# Patient Record
Sex: Female | Born: 1960
Health system: Southern US, Community
[De-identification: ages and names within clinical notes are randomized; demographics above are authoritative.]

## PROBLEM LIST (undated history)

## (undated) DIAGNOSIS — G2581 Restless legs syndrome: Secondary | ICD-10-CM

## (undated) DIAGNOSIS — F419 Anxiety disorder, unspecified: Secondary | ICD-10-CM

## (undated) DIAGNOSIS — M1990 Unspecified osteoarthritis, unspecified site: Secondary | ICD-10-CM

## (undated) DIAGNOSIS — M351 Other overlap syndromes: Secondary | ICD-10-CM

## (undated) HISTORY — DX: Other overlap syndromes: M35.1

## (undated) HISTORY — DX: Restless legs syndrome: G25.81

## (undated) HISTORY — DX: Anxiety disorder, unspecified: F41.9

## (undated) HISTORY — DX: Unspecified osteoarthritis, unspecified site: M19.90

---

## 2000-01-09 ENCOUNTER — Emergency Department (HOSPITAL_COMMUNITY): Admission: EM | Admit: 2000-01-09 | Discharge: 2000-01-09 | Payer: Self-pay | Admitting: Emergency Medicine

## 2000-01-09 ENCOUNTER — Encounter: Payer: Self-pay | Admitting: Emergency Medicine

## 2000-01-19 ENCOUNTER — Emergency Department (HOSPITAL_COMMUNITY): Admission: EM | Admit: 2000-01-19 | Discharge: 2000-01-19 | Payer: Self-pay | Admitting: Emergency Medicine

## 2001-12-05 ENCOUNTER — Emergency Department (HOSPITAL_COMMUNITY): Admission: EM | Admit: 2001-12-05 | Discharge: 2001-12-06 | Payer: Self-pay | Admitting: Emergency Medicine

## 2001-12-05 ENCOUNTER — Encounter: Payer: Self-pay | Admitting: Emergency Medicine

## 2001-12-06 ENCOUNTER — Encounter: Payer: Self-pay | Admitting: Emergency Medicine

## 2002-01-17 ENCOUNTER — Encounter: Payer: Self-pay | Admitting: Emergency Medicine

## 2002-01-17 ENCOUNTER — Emergency Department (HOSPITAL_COMMUNITY): Admission: EM | Admit: 2002-01-17 | Discharge: 2002-01-17 | Payer: Self-pay | Admitting: Emergency Medicine

## 2002-01-19 ENCOUNTER — Emergency Department (HOSPITAL_COMMUNITY): Admission: EM | Admit: 2002-01-19 | Discharge: 2002-01-19 | Payer: Self-pay | Admitting: Emergency Medicine

## 2002-05-20 ENCOUNTER — Emergency Department (HOSPITAL_COMMUNITY): Admission: EM | Admit: 2002-05-20 | Discharge: 2002-05-20 | Payer: Self-pay | Admitting: Emergency Medicine

## 2002-08-24 ENCOUNTER — Emergency Department (HOSPITAL_COMMUNITY): Admission: EM | Admit: 2002-08-24 | Discharge: 2002-08-24 | Payer: Self-pay | Admitting: Emergency Medicine

## 2002-08-24 ENCOUNTER — Encounter: Payer: Self-pay | Admitting: Emergency Medicine

## 2002-08-31 ENCOUNTER — Emergency Department (HOSPITAL_COMMUNITY): Admission: EM | Admit: 2002-08-31 | Discharge: 2002-08-31 | Payer: Self-pay | Admitting: Emergency Medicine

## 2002-09-17 ENCOUNTER — Encounter: Payer: Self-pay | Admitting: Orthopedic Surgery

## 2002-09-17 ENCOUNTER — Ambulatory Visit (HOSPITAL_COMMUNITY): Admission: RE | Admit: 2002-09-17 | Discharge: 2002-09-17 | Payer: Self-pay | Admitting: Orthopedic Surgery

## 2003-05-02 ENCOUNTER — Emergency Department (HOSPITAL_COMMUNITY): Admission: EM | Admit: 2003-05-02 | Discharge: 2003-05-02 | Payer: Self-pay | Admitting: Emergency Medicine

## 2003-07-07 ENCOUNTER — Emergency Department (HOSPITAL_COMMUNITY): Admission: EM | Admit: 2003-07-07 | Discharge: 2003-07-08 | Payer: Self-pay | Admitting: Emergency Medicine

## 2004-02-12 ENCOUNTER — Emergency Department (HOSPITAL_COMMUNITY): Admission: EM | Admit: 2004-02-12 | Discharge: 2004-02-12 | Payer: Self-pay | Admitting: Emergency Medicine

## 2004-08-07 ENCOUNTER — Emergency Department (HOSPITAL_COMMUNITY): Admission: EM | Admit: 2004-08-07 | Discharge: 2004-08-07 | Payer: Self-pay | Admitting: Emergency Medicine

## 2004-08-17 ENCOUNTER — Emergency Department (HOSPITAL_COMMUNITY): Admission: EM | Admit: 2004-08-17 | Discharge: 2004-08-17 | Payer: Self-pay | Admitting: Emergency Medicine

## 2004-09-23 ENCOUNTER — Emergency Department (HOSPITAL_COMMUNITY): Admission: EM | Admit: 2004-09-23 | Discharge: 2004-09-23 | Payer: Self-pay | Admitting: Emergency Medicine

## 2005-03-07 ENCOUNTER — Emergency Department (HOSPITAL_COMMUNITY): Admission: EM | Admit: 2005-03-07 | Discharge: 2005-03-07 | Payer: Self-pay | Admitting: Emergency Medicine

## 2005-05-19 ENCOUNTER — Emergency Department (HOSPITAL_COMMUNITY): Admission: EM | Admit: 2005-05-19 | Discharge: 2005-05-19 | Payer: Self-pay | Admitting: Emergency Medicine

## 2006-12-30 ENCOUNTER — Emergency Department (HOSPITAL_COMMUNITY): Admission: EM | Admit: 2006-12-30 | Discharge: 2006-12-31 | Payer: Self-pay | Admitting: Emergency Medicine

## 2007-09-02 ENCOUNTER — Emergency Department (HOSPITAL_COMMUNITY): Admission: EM | Admit: 2007-09-02 | Discharge: 2007-09-03 | Payer: Self-pay | Admitting: Certified Registered"

## 2007-09-03 ENCOUNTER — Ambulatory Visit: Payer: Self-pay | Admitting: Vascular Surgery

## 2007-09-03 ENCOUNTER — Ambulatory Visit (HOSPITAL_COMMUNITY): Admission: RE | Admit: 2007-09-03 | Discharge: 2007-09-03 | Payer: Self-pay | Admitting: Emergency Medicine

## 2007-09-03 ENCOUNTER — Encounter (INDEPENDENT_AMBULATORY_CARE_PROVIDER_SITE_OTHER): Payer: Self-pay | Admitting: Emergency Medicine

## 2007-09-06 ENCOUNTER — Emergency Department (HOSPITAL_COMMUNITY): Admission: EM | Admit: 2007-09-06 | Discharge: 2007-09-06 | Payer: Self-pay | Admitting: Emergency Medicine

## 2007-11-11 ENCOUNTER — Emergency Department (HOSPITAL_COMMUNITY): Admission: EM | Admit: 2007-11-11 | Discharge: 2007-11-11 | Payer: Self-pay | Admitting: Emergency Medicine

## 2008-02-01 ENCOUNTER — Emergency Department (HOSPITAL_COMMUNITY): Admission: EM | Admit: 2008-02-01 | Discharge: 2008-02-01 | Payer: Self-pay | Admitting: Emergency Medicine

## 2008-09-06 ENCOUNTER — Emergency Department (HOSPITAL_COMMUNITY): Admission: EM | Admit: 2008-09-06 | Discharge: 2008-09-06 | Payer: Self-pay | Admitting: Emergency Medicine

## 2009-07-27 ENCOUNTER — Emergency Department (HOSPITAL_COMMUNITY): Admission: EM | Admit: 2009-07-27 | Discharge: 2009-07-28 | Payer: Self-pay | Admitting: Emergency Medicine

## 2009-08-07 ENCOUNTER — Emergency Department (HOSPITAL_COMMUNITY): Admission: EM | Admit: 2009-08-07 | Discharge: 2009-08-07 | Payer: Self-pay | Admitting: Emergency Medicine

## 2009-12-02 ENCOUNTER — Emergency Department (HOSPITAL_COMMUNITY): Admission: EM | Admit: 2009-12-02 | Discharge: 2009-12-02 | Payer: Self-pay | Admitting: Emergency Medicine

## 2010-03-01 HISTORY — PX: OTHER SURGICAL HISTORY: SHX169

## 2010-03-03 ENCOUNTER — Emergency Department (HOSPITAL_COMMUNITY): Admission: EM | Admit: 2010-03-03 | Discharge: 2010-03-03 | Payer: Self-pay | Admitting: Emergency Medicine

## 2010-03-05 ENCOUNTER — Emergency Department (HOSPITAL_COMMUNITY): Admission: EM | Admit: 2010-03-05 | Discharge: 2010-03-05 | Payer: Self-pay | Admitting: Emergency Medicine

## 2010-08-13 LAB — DIFFERENTIAL
Basophils Absolute: 0 10*3/uL (ref 0.0–0.1)
Basophils Relative: 0 % (ref 0–1)
Basophils Relative: 0 % (ref 0–1)
Eosinophils Absolute: 0.1 10*3/uL (ref 0.0–0.7)
Monocytes Absolute: 1.1 10*3/uL — ABNORMAL HIGH (ref 0.1–1.0)
Monocytes Relative: 9 % (ref 3–12)
Neutro Abs: 6.5 10*3/uL (ref 1.7–7.7)
Neutrophils Relative %: 63 % (ref 43–77)
Neutrophils Relative %: 73 % (ref 43–77)

## 2010-08-13 LAB — POCT I-STAT, CHEM 8
Calcium, Ion: 0.94 mmol/L — ABNORMAL LOW (ref 1.12–1.32)
Calcium, Ion: 1.07 mmol/L — ABNORMAL LOW (ref 1.12–1.32)
Chloride: 105 mEq/L (ref 96–112)
Glucose, Bld: 121 mg/dL — ABNORMAL HIGH (ref 70–99)
HCT: 40 % (ref 36.0–46.0)
HCT: 42 % (ref 36.0–46.0)
Hemoglobin: 14.3 g/dL (ref 12.0–15.0)
Potassium: 3.8 mEq/L (ref 3.5–5.1)

## 2010-08-13 LAB — CBC
HCT: 40.9 % (ref 36.0–46.0)
Hemoglobin: 13.8 g/dL (ref 12.0–15.0)
MCH: 30.2 pg (ref 26.0–34.0)
MCHC: 33.5 g/dL (ref 30.0–36.0)
MCHC: 33.7 g/dL (ref 30.0–36.0)
RDW: 14 % (ref 11.5–15.5)

## 2010-08-20 LAB — URINALYSIS, ROUTINE W REFLEX MICROSCOPIC
Glucose, UA: NEGATIVE mg/dL
Leukocytes, UA: NEGATIVE
Specific Gravity, Urine: 1.031 — ABNORMAL HIGH (ref 1.005–1.030)
pH: 5.5 (ref 5.0–8.0)

## 2010-08-20 LAB — URINE MICROSCOPIC-ADD ON

## 2010-08-20 LAB — BASIC METABOLIC PANEL
Chloride: 100 mEq/L (ref 96–112)
Creatinine, Ser: 0.67 mg/dL (ref 0.4–1.2)
GFR calc Af Amer: >60 mL/min (ref >60)
GFR calc non Af Amer: >60 mL/min (ref >60)
Potassium: 3.5 mEq/L (ref 3.5–5.1)

## 2010-08-20 LAB — DIFFERENTIAL
Eosinophils Absolute: 0.2 10*3/uL (ref 0.0–0.7)
Lymphocytes Relative: 38 % (ref 12–46)
Lymphs Abs: 2.6 10*3/uL (ref 0.7–4.0)
Monocytes Relative: 10 % (ref 3–12)
Neutrophils Relative %: 49 % (ref 43–77)

## 2010-08-20 LAB — CBC
MCV: 87.3 fL (ref 78.0–100.0)
RBC: 4.51 MIL/uL (ref 3.87–5.11)
WBC: 6.9 10*3/uL (ref 4.0–10.5)

## 2010-09-22 ENCOUNTER — Inpatient Hospital Stay (HOSPITAL_COMMUNITY)
Admission: EM | Admit: 2010-09-22 | Discharge: 2010-09-25 | DRG: 313 | Disposition: A | Discharge status: Home / Self Care | Payer: Self-pay | Attending: Infectious Diseases | Admitting: Infectious Diseases

## 2010-09-22 ENCOUNTER — Emergency Department (HOSPITAL_COMMUNITY): Payer: Self-pay

## 2010-09-22 ENCOUNTER — Encounter: Payer: Self-pay | Admitting: Internal Medicine

## 2010-09-22 DIAGNOSIS — Z560 Unemployment, unspecified: Secondary | ICD-10-CM

## 2010-09-22 DIAGNOSIS — Z87891 Personal history of nicotine dependence: Secondary | ICD-10-CM

## 2010-09-22 DIAGNOSIS — E669 Obesity, unspecified: Secondary | ICD-10-CM | POA: Diagnosis present

## 2010-09-22 DIAGNOSIS — R079 Chest pain, unspecified: Secondary | ICD-10-CM

## 2010-09-22 DIAGNOSIS — M47817 Spondylosis without myelopathy or radiculopathy, lumbosacral region: Secondary | ICD-10-CM | POA: Diagnosis present

## 2010-09-22 DIAGNOSIS — J45909 Unspecified asthma, uncomplicated: Secondary | ICD-10-CM | POA: Diagnosis present

## 2010-09-22 DIAGNOSIS — Z7982 Long term (current) use of aspirin: Secondary | ICD-10-CM

## 2010-09-22 DIAGNOSIS — M47812 Spondylosis without myelopathy or radiculopathy, cervical region: Secondary | ICD-10-CM | POA: Diagnosis present

## 2010-09-22 DIAGNOSIS — R0789 Other chest pain: Principal | ICD-10-CM | POA: Diagnosis present

## 2010-09-22 DIAGNOSIS — H6690 Otitis media, unspecified, unspecified ear: Secondary | ICD-10-CM | POA: Diagnosis present

## 2010-09-22 LAB — PROTIME-INR: INR: 0.95 (ref 0.00–1.49)

## 2010-09-22 LAB — COMPREHENSIVE METABOLIC PANEL
ALT: 15 U/L (ref 0–35)
Albumin: 3.4 g/dL — ABNORMAL LOW (ref 3.5–5.2)
Alkaline Phosphatase: 52 U/L (ref 39–117)
BUN: 8 mg/dL (ref 6–23)
Chloride: 108 mEq/L (ref 96–112)
Glucose, Bld: 91 mg/dL (ref 70–99)
Potassium: 3.6 mEq/L (ref 3.5–5.1)
Total Bilirubin: 0.3 mg/dL (ref 0.3–1.2)

## 2010-09-22 LAB — POCT I-STAT, CHEM 8
Calcium, Ion: 1.13 mmol/L (ref 1.12–1.32)
Creatinine, Ser: 0.6 mg/dL (ref 0.4–1.2)
Glucose, Bld: 162 mg/dL — ABNORMAL HIGH (ref 70–99)
HCT: 39 % (ref 36.0–46.0)
Hemoglobin: 13.3 g/dL (ref 12.0–15.0)

## 2010-09-22 LAB — POCT CARDIAC MARKERS
CKMB, poc: 1.1 ng/mL (ref 1.0–8.0)
Myoglobin, poc: 52.2 ng/mL (ref 12–200)

## 2010-09-22 LAB — TSH: TSH: 1.522 u[IU]/mL (ref 0.350–4.500)

## 2010-09-22 LAB — HEMOGLOBIN A1C: Mean Plasma Glucose: 114 mg/dL (ref <117)

## 2010-09-22 LAB — CBC
HCT: 38.9 % (ref 36.0–46.0)
MCV: 86.3 fL (ref 78.0–100.0)
RBC: 4.51 MIL/uL (ref 3.87–5.11)
WBC: 7.6 10*3/uL (ref 4.0–10.5)

## 2010-09-22 LAB — CARDIAC PANEL(CRET KIN+CKTOT+MB+TROPI): CK, MB: 2.7 ng/mL (ref 0.3–4.0)

## 2010-09-22 LAB — BRAIN NATRIURETIC PEPTIDE: Pro B Natriuretic peptide (BNP): 44 pg/mL (ref 0.0–100.0)

## 2010-09-22 LAB — CK TOTAL AND CKMB (NOT AT ARMC)
Relative Index: INVALID (ref 0.0–2.5)
Total CK: 74 U/L (ref 7–177)

## 2010-09-22 LAB — DIFFERENTIAL
Lymphocytes Relative: 23 % (ref 12–46)
Lymphs Abs: 1.8 10*3/uL (ref 0.7–4.0)
Neutrophils Relative %: 69 % (ref 43–77)

## 2010-09-22 NOTE — H&P (Signed)
Hospital Admission Note Date: 09/22/2010  Patient name: Sonya Smith Medical record number: [id number] Date of birth: 13-Jan-1961 Age: 50 y.o. Gender: female PCP: No primary provider on file.  Medical Service: IM teaching service  Attending physician: Dr. Darlina Sicilian    Pager: Resident (R2/R3): Dr. Scot Dock    Pager: [phone] Resident (R1): Dr. Allena Katz     Pager: [phone]  Chief Complaint: Chest pain  History of Present Illness: 50 y/o woman with h/o arthritis of back, multiple ED visit for chest pain, back pain, SOB ruled out for ACS in the past, comes to the ED c/o episodic chest pain since 3 days, each episode lasting about few minutes. The pain has occurred mostly when she is lying down. It is located in the center of chest and under her left breast, feels like heaviness and occasionally sharp, radiating to back sometimes, aggravated by deep inspiration and moving in a certain way. The chest pain is associated with momentary SOB and migrates to upper back. She had cough productive of greenish sputum, scratchy throat, nausea and night sweats since last 2 weeks. This seems to be getting better now. She has also been having worsening pedal edema and wakes up sometimes in the middle of night short of breath in past few months.  She has also been in emotional stress since last 1 week after her very close friend died. She had the pain under her left breast in the past multiple time but this is the first time she had central chest pain and it feels different from her previous pain so she decided to come to the Ed.   She does not have a pcp, never been admitted for chest pains and never seen a cardiologist.   Current Outpatient Prescriptions  Medication Sig Dispense Refill  . diazepam (VALIUM) 10 MG tablet Take 10 mg by mouth every 6 (six) hours as needed.    Took few tablets from her friend     . HYDROcodone-acetaminophen (NORCO) 10-325 MG per tablet Take 1 tablet by mouth every 6 (six) hours as needed.  Has a old prescription which he received from the ED in past   Has some left over from a prescription she got from Ed in past    . ibuprofen (ADVIL,MOTRIN) 100 MG tablet Take 100 mg by mouth every 6 (six) hours as needed.          Allergies: Review of patient's allergies indicates not on file.  Past Medical History  Diagnosis Date  . Chest pain     multiple ED visit, no objective evidence of cardiac or pulmonary causes  of chest pain in past  . Arthritis     of cervical and lumbar spine  . Asthma     undiagnosed, was told by a physician in the ED, patient does not take any inhalers at home.     Past Surgical History  Procedure Date  . Peri tonsillar abscess 10/11    drainage    Family History  Problem Relation Age of Onset  . Heart disease Mother 10  . Diabetes Mother   . Heart disease Maternal Aunt 40    History   Social History  . Marital Status: Divorced    Spouse Name: N/A    Number of Children: N/A  . Years of Education: N/A   Occupational History  . Not on file.   Social History Main Topics  . Smoking status: Not on file  . Smokeless tobacco: Not on file  .  Alcohol Use:   . Drug Use:   . Sexually Active:    Other Topics Concern  . Not on file   Social History Narrative   Lives in [place], [place] with her boy friend who is disabled. She takes care of him. She has a daughter who is independent and provides her money for medicines. 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  No health insurance. Drinks alcohol (beer or wine 1-2 times a week), used to smoke 1.5 ppd for years but quit completely 7 years ago. Smokes marijuana 2-3 times a month. Denies cocaine or other illicit drug use.     Review of Systems:  A comprehensive ROS is negative except as per HPI  Physical Exam: T- 97.7, P- 80-90, BP- 130-140/70-80, O2- 99% RA General appearance: alert, cooperative and no distress Head: Normocephalic, without  obvious abnormality, atraumatic Eyes: conjunctivae/corneas clear. PERRL, EOM's intact. Fundi benign. Neck: no adenopathy, no carotid bruit, no JVD, supple, symmetrical, trachea midline and thyroid not enlarged, symmetric, no tenderness/mass/nodules Lungs: clear to auscultation bilaterally Chest wall: left sided chest wall tenderness Heart: regular rate and rhythm, S1, S2 normal, no murmur, click, rub or gallop Abdomen: soft, non-tender; bowel sounds normal; no masses,  no organomegaly Extremities: edema 1+ b/l Pulses: 2+ and symmetric Neurologic: Grossly normal  Lab results:  CBC:    Component Value Date/Time   WBC 7.6 09/22/2010 1024   HGB 13.3 09/22/2010 1033   HCT 39.0 09/22/2010 1033   PLT 209 09/22/2010 1024   MCV 86.3 09/22/2010 1024   NEUTROABS 5.3 09/22/2010 1024   LYMPHSABS 1.8 09/22/2010 1024   MONOABS 0.4 09/22/2010 1024   EOSABS 0.1 09/22/2010 1024   BASOSABS 0.1 09/22/2010 1024    TCO2                                     23                0-100            mmol/L  Ionized Calcium                          1.13              1.12-1.32        mmol/L  Hemoglobin (HGB)                         13.3              12.0-15.0        g/dL  Hematocrit (HCT)                         39.0              36.0-46.0        %  Sodium (NA)                              140               135-145          mEq/L  Potassium (K)  3.9               3.5-5.1          mEq/L  Chloride                                 105               96-112           mEq/L  Glucose                                  162        h      70-99            mg/dL  BUN                                      11                6-23             mg/dL  Creatinine                               0.6               0.4-1.2          Mg/dL   D-Dimer, Fibrin Derivatives              0.40              0.00-0.48        ug/mL-FEU   CKMB, POC                                1.1               1.0-8.0          ng/mL  Troponin I,  POC                          <0.05             0.00-0.09        ng/mL  Myoglobin, POC                           52.2              12-200           ng/mL  Imaging results:  CXR:( 1 view) no active disease  Other results:  12 lead EKG- NSR, normal axis, no ST/T changes, normal intervals.   Assessment & Plan by Problem:  50 y/o with cardiovascular risk factors of strong family history and h/o smoking, arthritis is admitted for atypical chest pain  1. Chest pain - given history and physical exam, musculoskeletal pain induced by coughing and arthritis is most likely, but there could also be a component of GERD and pleuritis. ACS is unlikely based on history, PE is unlikely given no risk factor and negative d-dimer. - will observe in telemetry for 24 hrs - cycle cardiac enzymes and EKG -  check FLP, HbA1C, TSH for risk stratification - tylenol, ibuprofen and NTG prn for chest pain. Consider narcotics if pain not controlled with maximal dose of NSAIDS - protonix for possible GERD - Given h/o SOB, peripheral edema and orthopnea, will check 2D echo for EF assessment although CHF unlikely as the cause of CP this time given no pulmonary edema on CXR - discharge if Ce and EKG negative for ACS changes tomorrow .  2. DVT Px: lovenox

## 2010-09-23 LAB — LIPID PANEL
HDL: 32 mg/dL — ABNORMAL LOW (ref >39)
Total CHOL/HDL Ratio: 4.9 RATIO
Triglycerides: 171 mg/dL — ABNORMAL HIGH (ref <150)
VLDL: 34 mg/dL (ref 0–40)

## 2010-09-23 LAB — URINE DRUGS OF ABUSE SCREEN W ALC, ROUTINE (REF LAB)
Cocaine Metabolites: NEGATIVE
Ethyl Alcohol: <10 mg/dL (ref <10)
Marijuana Metabolite: POSITIVE — AB
Opiate Screen, Urine: NEGATIVE
Phencyclidine (PCP): NEGATIVE
Propoxyphene: NEGATIVE

## 2010-09-23 LAB — HIV ANTIBODY (ROUTINE TESTING W REFLEX): HIV: NONREACTIVE

## 2010-09-23 LAB — CARDIAC PANEL(CRET KIN+CKTOT+MB+TROPI): Total CK: 84 U/L (ref 7–177)

## 2010-09-24 ENCOUNTER — Inpatient Hospital Stay (HOSPITAL_COMMUNITY): Payer: Self-pay

## 2010-09-24 LAB — BASIC METABOLIC PANEL
BUN: 13 mg/dL (ref 6–23)
Calcium: 8.7 mg/dL (ref 8.4–10.5)
Creatinine, Ser: 0.79 mg/dL (ref 0.4–1.2)
GFR calc Af Amer: >60 mL/min (ref >60)

## 2010-09-25 ENCOUNTER — Inpatient Hospital Stay (HOSPITAL_COMMUNITY): Payer: Self-pay

## 2010-09-25 DIAGNOSIS — R079 Chest pain, unspecified: Secondary | ICD-10-CM

## 2010-09-25 MED ORDER — TECHNETIUM TC 99M TETROFOSMIN IV KIT
30.0000 | PACK | Freq: Once | INTRAVENOUS | Status: AC | PRN
  Administered 2010-09-25: 30 via INTRAVENOUS

## 2010-09-25 MED ORDER — TECHNETIUM TC 99M TETROFOSMIN IV KIT
30.0000 | PACK | Freq: Once | INTRAVENOUS | Status: AC | PRN
  Administered 2010-09-24: 30 via INTRAVENOUS

## 2010-09-25 NOTE — Consult Note (Signed)
Sonya Smith, Sonya Smith                ACCOUNT NO.:  [id number]  MEDICAL RECORD NO.:  [id number]           PATIENT TYPE:  I  LOCATION:  3741                         FACILITY:  [facility]  PHYSICIAN:  Armanda Magic, M.D.     DATE OF BIRTH:  06-Jan-1961  DATE OF CONSULTATION:  09/23/2010 DATE OF DISCHARGE:                                CONSULTATION   REFERRING PHYSICIAN:  Fransisco Hertz, MD.  CHIEF COMPLAINT:  Chest pain.  HISTORY OF PRESENT ILLNESS:  This is a 50 year old obese white female with multiple emergency room visits for chest pain, back pain in the past, and shortness of breath for which she is ruled out for acute coronary syndrome in the past.  She again presented to the emergency room, complaining of episodic chest pain for the past 3 days, each episode lasting a few minutes.  She said she forgot to tell the admitting doctor, but she had actually been moving furniture for a friend for several days prior to the onset of the chest pain.  The chest pain is nonexertional.  She has two types of pain, one located in the midsternal area, which is a pressure sensation that then feels like a pulling sensation if she moves her head from left to right and is much worse with palpation over the chest wall and deep inspiration.  The other chest pain occurs on the left breast and it is sharp and stabbing in nature.  The pain is aggravated by deep inspiration and moving certain ways.  It is associated with occasional shortness of breath, but she has also had a cough productive of green sputum, scratchy throat, nausea, and night sweats for the past 2 weeks.  She was then under a lot of stress over the past week with a very close friend dying.  Her cardiac risk factors include obesity.  Remote history of tobacco use and family history in early age.  PAST MEDICAL HISTORY:  Includes, chronic chest pain syndrome, arthritis, asthma.  PAST SURGICAL HISTORY:  Peritonsillar abscess with  drainage.  FAMILY HISTORY:  Her mother had onset of coronary artery disease with an MI in her 71s, she is diabetic.  She has a maternal aunt who had CAD in her 97s as well.  SOCIAL HISTORY:  She is divorced.  She used to smoke 1-1/2 packs per day for several years, but quit completely 7 years ago.  She smokes marijuana two to three times a month.  She denies any cocaine or other illicit drug use.  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  She lives in [place] with her boyfriend who is disabled.  She actually has a daughter who is independent and provides her money for medicines.  MEDICATIONS ON ADMISSION:  Include: 1. Diazepam 10 mg q.6 h. p.r.n. 2. Hydrocodone 10/325 mg 1 tablet every 6 hours as needed. 3. Ibuprofen p.r.n.  REVIEW OF SYSTEMS:  Otherwise stated in HPI is negative.  PHYSICAL EXAM:  VITAL SIGNS:  Her blood pressure is 107/67, pulse 79. She is afebrile. GENERAL:  She is  a well-developed, obese, white female, in no acute distress. HEENT:  Benign. NECK:  Supple without lymphadenopathy.  Carotid upstrokes are +2 bilaterally.  No bruits. LUNGS:  Clear to auscultation throughout. HEART:  Regular rate and rhythm.  No murmurs, rubs, or gallops.  Normal S1 and S2. ABDOMEN:  Soft, nontender, nondistended, with active bowel sounds.  No hepatosplenomegaly. EXTREMITIES:  No edema.  CBC:  White cell count 7.7, hemoglobin 13.1, hematocrit 38.9, platelet count 209, D-dimer 0.4.  Cardiac point-of-care marker is negative x1. CPK is 74, 83, 84.  MB 2.9, 2.7, 2.6.  Troponin 0.01, 0.01, 0.02. Sodium 136, potassium 3.6, chloride 108, CO2 23, glucose 91, BUN 8, creatinine 0.62.  LFTs are all normal.  Albumin 3.4.  BNP 44. Hemoglobin A1c 5.6.  TSH 1.522.  Lipid panel:  Total cholesterol 156, triglycerides 171, HDL 32, LDL 90.  Chest x-ray shows no active disease. EKG shows normal sinus rhythm with no ST  changes.  ASSESSMENT: 1. Atypical chest pain with nonischemic EKG.  Apparently, she had been     helping a friend moving furniture several days prior to the onset     of chest pain.  She has multiple types of pain.  One pain is     midsternal and heavy that is worse with movement of the arms and     movement of her head, which feels like a pulling sensation.  She     has another pain under her left breast, which is sharp and stabbing     with no radiation.  She is short of breath occasionally with chest     pain, but no nausea, vomiting, diaphoresis.  Her chest wall is     extremely tender to palpation over the sternum and worsens with     deep breathing.  She does have cardiac risk factors including     obesity, family history at an early age, and remote tobacco abuse,     but her pain is extremely atypical for underlying cardiac disease. 2. Obesity. 3. Asthma. 4. Arthritis.  PLAN:  NPO after midnight, today nuclear stress test, we will check a 2- D echocardiogram to assess LV function.     Armanda Magic, M.D.     TT/MEDQ  D:  09/23/2010  T:  09/24/2010  Job:  [id number]  Electronically Signed by Armanda Magic M.D. on 09/25/2010 01:50:32 PM

## 2010-09-30 LAB — BENZODIAZEPINE, QUANTITATIVE, URINE
Flurazepam GC/MS Conf: NEGATIVE NG/ML
Lorazepam UR QT: NEGATIVE NG/ML

## 2010-10-09 ENCOUNTER — Encounter: Payer: Self-pay | Admitting: Internal Medicine

## 2010-10-09 ENCOUNTER — Ambulatory Visit (INDEPENDENT_AMBULATORY_CARE_PROVIDER_SITE_OTHER): Payer: Self-pay | Admitting: Internal Medicine

## 2010-10-09 DIAGNOSIS — F419 Anxiety disorder, unspecified: Secondary | ICD-10-CM

## 2010-10-09 DIAGNOSIS — R0789 Other chest pain: Secondary | ICD-10-CM

## 2010-10-09 DIAGNOSIS — F411 Generalized anxiety disorder: Secondary | ICD-10-CM

## 2010-10-09 HISTORY — DX: Anxiety disorder, unspecified: F41.9

## 2010-10-09 MED ORDER — LORAZEPAM 1 MG PO TABS
ORAL_TABLET | ORAL | Status: AC
Start: 2010-10-09 — End: 2010-12-09

## 2010-10-09 NOTE — Assessment & Plan Note (Signed)
She seems to be jittery and in anxiety. She attributes this due to unrelenting constant pain for about last 2 months and also passing out of her close friend recently and one of her other friends whom she lives with, having disability and herself taking care of him. Discussed with her about the possible diagnosis anxiety and less likely depression as she's not having any anhedonia, increased or decreased appetite lately and also has interest in daily activities. Dr. Aundria Rud counseled her and explained to her about this possible diagnosis in detail and the treatment plan and followup. We'll prescribe her Ativan 1 mg at bedtime and 1 mg during the day when necessary for anxiety. We'll give her 60 tablets with one refill and see her back in one month to reevaluate her. If her pain syndrome doesn't go away with anxiety treatment, will likely have her for psychiatric evaluation.

## 2010-10-09 NOTE — Patient Instructions (Signed)
Please make a followup appointment in 4-6 weeks. Please take Ativan 1 mg tablet one hour before going to bed at night and one tablet as needed during the day for anxiety.

## 2010-10-09 NOTE — Assessment & Plan Note (Signed)
Multiple ED visits and hospitalization in April 2012, ruled out for ACS after EKG, enzymes and Myoview be normal. She was reassured and the hospital admission about her not having a serious life-threatening pain syndromes. Her pain is most likely musculoskeletal origin do to her taking care of her disabled friend daily and also not getting enough sleep for past 2-3 months which puts her at high risk of having muscle pain due to incomplete sleep and decreased rest time.

## 2010-10-09 NOTE — Progress Notes (Signed)
  Subjective:    Patient ID: Sonya Smith, female    DOB: Jan 24, 1961, 50 y.o.   MRN: [id number]  HPI Ms. Nessler is a 50 year old woman with no significant past medical history who comes for a hospital followup visit. She was discharged from hospital in April after admission for atypical chest pain being ruled out for acute coronary syndrome after having a normal Myoview perfusion imaging and normal enzymes. Today she complains of similar chest pain in the midsternal and a left breast. She is a complaints of pain in her bilateral arms and back of the neck. This pain is getting worse after the hospital discharge. She also complains of not having good sleep for last 2-3 months and being in anxiety due to her and her friend died recently and she lives with her friend who was involved in a car wreck in 2004 and had cervical spine injury and some residual nerve damage due to which she has to take care of him. She also says that she has recently become more tearful but has not lost interest in daily activities and watches TV and goes out of occasionally. Denies any fever, chills, night sweats, shortness of breath, abdominal pain, diarrhea, urinary abnormalities.    Review of Systems As per history of present illness    Objective:   Physical Exam    Constitutional: Vital signs reviewed.  Patient is a well-developed and well-nourished , tearful at times. Alert and oriented x3.  Head: Normocephalic and atraumatic Ear: TM normal bilaterally Mouth: no erythema or exudates, MMM Eyes: PERRL, EOMI, conjunctivae normal, No scleral icterus.  Neck: Supple, Trachea midline normal ROM, No JVD, mass, thyromegaly, or carotid bruit present.  Cardiovascular: RRR, S1 normal, S2 normal, no MRG, pulses symmetric and intact bilaterally Pulmonary/Chest: CTAB, no wheezes, rales, or rhonchi Abdominal: Soft. Non-tender, non-distended, bowel sounds are normal, no masses, organomegaly, or guarding present.  GU: no CVA  tenderness Musculoskeletal: No joint deformities, erythema, or stiffness, ROM full and no nontender. Paraspinal cervical Tenderness in the back of the neck.   Neurological: A&O x3, Strenght is normal and symmetric bilaterally, cranial nerve II-XII are grossly intact, no focal motor deficit, sensory intact to light touch bilaterally.  Skin: Warm, dry and intact. No rash, cyanosis, or clubbing.       Assessment & Plan:

## 2010-10-17 NOTE — Consult Note (Signed)
   NAME:  Sonya Smith, Sonya Smith                          ACCOUNT NO.:  [id number]   MEDICAL RECORD NO.:  [id number]                   PATIENT TYPE:  EMS   LOCATION:  ED                                   FACILITY:  [facility]   PHYSICIAN:  Jefry H. Pollyann Kennedy, M.D.                DATE OF BIRTH:  [birth year]   DATE OF CONSULTATION:  01/19/2002  DATE OF DISCHARGE:                         OTORHINOLARYNGOLOGY CONSULTATION   REASON FOR CONSULTATION:  Possible peritonsillar abscess versus angioedema.   HISTORY OF PRESENT ILLNESS:  This is a 50 year old lady who started having  severe sore throat and cough with congestion last Saturday.  Tuesday, she  was evaluated at the emergency department, diagnosed with pneumonia and  started on Keflex.  She reports having fever but does not know how high.  She has not had problems like this before.  She is having some difficulty  swallowing and some soreness of her throat.  She was treated earlier today  in the emergency department for presumed angioedema and reportedly was  improving.  There was no trismus, no difficulty breathing and no difficulty  swallowing.   PHYSICAL EXAMINATION:  GENERAL APPEARANCE:  She is a somewhat ill-appearing  lady in no respiratory distress.  VITAL SIGNS:  Vital signs are not recorded on the emergency department  paperwork.  HEENT: She has no palpable cervical adenopathy.  There is no trismus. There  is slight trismus of the soft palate on the left side but without any edema  of the uvula or the soft palate mucosa.  The tonsils are completely normal  to inspection without any signs of inflammatory disease.  The left lower  posterior molar is completely rotten, infected and with purulent exudate at  the surface and is exquisitely tender to percussion. The left upper  posterior molars are also severely diseased.  Nasal examination is clear.   IMPRESSION:  Severe dental disease with infected molar and possible  periapical abscess with  extension into the pterygoid musculature.  Recommend  consultation with an oral surgeon.  No other recommendations to make at this  time.                                               Jefry H. Pollyann Kennedy, M.D.    JHR/MEDQ  D:  01/19/2002  T:  01/19/2002  Job:  [id number]

## 2010-12-15 NOTE — Discharge Summary (Signed)
Sonya Smith, Sonya Smith                ACCOUNT NO.:  [id number]  MEDICAL RECORD NO.:  [id number]           PATIENT TYPE:  I  LOCATION:  3741                         FACILITY:  [facility]  PHYSICIAN:  Fransisco Hertz, M.D.  DATE OF BIRTH:  [birth year]  DATE OF ADMISSION:  09/22/2010 DATE OF DISCHARGE:  09/25/2010                              DISCHARGE SUMMARY   PRIMARY CARE PHYSICIAN:  None.  DISCHARGE DIAGNOSES: 1. Atypical chest pain.  No EKG changes, negative cardiac enzymes,     normal myocardial perfusion scan with ejection fraction of 61%.     She has multiple ED visits for chest pain, has been ruled out for     myocardial infarction and pulmonary embolism multiple times. 2. History of arthritis of cervical and lumbar spine, no diagnostic     tests or evidence to support the diagnosis. 3. History of asthma, undiagnosed, told by the physician in the ED,     and was prescribed one inhaler, but at present, does not have any albuterol inhaler at home. 4. History of peritonsillar abscess drainage, last one done in October     2011.  DISCHARGE MEDICATIONS: 1. Aspirin 325 mg 1-2 tablets every 6 hours as needed. 2. Tylenol 325 mg 1-2 tablets every 6 hours as needed for pain. 3. Omeprazole 20 mg 1 capsule daily for 1 month. 4. Tramadol 50 mg 1 tablet by mouth twice daily as needed for pain.  DISPOSITION AND FOLLOWUP:  Sonya Smith is to be discharged in a relatively stable condition.  She is to be followed up at [facility] with Dr. Lyn Hollingshead on Oct 09, 2010, at 3:45 p.m. and need to reassess her pain and explained her about the characteristics of chest pain and different disease to relieve her anxiety about having serious problem with her heart.  CONSULTATIONS:  Cardiology, Dr. Armanda Magic, who advised to perform a 2- D echocardiogram and myocardial perfusion imaging scan.  PROCEDURES PERFORMED: 1. Chest x-ray portable.  Impression:  No active disease on September 22, 2010. 2. Myocardial perfusion imaging 2-day test, September 24, 2010, and September 25, 2010.  Impression:  No evidence of pharmacological-induced     ischemia, decreased uptake lung anterior wall is probably related     to breast attenuation, normal wall motion with ejection fraction of     61%. 3. A 2-D echo results pending.  CHIEF COMPLAINT:  Chest pain.  HISTORY OF PRESENT ILLNESS:  A 50 year old woman with history of arthritis of back, multiple ED visits for chest pain, back pain, and shortness of breath, ruled out for ACS in the past, comes to the ED complaining of episodic chest pain since 3 days, each episode lasting about few minutes.  The pain has occurred mostly when she is lying down. It is located in the center of the chest and her left breast, feels like heaviness and occasional sharp, radiating to the back sometimes, aggravated by deep inspiration, moving in a certain way.  The chest pain is associated with momentary shortness of breath and migrates to the upper back.  She had cough productive of greenish sputum, scratchy throat, nausea, and night sweats since last 2 weeks.  This seems to be getting better now.  She has also been having worsening pedal edema and wakes up sometimes in the middle of the night, short of breath in the past few months.  She has also been in emotional stress since the last 1 week after her very close friend died.  She had pain on her left breast in the past multiple times, but this is the first time she had central chest pain and feels it different from the previous pain, so she decided to come to the ED.  She does not have a PCP, never been admitted for chest pain, and never seen a cardiologist.  PHYSICAL EXAMINATION:  VITAL SIGNS:  On admission, temperature 97.7, pulse 80, blood pressure 130/70, O2 sat 99% on room air. GENERAL APPEARANCE:  Alert, cooperative, and in no acute distress. HEAD:  Normocephalic without obvious abnormality,  atraumatic. EYES:  Conjunctivae, cornea clear.  PERRLA, EOMI is intact.  Fundi benign. NECK:  No adenopathy.  No carotid bruit.  No JVD, supple, symmetrical. Trachea midline.  Thyroid not enlarged, symmetric.  No tenderness, masses, nodules. LUNGS:  Clear to auscultation bilaterally. CHEST:  Left-sided chest wall tenderness. HEART:  Regular rate and rhythm.  S1, S2 normal.  No murmur, click, rub, or gallop. ABDOMEN:  Soft, nontender.  Bowel sounds normal.  No masses, no organomegaly. EXTREMITIES:  Edema 1+ bilaterally.  Pulses are 2+ and  symmetric bilaterally. NEUROLOGIC:  Grossly normal.  LABS ON ADMISSION:  CBC:  WBC 7.6, absolute neutrophil 5.3, hemoglobin 13.3, hematocrit 39.0, platelets 219, MCV 86.3.  Chemistry:  Sodium 140, potassium 3.9, bicarb 23, chloride 105, glucose 162, BUN 11, creatinine 0.6, D-dimer 0.40.  Point-of-care cardiac markers negative.  A 12-lead EKG normal sinus rhythm, normal axis, no ST-T changes, normal intervals.  HOSPITAL COURSE BY PROBLEM: 1. Chest pain.  Sonya Smith had multiple ED visits for chest pain, had     been ruled out for ACS and PE in multiple times with CT angiogramof the chest.  She came at this time with atypical chest pain.     History and presentation was not concerning for ACS even though, 12-     lead EKG was done and cycled cardiac enzymes x3, which were     negative and 12-lead EKG also did not show any changes concerning     for ACS.  She continued to have pain during the hospital stay and     so, Cardiology was consulted to rule out ischemic changes.  Dr.     Mayford Knife did the consult and she ordered a 2-D echocardiogram and     myocardial perfusion imaging to rule out any ischemic changes,     which came back normal.  The 2-D echo is pending at the time of     discharge, which will be followed in the outpatient clinic with the     result.  She was explained extensively for her pain by me for about     30 minutes at a stretch.  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  I explained     about benign nature of  her pain at this point of time, considering     all the tests being negative and normal, and she was understanding     with follow up in the Outpatient Clinic at [facility] and     will give her educational pamphlets about regarding chest pain and     management. 2. Shortness of breath.  The patient complains of being short of     breath intermittently and has a diagnosis of asthma made in the ED,     but never had a PFT.  If she continues to have shortness of breath,     the patient will be advised to try albuterol inhaler and also do a     pulmonary function test as an outpatient.  We will send her home at     this point of time without any albuterol inhalers as she was not     significantly short of breath during the hospital time.  DISCHARGE LABS:  BMET:  Sodium 138, potassium 4.1, chloride 104, bicarb 27, BUN 13, creatinine 0.79, calcium 8.7, glucose 135, GFR more than 60 on September 24, 2010.  UDS positive for marijuana and benzodiazepines. Lipid profile; total cholesterol 156, triglyceride 171, HDL 32, LDL 90, VLDL 34.  HIV antibody nonreactive.  TSH 1.522, HbA1c 5.6, BNP 44.  She is to be discharged in relatively stable condition.  She is to be followed up at [facility] with Dr. Allena Katz on Oct 09, 2010, at 3:45 p.m.    ______________________________ Lyn Hollingshead, MD   ______________________________ Fransisco Hertz, M.D.    RP/MEDQ  D:  09/25/2010  T:  09/26/2010  Job:  [id number]  Electronically Signed by Lyn Hollingshead MD on 10/18/2010 08:23:06 PM Electronically Signed by Lina Sayre M.D. on 12/15/2010 03:23:47 PM

## 2011-03-04 LAB — URINALYSIS, ROUTINE W REFLEX MICROSCOPIC
Glucose, UA: NEGATIVE
Hgb urine dipstick: NEGATIVE
Specific Gravity, Urine: 1.016
pH: 7

## 2011-03-04 LAB — POCT I-STAT, CHEM 8
BUN: 11
Calcium, Ion: 1.15
Chloride: 105
HCT: 44
Sodium: 138
TCO2: 30

## 2011-03-04 LAB — WET PREP, GENITAL
WBC, Wet Prep HPF POC: NONE SEEN
Yeast Wet Prep HPF POC: NONE SEEN

## 2011-03-04 LAB — GC/CHLAMYDIA PROBE AMP, GENITAL
Chlamydia, DNA Probe: NEGATIVE
GC Probe Amp, Genital: NEGATIVE

## 2011-03-04 LAB — POCT PREGNANCY, URINE: Preg Test, Ur: NEGATIVE

## 2011-03-16 LAB — CBC
HCT: 43.8
Platelets: 203
WBC: 10

## 2011-03-16 LAB — I-STAT 8, (EC8 V) (CONVERTED LAB)
Bicarbonate: 27.8 — ABNORMAL HIGH
Glucose, Bld: 101 — ABNORMAL HIGH
Operator id: 272551
Potassium: 3.9
Sodium: 138
TCO2: 29
pCO2, Ven: 45
pH, Ven: 7.399 — ABNORMAL HIGH

## 2011-03-16 LAB — HEPATIC FUNCTION PANEL
ALT: 15
AST: 16
Albumin: 3.6
Alkaline Phosphatase: 67
Total Protein: 6.8

## 2011-03-16 LAB — DIFFERENTIAL
Eosinophils Relative: 2
Lymphocytes Relative: 28
Lymphs Abs: 2.8
Neutro Abs: 6.3
Neutrophils Relative %: 64

## 2011-03-16 LAB — POCT CARDIAC MARKERS
CKMB, poc: <1 — ABNORMAL LOW
Myoglobin, poc: 65
Operator id: 272551
Troponin i, poc: <0.05

## 2011-03-16 LAB — PROTIME-INR: INR: 0.9

## 2011-04-30 ENCOUNTER — Emergency Department (HOSPITAL_COMMUNITY)
Admission: EM | Admit: 2011-04-30 | Discharge: 2011-05-01 | Disposition: A | Discharge status: Home / Self Care | Payer: Self-pay | Attending: Emergency Medicine | Admitting: Emergency Medicine

## 2011-04-30 ENCOUNTER — Encounter (HOSPITAL_COMMUNITY): Payer: Self-pay | Admitting: Emergency Medicine

## 2011-04-30 ENCOUNTER — Emergency Department (HOSPITAL_COMMUNITY): Payer: Self-pay

## 2011-04-30 DIAGNOSIS — J45909 Unspecified asthma, uncomplicated: Secondary | ICD-10-CM | POA: Insufficient documentation

## 2011-04-30 DIAGNOSIS — R079 Chest pain, unspecified: Secondary | ICD-10-CM | POA: Insufficient documentation

## 2011-04-30 DIAGNOSIS — W1809XA Striking against other object with subsequent fall, initial encounter: Secondary | ICD-10-CM | POA: Insufficient documentation

## 2011-04-30 DIAGNOSIS — M129 Arthropathy, unspecified: Secondary | ICD-10-CM | POA: Insufficient documentation

## 2011-04-30 DIAGNOSIS — S20229A Contusion of unspecified back wall of thorax, initial encounter: Secondary | ICD-10-CM | POA: Insufficient documentation

## 2011-04-30 DIAGNOSIS — T148XXA Other injury of unspecified body region, initial encounter: Secondary | ICD-10-CM

## 2011-04-30 DIAGNOSIS — M545 Low back pain, unspecified: Secondary | ICD-10-CM | POA: Insufficient documentation

## 2011-04-30 NOTE — ED Notes (Signed)
PT. TRIPPED AND FELL AT HOME YESTERDAY EVENING ,  REPORTS LOW BACK PAIN WITH MOVEMENT /CERTAIN POSITIONS . NO LOC .

## 2011-05-01 ENCOUNTER — Emergency Department (HOSPITAL_COMMUNITY): Payer: Self-pay

## 2011-05-01 MED ORDER — HYDROCODONE-ACETAMINOPHEN 5-325 MG PO TABS
2.0000 | ORAL_TABLET | Freq: Once | ORAL | Status: AC
  Administered 2011-05-01: 2 via ORAL
  Filled 2011-05-01: qty 2

## 2011-05-01 MED ORDER — HYDROCODONE-ACETAMINOPHEN 7.5-500 MG/15ML PO SOLN: 7.5000 mL | Freq: Four times a day (QID) | ORAL | Status: AC | PRN

## 2011-05-01 NOTE — ED Provider Notes (Signed)
History     CSN: [id number] Arrival date & time: 04/30/2011 10:13 PM   First MD Initiated Contact with Patient 05/01/11 0021      Chief Complaint  Patient presents with  . Fall    (Consider location/radiation/quality/duration/timing/severity/associated sxs/prior treatment) Patient is a 50 y.o. female presenting with fall. The history is provided by the patient.  Fall The accident occurred yesterday. The fall occurred while walking. Distance fallen: Ground level fall bumped into furniture injuring lower back. She landed on carpet. There was no blood loss. Point of impact: Lower back midline. Pain location: Low back. The pain is moderate. She was ambulatory at the scene. There was no entrapment after the fall. There was no drug use involved in the accident. There was no alcohol use involved in the accident. Pertinent negatives include no visual change, no fever, no numbness, no abdominal pain, no bowel incontinence, no nausea, no vomiting, no hematuria, no headaches, no hearing loss, no loss of consciousness and no tingling. Exacerbated by: Movement, twisting and palpation of that area. She has tried nothing for the symptoms.   pain located in midline lower lumbar, sharp in quality, moderate severity, no bruising or bleeding. No history of back injury in the past. No head or neck injury. Patient denies any other pain injury or trauma  Past Medical History  Diagnosis Date  . Chest pain     multiple ED visit, no objective evidence of cardiac or pulmonary causes  of chest pain in past  . Arthritis     of cervical and lumbar spine  . Asthma     undiagnosed, was told by a physician in the ED, patient does not take any inhalers at home.     Past Surgical History  Procedure Date  . Peri tonsillar abscess 10/11    drainage    Family History  Problem Relation Age of Onset  . Heart disease Mother 35  . Diabetes Mother   . Heart disease Maternal Aunt 40    History  Substance Use Topics   . Smoking status: Former Smoker    Quit date: 10/09/2003  . Smokeless tobacco: Not on file  . Alcohol Use: No    OB History    Grav Para Term Preterm Abortions TAB SAB Ect Mult Living                  Review of Systems  Constitutional: Negative for fever and chills.  HENT: Negative for neck pain and neck stiffness.   Eyes: Negative for pain.  Respiratory: Negative for shortness of breath.   Cardiovascular: Negative for chest pain.  Gastrointestinal: Negative for nausea, vomiting, abdominal pain and bowel incontinence.  Genitourinary: Negative for dysuria and hematuria.  Musculoskeletal: Positive for back pain. Negative for myalgias, joint swelling and gait problem.  Skin: Negative for rash and wound.  Neurological: Negative for tingling, loss of consciousness, numbness and headaches.  All other systems reviewed and are negative.    Allergies  Codeine and Darvocet  Home Medications  No current outpatient prescriptions on file.  BP 122/75  Pulse 83  Temp(Src) 98.7 F (37.1 C) (Oral)  Resp 18  SpO2 99%  LMP 04/09/2011  Physical Exam  Constitutional: She is oriented to person, place, and time. She appears well-developed and well-nourished.  HENT:  Head: Normocephalic and atraumatic.  Eyes: Conjunctivae and EOM are normal. Pupils are equal, round, and reactive to light.  Neck: Full passive range of motion without pain. Neck supple. No thyromegaly  present.       No midline cervical tenderness or deformity  Cardiovascular: Normal rate, regular rhythm, S1 normal, S2 normal and intact distal pulses.   Pulmonary/Chest: Effort normal and breath sounds normal.  Abdominal: Soft. Bowel sounds are normal. There is no tenderness. There is no CVA tenderness.  Musculoskeletal: Normal range of motion.       Mild midline lower lumbar tenderness without any deformity or step off. There is no ecchymosis erythema or evidence of external trauma. Lower extremity strengths, sensorium to  light touch and DTRs equal and intact  Neurological: She is alert and oriented to person, place, and time. She has normal strength and normal reflexes. No cranial nerve deficit or sensory deficit. She displays a negative Romberg sign. GCS eye subscore is 4. GCS verbal subscore is 5. GCS motor subscore is 6.       Normal Gait  Skin: Skin is warm and dry. No rash noted. No cyanosis. Nails show no clubbing.  Psychiatric: She has a normal mood and affect. Her speech is normal and behavior is normal.    ED Course  Procedures (including critical care time)  Labs Reviewed - No data to display Dg Lumbar Spine Complete  04/30/2011  *RADIOLOGY REPORT*  Clinical Data: Status post fall against piece of furniture; lower back pain.  LUMBAR SPINE - COMPLETE 4+ VIEW  Comparison: CT of the abdomen and pelvis performed 12/31/2006  Findings: There is no evidence of fracture or subluxation. Vertebral bodies demonstrate normal height and alignment. Intervertebral disc spaces are preserved.  The visualized neural foramina are grossly unremarkable in appearance.  The visualized bowel gas pattern is unremarkable in appearance; air and stool are noted within the colon.  The sacroiliac joints are within normal limits.  IMPRESSION: No evidence of fracture or subluxation along the lumbar spine.  Original Report Authenticated By: Tonia Ghent, M.D.     Diagnoses: Lumbar contusion      MDM  Pain control and imaging reviewed as above. No lower extremity deficits stable for discharge home        Sunnie Nielsen, MD 05/01/11 [phone]

## 2011-05-01 NOTE — ED Notes (Signed)
Pt ambulates with steady gait guarding against pain; no signs of distress; A&Ox3; reported will follow d/c instructions.

## 2011-10-05 ENCOUNTER — Emergency Department (HOSPITAL_COMMUNITY): Payer: Self-pay

## 2011-10-05 ENCOUNTER — Encounter (HOSPITAL_COMMUNITY): Payer: Self-pay | Admitting: *Deleted

## 2011-10-05 ENCOUNTER — Emergency Department (HOSPITAL_COMMUNITY)
Admission: EM | Admit: 2011-10-05 | Discharge: 2011-10-06 | Disposition: A | Discharge status: Home / Self Care | Payer: Self-pay | Attending: Emergency Medicine | Admitting: Emergency Medicine

## 2011-10-05 DIAGNOSIS — R079 Chest pain, unspecified: Secondary | ICD-10-CM | POA: Insufficient documentation

## 2011-10-05 DIAGNOSIS — R6883 Chills (without fever): Secondary | ICD-10-CM | POA: Insufficient documentation

## 2011-10-05 DIAGNOSIS — R0602 Shortness of breath: Secondary | ICD-10-CM | POA: Insufficient documentation

## 2011-10-05 DIAGNOSIS — J36 Peritonsillar abscess: Secondary | ICD-10-CM | POA: Insufficient documentation

## 2011-10-05 DIAGNOSIS — K029 Dental caries, unspecified: Secondary | ICD-10-CM | POA: Insufficient documentation

## 2011-10-05 LAB — CBC
HCT: 43.2 % (ref 36.0–46.0)
MCHC: 35.6 g/dL (ref 30.0–36.0)
Platelets: 202 10*3/uL (ref 150–400)
RDW: 13.8 % (ref 11.5–15.5)

## 2011-10-05 LAB — BASIC METABOLIC PANEL
Calcium: 10 mg/dL (ref 8.4–10.5)
Chloride: 98 mEq/L (ref 96–112)
Creatinine, Ser: 0.66 mg/dL (ref 0.50–1.10)
GFR calc Af Amer: >90 mL/min (ref >90)

## 2011-10-05 LAB — DIFFERENTIAL
Basophils Absolute: 0.1 10*3/uL (ref 0.0–0.1)
Basophils Relative: 0 % (ref 0–1)
Monocytes Absolute: 1.2 10*3/uL — ABNORMAL HIGH (ref 0.1–1.0)
Neutro Abs: 9.1 10*3/uL — ABNORMAL HIGH (ref 1.7–7.7)

## 2011-10-05 LAB — TROPONIN I: Troponin I: <0.3 ng/mL (ref <0.30)

## 2011-10-05 LAB — RAPID STREP SCREEN (MED CTR MEBANE ONLY): Streptococcus, Group A Screen (Direct): NEGATIVE

## 2011-10-05 MED ORDER — DEXAMETHASONE 6 MG PO TABS
10.0000 mg | ORAL_TABLET | ORAL | Status: AC
  Administered 2011-10-05: 10 mg via ORAL
  Filled 2011-10-05: qty 1

## 2011-10-05 MED ORDER — CLINDAMYCIN PHOSPHATE 900 MG/50ML IV SOLN: 900.0000 mg | Freq: Once | INTRAVENOUS | Status: DC

## 2011-10-05 MED ORDER — OXYCODONE-ACETAMINOPHEN 5-325 MG PO TABS
1.0000 | ORAL_TABLET | Freq: Once | ORAL | Status: AC
  Administered 2011-10-05: 1 via ORAL
  Filled 2011-10-05: qty 1

## 2011-10-05 MED ORDER — SODIUM CHLORIDE 0.9 % IV SOLN
2.0000 g | INTRAVENOUS | Status: AC
  Administered 2011-10-06: 2 g via INTRAVENOUS
  Filled 2011-10-05: qty 2000

## 2011-10-05 MED ORDER — IOHEXOL 300 MG/ML  SOLN
75.0000 mL | Freq: Once | INTRAMUSCULAR | Status: AC | PRN
  Administered 2011-10-05: 75 mL via INTRAVENOUS

## 2011-10-05 MED ORDER — HYDROCODONE-ACETAMINOPHEN 5-500 MG PO TABS
1.0000 | ORAL_TABLET | Freq: Four times a day (QID) | ORAL | Status: AC | PRN
Start: 2011-10-05 — End: 2011-10-15

## 2011-10-05 MED ORDER — AMOXICILLIN 500 MG PO CAPS: 500.0000 mg | ORAL_CAPSULE | Freq: Three times a day (TID) | ORAL | Status: AC

## 2011-10-05 NOTE — ED Provider Notes (Signed)
History     CSN: [id number]  Arrival date & time 10/05/11  1729   First MD Initiated Contact with Patient 10/05/11 2006      Chief Complaint  Patient presents with  . Sore Throat    (Consider location/radiation/quality/duration/timing/severity/associated sxs/prior treatment) Patient is a 51 y.o. female presenting with pharyngitis. The history is provided by the patient.  Sore Throat This is a recurrent problem. The current episode started in the past 7 days. The problem occurs constantly. The problem has been gradually worsening. Associated symptoms include chills, nausea, a sore throat and swollen glands. Pertinent negatives include no abdominal pain, chest pain, coughing, fatigue, fever, headaches, rash or vomiting. The symptoms are aggravated by drinking and eating. She has tried NSAIDs for the symptoms. The treatment provided mild relief.    Past Medical History  Diagnosis Date  . Chest pain     multiple ED visit, no objective evidence of cardiac or pulmonary causes  of chest pain in past  . Arthritis     of cervical and lumbar spine  . Asthma     undiagnosed, was told by a physician in the ED, patient does not take any inhalers at home.     Past Surgical History  Procedure Date  . Peri tonsillar abscess 10/11    drainage  . Drainage of peri tonsillar abscess     Family History  Problem Relation Age of Onset  . Heart disease Mother 66  . Diabetes Mother   . Heart disease Maternal Aunt 40    History  Substance Use Topics  . Smoking status: Former Smoker    Quit date: 10/09/2003  . Smokeless tobacco: Not on file  . Alcohol Use: No    OB History    Grav Para Term Preterm Abortions TAB SAB Ect Mult Living                  Review of Systems  Constitutional: Positive for chills. Negative for fever and fatigue.  HENT: Positive for sore throat and voice change. Negative for drooling, trouble swallowing and neck stiffness.   Respiratory: Negative for cough,  chest tightness and shortness of breath.   Cardiovascular: Negative for chest pain.  Gastrointestinal: Positive for nausea. Negative for vomiting, abdominal pain and diarrhea.  Genitourinary: Negative for dysuria.  Skin: Negative for rash.  Neurological: Negative for headaches.  All other systems reviewed and are negative.    Allergies  Codeine and Darvocet  Home Medications  No current outpatient prescriptions on file.  BP 147/88  Pulse 86  Temp(Src) 98.8 F (37.1 C) (Oral)  Resp 22  SpO2 98%  LMP 10/01/2011  Physical Exam  Nursing note and vitals reviewed. Constitutional: She is oriented to person, place, and time. She appears well-developed and well-nourished.  HENT:  Head: Normocephalic and atraumatic. No trismus in the jaw.  Mouth/Throat: Uvula is midline. Dental caries present. No uvula swelling. Posterior oropharyngeal edema and posterior oropharyngeal erythema present. No oropharyngeal exudate.  Eyes: EOM are normal.  Neck: Normal range of motion.  Cardiovascular: Normal rate, regular rhythm and normal heart sounds.   Pulmonary/Chest: Effort normal and breath sounds normal. No respiratory distress.  Abdominal: Soft. There is no tenderness.  Musculoskeletal: Normal range of motion.  Neurological: She is alert and oriented to person, place, and time.  Skin: Skin is warm and dry.  Psychiatric: She has a normal mood and affect.    ED Course  Procedures (including critical care time)  Date: 10/05/2011  Rate: 77  Rhythm: normal sinus rhythm  QRS Axis: right  Intervals: normal  ST/T Wave abnormalities: normal  Conduction Disutrbances:none  Narrative Interpretation:   Old EKG Reviewed: changes noted Axis has changed.     Labs Reviewed  RAPID STREP SCREEN   Results for orders placed during the hospital encounter of 10/05/11  RAPID STREP SCREEN      Component Value Range   Streptococcus, Group A Screen (Direct) NEGATIVE  NEGATIVE   CBC      Component Value  Range   WBC 13.3 (*) 4.0 - 10.5 (K/uL)   RBC 5.05  3.87 - 5.11 (MIL/uL)   Hemoglobin 15.4 (*) 12.0 - 15.0 (g/dL)   HCT 52.8  41.3 - 24.4 (%)   MCV 85.5  78.0 - 100.0 (fL)   MCH 30.5  26.0 - 34.0 (pg)   MCHC 35.6  30.0 - 36.0 (g/dL)   RDW 01.0  27.2 - 53.6 (%)   Platelets 202  150 - 400 (K/uL)  DIFFERENTIAL      Component Value Range   Neutrophils Relative 69  43 - 77 (%)   Neutro Abs 9.1 (*) 1.7 - 7.7 (K/uL)   Lymphocytes Relative 21  12 - 46 (%)   Lymphs Abs 2.9  0.7 - 4.0 (K/uL)   Monocytes Relative 9  3 - 12 (%)   Monocytes Absolute 1.2 (*) 0.1 - 1.0 (K/uL)   Eosinophils Relative 1  0 - 5 (%)   Eosinophils Absolute 0.1  0.0 - 0.7 (K/uL)   Basophils Relative 0  0 - 1 (%)   Basophils Absolute 0.1  0.0 - 0.1 (K/uL)  BASIC METABOLIC PANEL      Component Value Range   Sodium 136  135 - 145 (mEq/L)   Potassium 3.8  3.5 - 5.1 (mEq/L)   Chloride 98  96 - 112 (mEq/L)   CO2 24  19 - 32 (mEq/L)   Glucose, Bld 97  70 - 99 (mg/dL)   BUN 12  6 - 23 (mg/dL)   Creatinine, Ser 6.44  0.50 - 1.10 (mg/dL)   Calcium 03.4  8.4 - 10.5 (mg/dL)   GFR calc non Af Amer >90  >90 (mL/min)   GFR calc Af Amer >90  >90 (mL/min)  TROPONIN I      Component Value Range   Troponin I <0.30  <0.30 (ng/mL)    Dg Chest 2 View  10/05/2011  *RADIOLOGY REPORT*  Clinical Data: Sore throat with chest pain shortness of breath.  CHEST - 2 VIEW  Comparison: 09/22/2010  Findings: The lungs are clear without focal infiltrate, edema, pneumothorax or pleural effusion. The cardiopericardial silhouette is within normal limits for size.  Convex rightward thoracic scoliosis is stable.  IMPRESSION: Stable.  No acute cardiopulmonary findings.  Original Report Authenticated By: ERIC A. MANSELL, M.D.   Ct Soft Tissue Neck W Contrast  10/05/2011  *RADIOLOGY REPORT*  Clinical Data: Sore throat, shortness of breath, cough and fever.  CT NECK WITH CONTRAST  Technique:  Multidetector CT imaging of the neck was performed with intravenous  contrast.  Contrast: 75mL OMNIPAQUE IOHEXOL 300 MG/ML  SOLN  Comparison: CT of the neck performed 03/05/2010  Findings: There is a focal peripheral enhancing collection of fluid noted at the right palatine tonsil, measuring approximately 2.2 x 1.8 x 1.1 cm, compatible with an evolving abscess.  Surrounding soft tissue inflammation is noted, with haziness throughout the right parapharyngeal fat planes.  There is associated mass effect at the  level of the oropharynx and hypopharynx, extending inferiorly, though the valleculae and piriform sinuses remain grossly intact.  No significant prevertebral abnormalities are identified.  The left palatine tonsil remains grossly unremarkable.  The parotid and submandibular glands remain intact.  Visualized scattered cervical nodes remain normal in size.  There is no evidence of vascular compromise.  The visualized portions of the brain are unremarkable.  The visualized portions of the orbits are within normal limits.  The visualized portions of paranasal sinuses and mastoid air cells are well-aerated.  The thyroid gland is unremarkable in appearance.  These mediastinum is grossly normal in appearance.  The visualized lung apices are clear.  No acute osseous abnormalities are seen.  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  IMPRESSION: 2.2 x 1.8 x 1.1 cm focal abscess noted at the right palatine tonsil, with surrounding soft tissue inflammation.  Associated mass effect at the level of the oropharynx and hypopharynx, though the valleculae and piriform sinuses remain intact.  These results were called by telephone on 10/05/2011  at  10:57 p.m. to  Dr. Forbes Cellar, who verbally acknowledged these results.  Original Report Authenticated By: Tonia Ghent, M.D.     1. Peritonsillar abscess   2. Chest pain       MDM   Patient is a 50 year old female who presents with 2 days of sore throat. She describes right-sided pain and pain with  swallowing. She is still able to swallow and has no difficulty doing so. She also describes a pain on her right lateral neck in the submandibular region. She's had some subjective fevers with chills but no documented fever. Does have some subjective change in voice but no drooling. On my evaluation the patient has no trismus. She does have some tenderness to the right submandibular region. There is no elevation of the time. There is no trismus there is no drooling. She does have some erythema to the right tonsillar region. There is no focal abscess appreciated and uvula is midline.  After my evaluation the patient later complained of chest pain. An EKG was performed which was negative. She has no cardiac risk factors and has had multiple ED visits for this prior. Most recently she had a nuclear stress test one year ago which was normal. Patient states she's had this pain constantly since yesterday.     Further labs only with leukocytosis. Troponin normal. Chest x-ray and EKG benign. Feel comfortable single troponin ruling out infarction patient has had relatively recent stress test.  Her neck CT showed a fair sized right peritonsillar abscess. Was discussed with Dr.Wolicki of the ENT on call. He recommended dose of antibiotics here in followup in the morning for drainage in clinic. He was aware of associated mass effect on oropharynx and hypopharynx. Felt no indication for acute drainage.  She felt much improved after Decadron. Her airway remained intact without any signs of respiratory compromise. Feel comfortable with this plan.  Donnamarie Poag, MD 10/05/11 2356

## 2011-10-05 NOTE — ED Notes (Signed)
Pt has been having a sore throat since Saturday.  Pt has been having increasing pain pain and states that she has some swelling in the right side of her throat.  Pt states that it is painful to swallow and it feels like when she had a peri tonsillar abscess in the past that had to be drained.

## 2011-10-05 NOTE — ED Notes (Signed)
Report given to Elizabeth, RN.

## 2011-10-05 NOTE — ED Notes (Signed)
PT back on monitor; resting with family at bedside. NO signs of distress.

## 2011-10-05 NOTE — ED Notes (Signed)
Patient transported to CT 

## 2011-10-05 NOTE — ED Notes (Signed)
PT reports hx of pleurisy and chest pain began when started to feel she could not breathe.

## 2011-10-06 MED ORDER — MORPHINE SULFATE 4 MG/ML IJ SOLN
4.0000 mg | Freq: Once | INTRAMUSCULAR | Status: AC
  Administered 2011-10-06: 4 mg via INTRAVENOUS
  Filled 2011-10-06: qty 1

## 2011-10-06 MED ORDER — ONDANSETRON HCL 4 MG/2ML IJ SOLN
4.0000 mg | Freq: Once | INTRAMUSCULAR | Status: AC
  Administered 2011-10-06: 4 mg via INTRAVENOUS
  Filled 2011-10-06: qty 2

## 2011-10-06 NOTE — ED Notes (Signed)
Pt up to b/r, steady gait, husband at [facility], abx continuing to infuse/finishing, preparing for d/c.

## 2011-10-06 NOTE — ED Provider Notes (Signed)
I saw and evaluated the patient, reviewed the resident's note and I agree with the findings and plan.  R submandibular LAD +Rt tonsillar swelling. Uvula midline. Min trismus. No muffled voice. CT neck with small peritonsillar abscess. Airway intact. Resident d/w ENT who will see in office < 8 hours and drain. Given Abx in ED. Strict precautions for return.  Forbes Cellar, MD 10/06/11 0110

## 2011-10-06 NOTE — ED Notes (Signed)
Pain and nausea med given, abx infusing, family at [facility], pt alert, NAD, calm, interactive, skin W&D, resps e/u, speaking in clear complete setnences, "feels better".

## 2012-02-02 ENCOUNTER — Other Ambulatory Visit: Payer: Self-pay

## 2012-02-02 ENCOUNTER — Emergency Department (HOSPITAL_COMMUNITY)
Admission: EM | Admit: 2012-02-02 | Discharge: 2012-02-03 | Disposition: A | Discharge status: Home / Self Care | Payer: Self-pay | Attending: Emergency Medicine | Admitting: Emergency Medicine

## 2012-02-02 ENCOUNTER — Encounter (HOSPITAL_COMMUNITY): Payer: Self-pay | Admitting: Emergency Medicine

## 2012-02-02 ENCOUNTER — Emergency Department (HOSPITAL_COMMUNITY): Payer: Self-pay

## 2012-02-02 DIAGNOSIS — R079 Chest pain, unspecified: Secondary | ICD-10-CM | POA: Insufficient documentation

## 2012-02-02 DIAGNOSIS — R05 Cough: Secondary | ICD-10-CM | POA: Insufficient documentation

## 2012-02-02 DIAGNOSIS — M549 Dorsalgia, unspecified: Secondary | ICD-10-CM | POA: Insufficient documentation

## 2012-02-02 DIAGNOSIS — R059 Cough, unspecified: Secondary | ICD-10-CM | POA: Insufficient documentation

## 2012-02-02 DIAGNOSIS — R071 Chest pain on breathing: Secondary | ICD-10-CM | POA: Insufficient documentation

## 2012-02-02 LAB — CBC WITH DIFFERENTIAL/PLATELET
Basophils Absolute: 0 10*3/uL (ref 0.0–0.1)
Basophils Relative: 0 % (ref 0–1)
Eosinophils Absolute: 0.2 10*3/uL (ref 0.0–0.7)
HCT: 42.7 % (ref 36.0–46.0)
Hemoglobin: 14.6 g/dL (ref 12.0–15.0)
MCH: 29.9 pg (ref 26.0–34.0)
MCHC: 34.2 g/dL (ref 30.0–36.0)
Monocytes Absolute: 0.9 10*3/uL (ref 0.1–1.0)
Monocytes Relative: 9 % (ref 3–12)
Neutro Abs: 6.2 10*3/uL (ref 1.7–7.7)
RDW: 13.6 % (ref 11.5–15.5)

## 2012-02-02 LAB — BASIC METABOLIC PANEL
BUN: 13 mg/dL (ref 6–23)
Calcium: 9.8 mg/dL (ref 8.4–10.5)
Chloride: 101 mEq/L (ref 96–112)
Creatinine, Ser: 0.64 mg/dL (ref 0.50–1.10)
GFR calc Af Amer: >90 mL/min (ref >90)
GFR calc non Af Amer: >90 mL/min (ref >90)

## 2012-02-02 LAB — TROPONIN I: Troponin I: <0.3 ng/mL (ref <0.30)

## 2012-02-02 MED ORDER — KETOROLAC TROMETHAMINE 60 MG/2ML IM SOLN
60.0000 mg | Freq: Once | INTRAMUSCULAR | Status: AC
  Administered 2012-02-02: 60 mg via INTRAMUSCULAR
  Filled 2012-02-02: qty 2

## 2012-02-02 MED ORDER — ASPIRIN 81 MG PO CHEW
162.0000 mg | CHEWABLE_TABLET | Freq: Once | ORAL | Status: AC
  Administered 2012-02-02: 162 mg via ORAL
  Filled 2012-02-02: qty 2

## 2012-02-02 NOTE — ED Notes (Signed)
Pt given warm blanket and again explained and apologized for wait

## 2012-02-02 NOTE — ED Notes (Signed)
Chest  Pain she states lungs hurt rt worse than left feels weak has had a fever

## 2012-02-02 NOTE — ED Notes (Signed)
Pt explained the delay and apologized for wait.

## 2012-02-02 NOTE — ED Notes (Addendum)
States since Saturday she has been very congested.  States she has been bring up clear pflegm.  States it hurts over her lungs in the back and in the center of her chest it hurts worse.

## 2012-02-03 MED ORDER — IBUPROFEN 600 MG PO TABS: 600.0000 mg | ORAL_TABLET | Freq: Four times a day (QID) | ORAL | Status: AC | PRN

## 2012-02-03 MED ORDER — AZITHROMYCIN 250 MG PO TABS: 250.0000 mg | ORAL_TABLET | Freq: Every day | ORAL | Status: AC

## 2012-02-03 MED ORDER — METHOCARBAMOL 500 MG PO TABS: 500.0000 mg | ORAL_TABLET | Freq: Two times a day (BID) | ORAL | Status: AC

## 2012-02-03 MED ORDER — HYDROCODONE-ACETAMINOPHEN 5-325 MG PO TABS: 1.0000 | ORAL_TABLET | Freq: Four times a day (QID) | ORAL | Status: AC | PRN

## 2012-02-03 MED ORDER — METHOCARBAMOL 500 MG PO TABS
500.0000 mg | ORAL_TABLET | Freq: Once | ORAL | Status: AC
  Administered 2012-02-03: 500 mg via ORAL
  Filled 2012-02-03: qty 1

## 2012-02-03 MED ORDER — HYDROCODONE-ACETAMINOPHEN 5-325 MG PO TABS
1.0000 | ORAL_TABLET | Freq: Once | ORAL | Status: AC
Start: 2012-02-03 — End: 2012-02-03
  Administered 2012-02-03: 1 via ORAL
  Filled 2012-02-03: qty 1

## 2012-02-03 NOTE — ED Provider Notes (Signed)
History     CSN: [id number]  Arrival date & time 02/02/12  1435   First MD Initiated Contact with Patient 02/02/12 2030      Chief Complaint  Patient presents with  . Chest Pain    (Consider location/radiation/quality/duration/timing/severity/associated sxs/prior treatment) HPI Comments: Pt with no significant medical hx comes in with cc of back pain and some chest pain. Both of these pains are unrelated. The chest pain is midsternal, non radiating, and is intermittent with no specific aggravating or relieving factors. Pt has no associated n/v/f/c/diophoresis/SOB. Pt also has some back pain - left lower thorax. This pain has been present for few days now and is worse with breathing and with movement and laying on the back. There is + cough - non productive. Subjective fevers, no chills.   Patient is a 51 y.o. female presenting with chest pain. The history is provided by the patient.  Chest Pain Primary symptoms include cough. Pertinent negatives for primary symptoms include no shortness of breath, no wheezing, no palpitations, no abdominal pain, no nausea and no vomiting.     Past Medical History  Diagnosis Date  . Chest pain     multiple ED visit, no objective evidence of cardiac or pulmonary causes  of chest pain in past  . Arthritis     of cervical and lumbar spine  . Asthma     undiagnosed, was told by a physician in the ED, patient does not take any inhalers at home.     Past Surgical History  Procedure Date  . Peri tonsillar abscess 10/11    drainage  . Drainage of peri tonsillar abscess     Family History  Problem Relation Age of Onset  . Heart disease Mother 57  . Diabetes Mother   . Heart disease Maternal Aunt 40    History  Substance Use Topics  . Smoking status: Former Smoker    Quit date: 10/09/2003  . Smokeless tobacco: Not on file  . Alcohol Use: No    OB History    Grav Para Term Preterm Abortions TAB SAB Ect Mult Living                   Review of Systems  Constitutional: Negative for activity change.  HENT: Negative for facial swelling and neck pain.   Respiratory: Positive for cough. Negative for shortness of breath and wheezing.   Cardiovascular: Positive for chest pain. Negative for palpitations and leg swelling.  Gastrointestinal: Negative for nausea, vomiting, abdominal pain, diarrhea, constipation, blood in stool and abdominal distention.  Genitourinary: Negative for hematuria and difficulty urinating.  Skin: Negative for color change.  Neurological: Negative for speech difficulty.  Hematological: Does not bruise/bleed easily.  Psychiatric/Behavioral: Negative for confusion.    Allergies  Codeine and Darvocet  Home Medications   Current Outpatient Rx  Name Route Sig Dispense Refill  . ASPIRIN 325 MG PO TABS Oral Take 650 mg by mouth daily.    . AZITHROMYCIN 250 MG PO TABS Oral Take 1 tablet (250 mg total) by mouth daily. Take first 2 tablets together, then 1 every day until finished. 6 tablet 0  . HYDROCODONE-ACETAMINOPHEN 5-325 MG PO TABS Oral Take 1 tablet by mouth every 6 (six) hours as needed for pain (Breakthrough pain only). 15 tablet 0  . IBUPROFEN 600 MG PO TABS Oral Take 1 tablet (600 mg total) by mouth every 6 (six) hours as needed for pain. 30 tablet 0  . METHOCARBAMOL 500 MG  PO TABS Oral Take 1 tablet (500 mg total) by mouth 2 (two) times daily. 20 tablet 0    BP 129/76  Pulse 77  Temp 98.3 F (36.8 C) (Oral)  Resp 16  SpO2 100%  LMP 02/02/2012  Physical Exam  Nursing note and vitals reviewed. Constitutional: She is oriented to person, place, and time. She appears well-developed and well-nourished.  HENT:  Head: Normocephalic and atraumatic.  Eyes: EOM are normal. Pupils are equal, round, and reactive to light.  Neck: Neck supple.  Cardiovascular: Normal rate, regular rhythm and normal heart sounds.   No murmur heard. Pulmonary/Chest: Effort normal. No respiratory distress.   Abdominal: Soft. She exhibits no distension. There is no tenderness. There is no rebound and no guarding.  Musculoskeletal:       Back pain reproducible with palpation, there is no CVA tenderness  Neurological: She is alert and oriented to person, place, and time.  Skin: Skin is warm and dry.    ED Course  Procedures (including critical care time)   Labs Reviewed  CBC WITH DIFFERENTIAL  BASIC METABOLIC PANEL  TROPONIN I  TROPONIN I   Dg Chest 2 View  02/02/2012  *RADIOLOGY REPORT*  Clinical Data: Chest pain  CHEST - 2 VIEW  Comparison: 10/05/2011  Findings: Normal heart size and vascularity.  Stable scoliosis of the thoracic spine.  Negative for pneumonia, collapse, consolidation, edema, effusion or pneumothorax.  Trachea midline.  IMPRESSION: Stable exam.  No acute chest finding.  Scoliosis.   Original Report Authenticated By: Judie Petit. Ruel Favors, M.D.      1. Chest pain on breathing       MDM  . Date: 02/03/2012  Rate: 92  Rhythm: normal sinus rhythm  QRS Axis: normal  Intervals: normal  ST/T Wave abnormalities: normal  Conduction Disutrbances: none  Narrative Interpretation: unremarkable      Pt comes in with cc of back pain and chest pain. Pt has no cardiac risk factors, has had previous negative MPI. We will get 2 trops for ACS r/o. EKG is WNL. Pt has no WELLs score of 0, and is PERC negative - so no concerns for PE.  Pt has this back pain that is reproducible with palpation and is worse with movment, No dissection risk factors. CXR to r/o pneumonia - also  No abnormal lung exam, no fever in the ER. She wants AB - wait and watch approach discussed, and will give AB.        Derwood Kaplan, MD 02/03/12 0100

## 2012-12-01 IMAGING — CT CT NECK W/ CM
4 of 5 series · 15 of 33 positions shown, 17 images · IV contrast (omnipaque)
Comparison: CT of the neck performed 03/05/2010

CLINICAL DATA: Sore throat, shortness of breath, cough and fever.

CT NECK WITH CONTRAST
TECHNIQUE: Multidetector CT imaging of the neck was performed with
intravenous contrast.
Contrast: 75mL OMNIPAQUE IOHEXOL 300 MG/ML  SOLN

[Series 3: coronals · coronal · 0.48mm/px · 3 of 88 slices shown]
[im 18/88  bone]
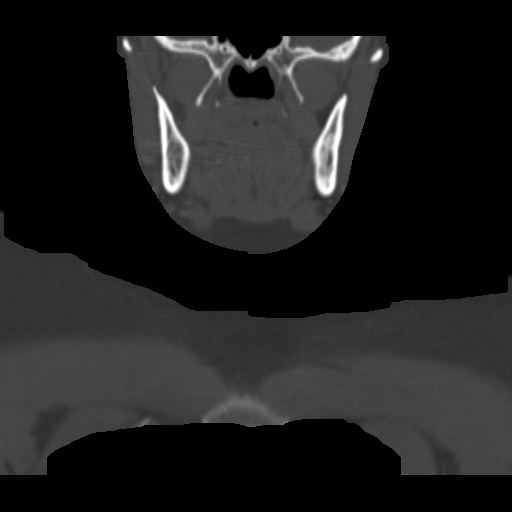
[im 35/88  bone]
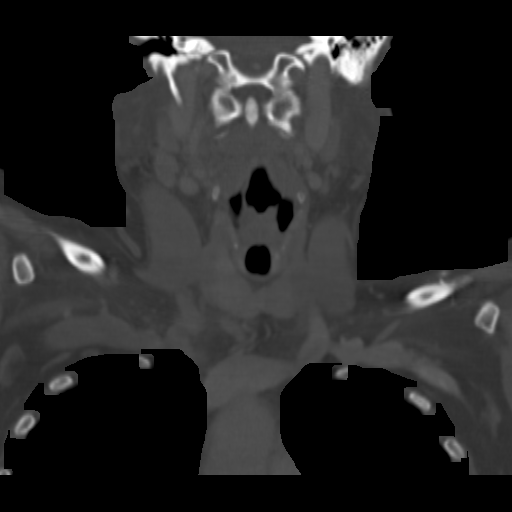
[im 53/88  bone]
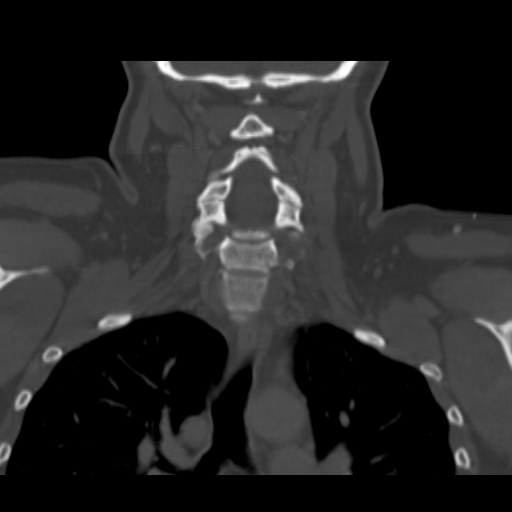

[Series 4: orthogonals · axial · 0.41mm/px · z∈[+842,+921]mm · 3 of 91 slices shown]
[im 23/91  bone]
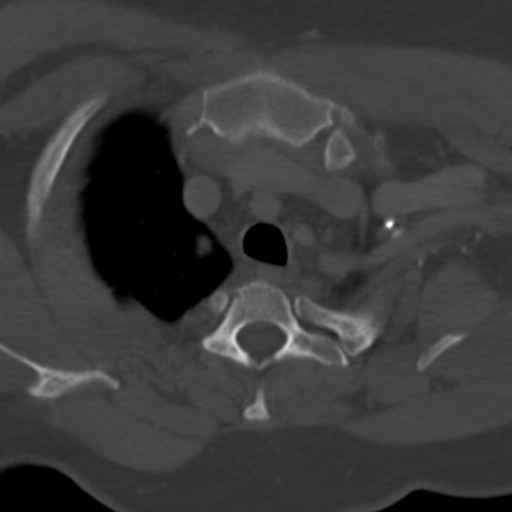
[im 46/91  bone]
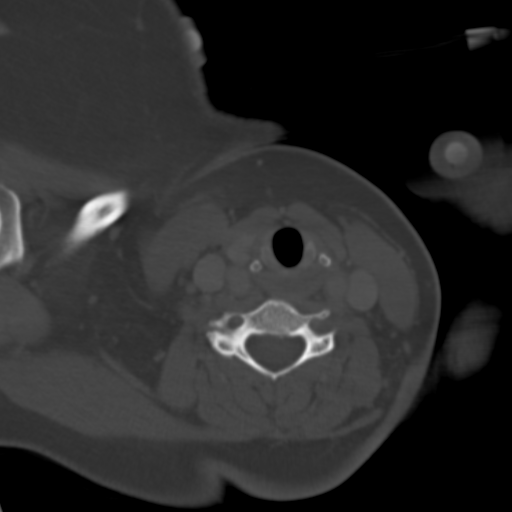
[im 68/91  bone]
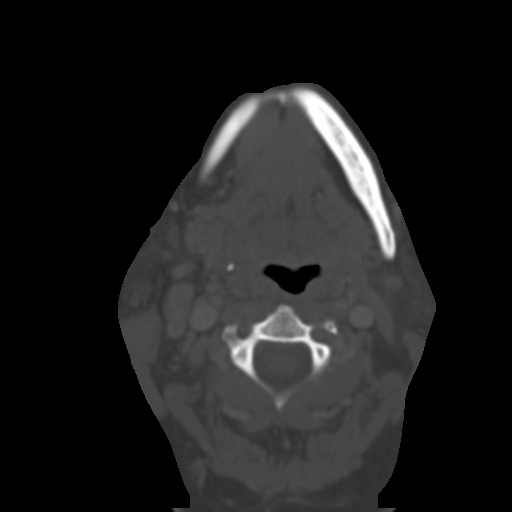

[Series 5: sagittals · sagittal · 0.48mm/px · 5 of 100 slices shown, 6 images]
[im 34/100  bone]
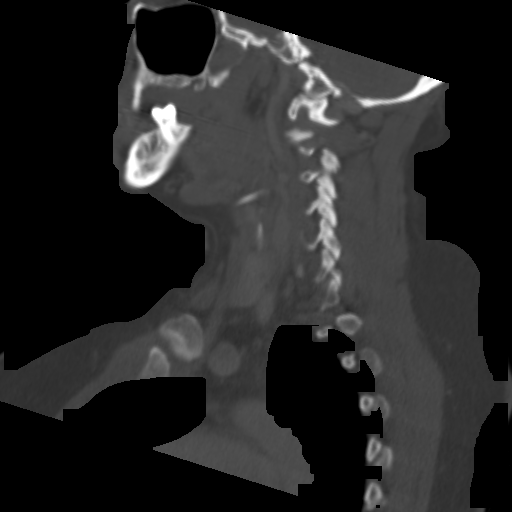
[im 42/100  bone]
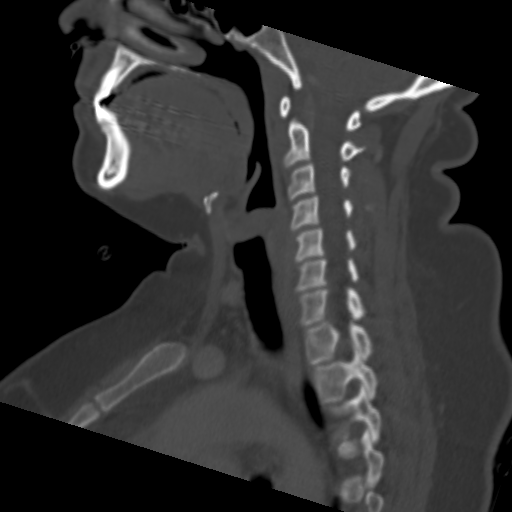
[im 50/100  soft-tissue]
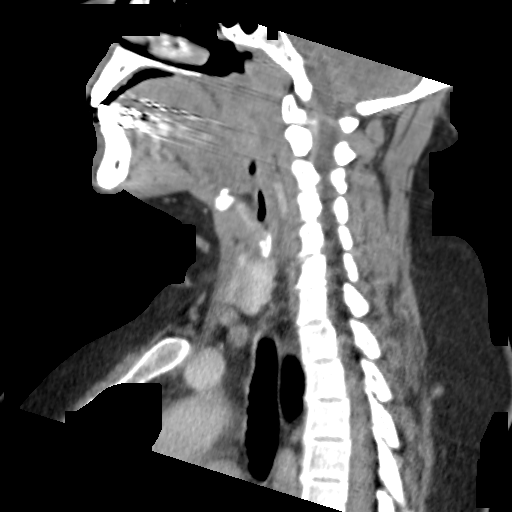
[im 50/100  bone]
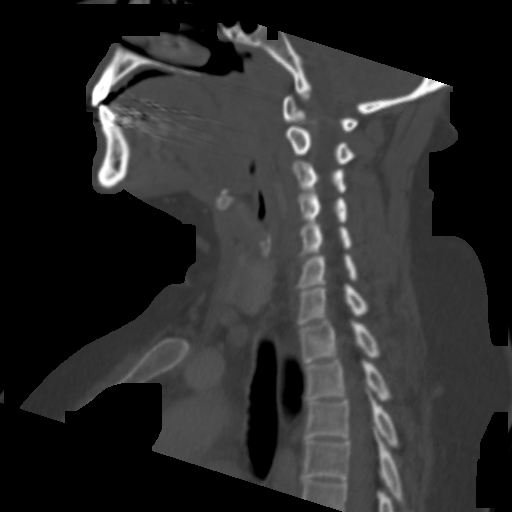
[im 58/100  bone]
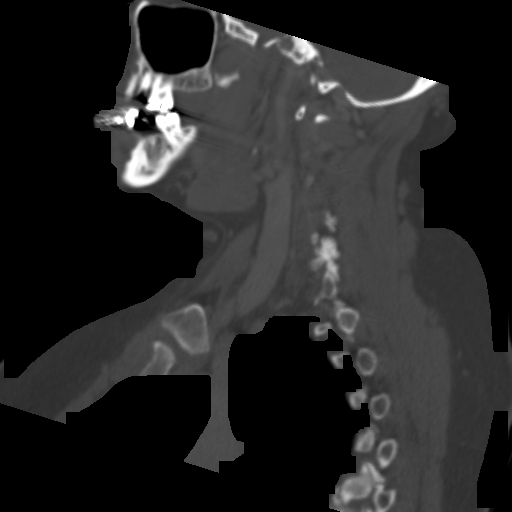
[im 67/100  bone]
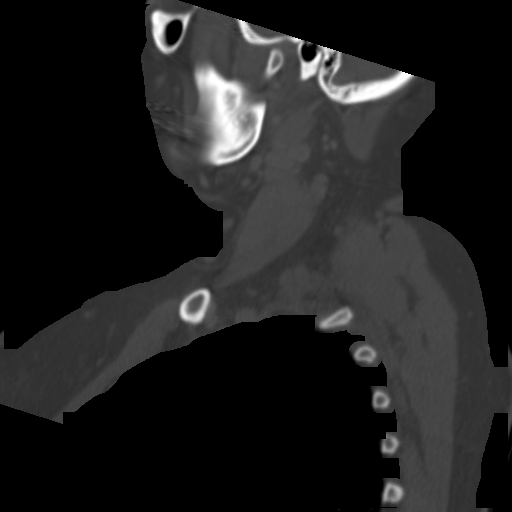

[Series 6: st neck 2.0 b31s · axial · 0.45mm/px · z∈[+872,+998]mm · 4 of 107 slices shown, 5 images]
[im 22/107  soft-tissue]
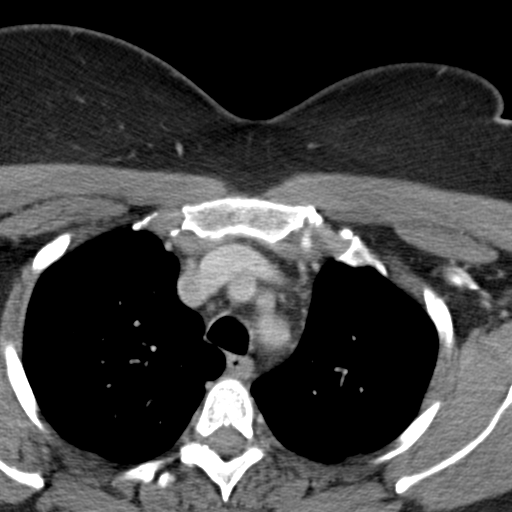
[im 22/107  bone]
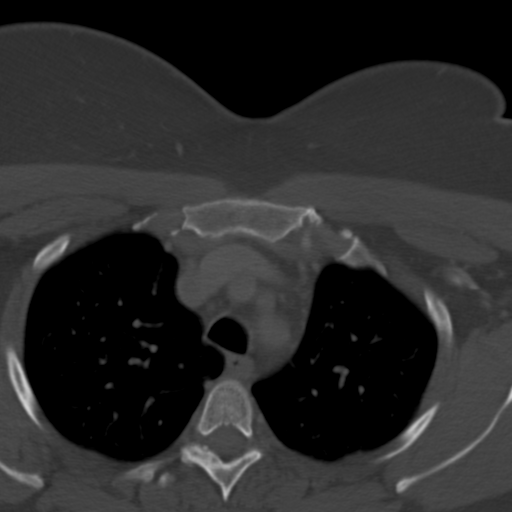
[im 43/107  bone]
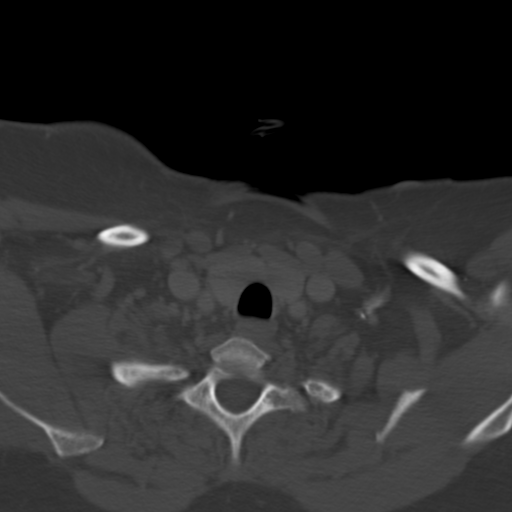
[im 64/107  bone]
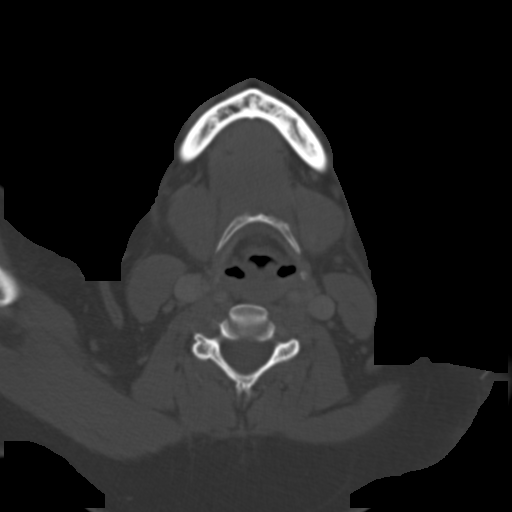
[im 85/107  bone]
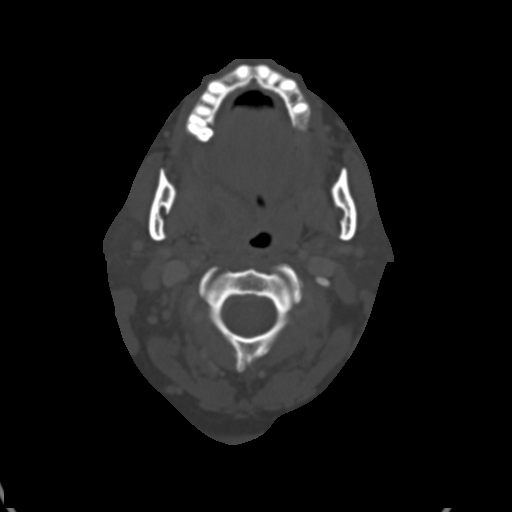

[15 of 33 positions shown; findings below may reference images not displayed]

FINDINGS: There is a focal peripheral enhancing collection of fluid
noted at the right palatine tonsil, measuring approximately 2.2 x
1.8 x 1.1 cm, compatible with an evolving abscess.  Surrounding
soft tissue inflammation is noted, with haziness throughout the
right parapharyngeal fat planes.  There is associated mass effect
at the level of the oropharynx and hypopharynx, extending
inferiorly, though the valleculae and piriform sinuses remain
grossly intact.

No significant prevertebral abnormalities are identified.  The left
palatine tonsil remains grossly unremarkable.

The parotid and submandibular glands remain intact.  Visualized
scattered cervical nodes remain normal in size.  There is no
evidence of vascular compromise.

The visualized portions of the brain are unremarkable.  The
visualized portions of the orbits are within normal limits.  The
visualized portions of paranasal sinuses and mastoid air cells are
well-aerated.

The thyroid gland is unremarkable in appearance.  These mediastinum
is grossly normal in appearance.  The visualized lung apices are
clear.

No acute osseous abnormalities are seen.  There is chronic absence
of multiple maxillary and mandibular teeth.
IMPRESSION: 2.2 x 1.8 x 1.1 cm focal abscess noted at the right palatine
tonsil, with surrounding soft tissue inflammation.  Associated mass
effect at the level of the oropharynx and hypopharynx, though the
valleculae and piriform sinuses remain intact.

These results were called by telephone on 10/05/2011  at  [DATE]
p.m. to  Dr. Ervista Ceola, who verbally acknowledged these
results.

## 2013-02-18 ENCOUNTER — Encounter (HOSPITAL_COMMUNITY): Payer: Self-pay | Admitting: Emergency Medicine

## 2013-02-18 ENCOUNTER — Emergency Department (HOSPITAL_COMMUNITY): Payer: Self-pay

## 2013-02-18 ENCOUNTER — Emergency Department (HOSPITAL_COMMUNITY)
Admission: EM | Admit: 2013-02-18 | Discharge: 2013-02-18 | Disposition: A | Discharge status: Home / Self Care | Payer: Self-pay | Attending: Emergency Medicine | Admitting: Emergency Medicine

## 2013-02-18 DIAGNOSIS — R0789 Other chest pain: Secondary | ICD-10-CM | POA: Insufficient documentation

## 2013-02-18 DIAGNOSIS — J45909 Unspecified asthma, uncomplicated: Secondary | ICD-10-CM | POA: Insufficient documentation

## 2013-02-18 DIAGNOSIS — M47812 Spondylosis without myelopathy or radiculopathy, cervical region: Secondary | ICD-10-CM | POA: Insufficient documentation

## 2013-02-18 DIAGNOSIS — M7989 Other specified soft tissue disorders: Secondary | ICD-10-CM

## 2013-02-18 DIAGNOSIS — IMO0001 Reserved for inherently not codable concepts without codable children: Secondary | ICD-10-CM | POA: Insufficient documentation

## 2013-02-18 DIAGNOSIS — M79609 Pain in unspecified limb: Secondary | ICD-10-CM | POA: Insufficient documentation

## 2013-02-18 DIAGNOSIS — Z87891 Personal history of nicotine dependence: Secondary | ICD-10-CM | POA: Insufficient documentation

## 2013-02-18 DIAGNOSIS — R079 Chest pain, unspecified: Secondary | ICD-10-CM

## 2013-02-18 DIAGNOSIS — R110 Nausea: Secondary | ICD-10-CM | POA: Insufficient documentation

## 2013-02-18 DIAGNOSIS — R600 Localized edema: Secondary | ICD-10-CM

## 2013-02-18 DIAGNOSIS — Z7982 Long term (current) use of aspirin: Secondary | ICD-10-CM | POA: Insufficient documentation

## 2013-02-18 DIAGNOSIS — R609 Edema, unspecified: Secondary | ICD-10-CM | POA: Insufficient documentation

## 2013-02-18 DIAGNOSIS — M47817 Spondylosis without myelopathy or radiculopathy, lumbosacral region: Secondary | ICD-10-CM | POA: Insufficient documentation

## 2013-02-18 DIAGNOSIS — M2559 Pain in other specified joint: Secondary | ICD-10-CM | POA: Insufficient documentation

## 2013-02-18 LAB — BASIC METABOLIC PANEL
Calcium: 8.9 mg/dL (ref 8.4–10.5)
GFR calc Af Amer: >90 mL/min (ref >90)
GFR calc non Af Amer: >90 mL/min (ref >90)
Glucose, Bld: 98 mg/dL (ref 70–99)
Potassium: 3.5 mEq/L (ref 3.5–5.1)
Sodium: 135 mEq/L (ref 135–145)

## 2013-02-18 LAB — CBC WITH DIFFERENTIAL/PLATELET
Basophils Absolute: 0 10*3/uL (ref 0.0–0.1)
Basophils Relative: 0 % (ref 0–1)
Eosinophils Absolute: 0.2 10*3/uL (ref 0.0–0.7)
Eosinophils Relative: 2 % (ref 0–5)
Lymphs Abs: 2.3 10*3/uL (ref 0.7–4.0)
MCH: 30 pg (ref 26.0–34.0)
MCHC: 34.1 g/dL (ref 30.0–36.0)
MCV: 88.1 fL (ref 78.0–100.0)
Neutrophils Relative %: 67 % (ref 43–77)
Platelets: 207 10*3/uL (ref 150–400)
RDW: 13.7 % (ref 11.5–15.5)

## 2013-02-18 MED ORDER — SULFAMETHOXAZOLE-TRIMETHOPRIM 800-160 MG PO TABS: 1.0000 | ORAL_TABLET | Freq: Two times a day (BID) | ORAL | Status: DC

## 2013-02-18 MED ORDER — OXYCODONE-ACETAMINOPHEN 5-325 MG PO TABS: 2.0000 | ORAL_TABLET | ORAL | Status: DC | PRN

## 2013-02-18 MED ORDER — MORPHINE SULFATE 4 MG/ML IJ SOLN
4.0000 mg | Freq: Once | INTRAMUSCULAR | Status: AC
  Administered 2013-02-18: 4 mg via INTRAVENOUS
  Filled 2013-02-18: qty 1

## 2013-02-18 NOTE — ED Notes (Signed)
Pt from home c/o L leg pain x2 months. Pt has swelling, redness, heat and an uneven appearance to skin. Pt is starting to have difficulty walking d/t swelling,pain. Pt adds that she is also having L arm pain and chest pain. Pt states that she was dx with "pleuricy, chest wall pain years ago" , but cannot give a time. Pt denies N/V/D. Pt is A&O and in NAD

## 2013-02-18 NOTE — Progress Notes (Signed)
VASCULAR LAB PRELIMINARY  PRELIMINARY  PRELIMINARY  PRELIMINARY  Left lower extremity venous Doppler completed.    Preliminary report:  There is no DVT or SVT noted in the left lower extremity.  Mizraim Harmening, RVT 02/18/2013, 5:02 PM

## 2013-02-18 NOTE — ED Notes (Signed)
Bed: WA20 Expected date: 02/18/13 Expected time: 3:08 PM Means of arrival: Ambulance Comments: Leg lac
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
## 2013-02-19 NOTE — ED Provider Notes (Signed)
Medical screening examination/treatment/procedure(s) were conducted as a shared visit with non-physician practitioner(s) and myself.  I personally evaluated the patient during the encounter  Patient with multiple complaints. She does have pain, swelling and redness of the left leg on examination. Ultrasound, however, is negative for DVT. There is several areas of the redness of her consistent with small pustules, concerning for folliculitis. This did be treated as an outpatient, followup with primary doctor. Return if symptoms worsen.  Gilda Crease, MD 02/19/13 [phone]

## 2014-07-26 ENCOUNTER — Emergency Department (HOSPITAL_COMMUNITY): Payer: Self-pay

## 2014-07-26 ENCOUNTER — Emergency Department (HOSPITAL_COMMUNITY)
Admission: EM | Admit: 2014-07-26 | Discharge: 2014-07-26 | Disposition: A | Discharge status: Home / Self Care | Payer: Self-pay | Attending: Emergency Medicine | Admitting: Emergency Medicine

## 2014-07-26 ENCOUNTER — Encounter (HOSPITAL_COMMUNITY): Payer: Self-pay | Admitting: *Deleted

## 2014-07-26 DIAGNOSIS — Z87891 Personal history of nicotine dependence: Secondary | ICD-10-CM | POA: Insufficient documentation

## 2014-07-26 DIAGNOSIS — J45909 Unspecified asthma, uncomplicated: Secondary | ICD-10-CM | POA: Insufficient documentation

## 2014-07-26 DIAGNOSIS — N920 Excessive and frequent menstruation with regular cycle: Secondary | ICD-10-CM | POA: Insufficient documentation

## 2014-07-26 DIAGNOSIS — M1990 Unspecified osteoarthritis, unspecified site: Secondary | ICD-10-CM | POA: Insufficient documentation

## 2014-07-26 DIAGNOSIS — N83201 Unspecified ovarian cyst, right side: Secondary | ICD-10-CM

## 2014-07-26 DIAGNOSIS — N8320 Unspecified ovarian cysts: Secondary | ICD-10-CM | POA: Insufficient documentation

## 2014-07-26 DIAGNOSIS — Z3202 Encounter for pregnancy test, result negative: Secondary | ICD-10-CM | POA: Insufficient documentation

## 2014-07-26 DIAGNOSIS — Z7982 Long term (current) use of aspirin: Secondary | ICD-10-CM | POA: Insufficient documentation

## 2014-07-26 LAB — URINALYSIS, ROUTINE W REFLEX MICROSCOPIC
Bilirubin Urine: NEGATIVE
Glucose, UA: NEGATIVE mg/dL
Hgb urine dipstick: NEGATIVE
Ketones, ur: NEGATIVE mg/dL
LEUKOCYTES UA: NEGATIVE
NITRITE: NEGATIVE
PH: 6 (ref 5.0–8.0)
Protein, ur: NEGATIVE mg/dL
SPECIFIC GRAVITY, URINE: 1.025 (ref 1.005–1.030)
UROBILINOGEN UA: 0.2 mg/dL (ref 0.0–1.0)

## 2014-07-26 LAB — CBC WITH DIFFERENTIAL/PLATELET
BASOS ABS: 0 10*3/uL (ref 0.0–0.1)
Basophils Relative: 0 % (ref 0–1)
Eosinophils Absolute: 0.1 10*3/uL (ref 0.0–0.7)
Eosinophils Relative: 2 % (ref 0–5)
HCT: 40.6 % (ref 36.0–46.0)
Hemoglobin: 13.6 g/dL (ref 12.0–15.0)
LYMPHS ABS: 1.9 10*3/uL (ref 0.7–4.0)
LYMPHS PCT: 24 % (ref 12–46)
MCH: 29.4 pg (ref 26.0–34.0)
MCHC: 33.5 g/dL (ref 30.0–36.0)
MCV: 87.7 fL (ref 78.0–100.0)
Monocytes Absolute: 0.4 10*3/uL (ref 0.1–1.0)
Monocytes Relative: 5 % (ref 3–12)
Neutro Abs: 5.3 10*3/uL (ref 1.7–7.7)
Neutrophils Relative %: 69 % (ref 43–77)
PLATELETS: 211 10*3/uL (ref 150–400)
RBC: 4.63 MIL/uL (ref 3.87–5.11)
RDW: 13.1 % (ref 11.5–15.5)
WBC: 7.7 10*3/uL (ref 4.0–10.5)

## 2014-07-26 LAB — WET PREP, GENITAL
CLUE CELLS WET PREP: NONE SEEN
TRICH WET PREP: NONE SEEN
YEAST WET PREP: NONE SEEN

## 2014-07-26 LAB — BASIC METABOLIC PANEL
Anion gap: 6 (ref 5–15)
BUN: 9 mg/dL (ref 6–23)
CO2: 25 mmol/L (ref 19–32)
CREATININE: 0.66 mg/dL (ref 0.50–1.10)
Calcium: 8.7 mg/dL (ref 8.4–10.5)
Chloride: 106 mmol/L (ref 96–112)
GLUCOSE: 111 mg/dL — AB (ref 70–99)
Potassium: 4.1 mmol/L (ref 3.5–5.1)
Sodium: 137 mmol/L (ref 135–145)

## 2014-07-26 LAB — PREGNANCY, URINE: Preg Test, Ur: NEGATIVE

## 2014-07-26 MED ORDER — ACETAMINOPHEN 500 MG PO TABS
1000.0000 mg | ORAL_TABLET | Freq: Once | ORAL | Status: DC
  Filled 2014-07-26: qty 2

## 2014-07-26 MED ORDER — SODIUM CHLORIDE 0.9 % IV BOLUS (SEPSIS)
1000.0000 mL | Freq: Once | INTRAVENOUS | Status: AC
  Administered 2014-07-26: 1000 mL via INTRAVENOUS

## 2014-07-26 MED ORDER — PROMETHAZINE HCL 25 MG PO TABS: 25.0000 mg | ORAL_TABLET | Freq: Four times a day (QID) | ORAL | Status: DC | PRN

## 2014-07-26 MED ORDER — OXYCODONE-ACETAMINOPHEN 5-325 MG PO TABS: 1.0000 | ORAL_TABLET | ORAL | Status: DC | PRN

## 2014-07-26 MED ORDER — MORPHINE SULFATE 4 MG/ML IJ SOLN
4.0000 mg | Freq: Once | INTRAMUSCULAR | Status: AC
  Administered 2014-07-26: 4 mg via INTRAMUSCULAR
  Filled 2014-07-26: qty 1

## 2014-07-26 NOTE — ED Notes (Signed)
Patient transported to Ultrasound 

## 2014-07-26 NOTE — ED Notes (Signed)
Pt reports she was sick 1 month ago with what she thought was the flu and then her fever broke, and she felt better. Pt states she began having SOB 1 week ago and then vaginal bleeding on Sunday with thick gelatinous clotting. Pt reports her lungs and abdomen are hurting and she is having generalized fatigue and dizziness along with chest pain under left breast that is intermittent.

## 2014-07-26 NOTE — Discharge Instructions (Signed)
Ultrasound shows a 6 cm cyst on your right ovary.  You will need gyn follow up.  Call the gyn clinic.  Number given.  Meds for pain and nausea

## 2014-07-26 NOTE — ED Notes (Signed)
MD made aware pt unable to take Tylenol. MD recommendation Percent

## 2014-07-27 LAB — GC/CHLAMYDIA PROBE AMP (~~LOC~~) NOT AT ARMC
CHLAMYDIA, DNA PROBE: NEGATIVE
Neisseria Gonorrhea: NEGATIVE

## 2014-07-28 NOTE — ED Provider Notes (Signed)
CSN: [id number]     Arrival date & time 07/26/14  1021 History   First MD Initiated Contact with Patient 07/26/14 1049     Chief Complaint  Patient presents with  . Vaginal Bleeding  . Abdominal Pain  . Nausea  . Shortness of Breath     (Consider location/radiation/quality/duration/timing/severity/associated sxs/prior Treatment) HPI.... Vaginal bleeding described as thick and gelatinous and Sunday. Minimal lower abdominal discomfort. No fever, chills, dysuria, chest pain, dyspnea. No vaginal discharge. Patient has a known history of ovarian cyst. Severity is mild to moderate.  Past Medical History  Diagnosis Date  . Chest pain     multiple ED visit, no objective evidence of cardiac or pulmonary causes  of chest pain in past  . Arthritis     of cervical and lumbar spine  . Asthma     undiagnosed, was told by a physician in the ED, patient does not take any inhalers at home.    Past Surgical History  Procedure Laterality Date  . Peri tonsillar abscess  10/11    drainage  . Drainage of peri tonsillar abscess     Family History  Problem Relation Age of Onset  . Heart disease Mother 5  . Diabetes Mother   . Heart disease Maternal Aunt 40   History  Substance Use Topics  . Smoking status: Former Smoker    Quit date: 10/09/2003  . Smokeless tobacco: Never Used  . Alcohol Use: No   OB History    No data available     Review of Systems  All other systems reviewed and are negative.     Allergies  Codeine and Darvocet  Home Medications   Prior to Admission medications   Medication Sig Start Date End Date Taking? Authorizing Provider  aspirin EC 81 MG tablet Take 81 mg by mouth daily.   Yes Historical Provider, MD  HYDROcodone-acetaminophen (NORCO/VICODIN) 5-325 MG per tablet Take 1 tablet by mouth every 6 (six) hours as needed for moderate pain.   Yes Historical Provider, MD  aspirin 325 MG tablet Take 650 mg by mouth daily.    Historical Provider, MD   oxyCODONE-acetaminophen (PERCOCET) 5-325 MG per tablet Take 1-2 tablets by mouth every 4 (four) hours as needed. 07/26/14   Donnetta Hutching, MD  promethazine (PHENERGAN) 25 MG tablet Take 1 tablet (25 mg total) by mouth every 6 (six) hours as needed for nausea. 07/26/14   Donnetta Hutching, MD  sulfamethoxazole-trimethoprim (SEPTRA DS) 800-160 MG per tablet Take 1 tablet by mouth every 12 (twelve) hours. Patient not taking: Reported on 07/26/2014 02/18/13   Jillyn Ledger, PA-C   BP 121/68 mmHg  Pulse 64  Temp(Src) 98.5 F (36.9 C) (Oral)  Resp 18  SpO2 98%  LMP 07/26/2014 Physical Exam  Constitutional: She is oriented to person, place, and time. She appears well-developed and well-nourished.  HENT:  Head: Normocephalic and atraumatic.  Eyes: Conjunctivae and EOM are normal. Pupils are equal, round, and reactive to light.  Neck: Normal range of motion. Neck supple.  Cardiovascular: Normal rate and regular rhythm.   Pulmonary/Chest: Effort normal and breath sounds normal.  Abdominal: Soft. Bowel sounds are normal.  Genitourinary:  Normal external genitalia. No cervical motion tenderness. No cervical discharge. Minimal amount of blood in vault. Adnexa normal.  Musculoskeletal: Normal range of motion.  Neurological: She is alert and oriented to person, place, and time.  Skin: Skin is warm and dry.  Psychiatric: She has a normal mood and affect. Her  behavior is normal.  Nursing note and vitals reviewed.   ED Course  Procedures (including critical care time) Labs Review Labs Reviewed  WET PREP, GENITAL - Abnormal; Notable for the following:    WBC, Wet Prep HPF POC FEW (*)    All other components within normal limits  URINALYSIS, ROUTINE W REFLEX MICROSCOPIC - Abnormal; Notable for the following:    APPearance HAZY (*)    All other components within normal limits  BASIC METABOLIC PANEL - Abnormal; Notable for the following:    Glucose, Bld 111 (*)    All other components within normal limits   PREGNANCY, URINE  CBC WITH DIFFERENTIAL/PLATELET  GC/CHLAMYDIA PROBE AMP ([facility])    Imaging Review No results found..... Ultrasound shows a 8.6 cm cystic lesion in the right ovary. This is a long-standing finding. 1.7 cm anterior uterine fibroid   EKG Interpretation None      MDM   Final diagnoses:  Menorrhagia  Right ovarian cyst    Patient is in no acute distress. Labs are essentially normal. Ultrasound reveals an 8.6 cm cystic right ovarian lesion. This is not a new finding. There is also a 1.7 cm anterior fibroid. She will get GYN follow-up. Discharge medications Percocet and Phenergan 25 mg. Discussed with the patient. No acute abdomen at discharge    Donnetta HutchingBrian Meryn Sarracino, MD 08/02/14 1545

## 2015-02-20 ENCOUNTER — Emergency Department (HOSPITAL_COMMUNITY)
Admission: EM | Admit: 2015-02-20 | Discharge: 2015-02-20 | Disposition: A | Discharge status: Home / Self Care | Payer: Self-pay | Attending: Emergency Medicine | Admitting: Emergency Medicine

## 2015-02-20 ENCOUNTER — Encounter (HOSPITAL_COMMUNITY): Payer: Self-pay | Admitting: *Deleted

## 2015-02-20 DIAGNOSIS — H578 Other specified disorders of eye and adnexa: Secondary | ICD-10-CM | POA: Insufficient documentation

## 2015-02-20 DIAGNOSIS — H5789 Other specified disorders of eye and adnexa: Secondary | ICD-10-CM

## 2015-02-20 DIAGNOSIS — J45909 Unspecified asthma, uncomplicated: Secondary | ICD-10-CM | POA: Insufficient documentation

## 2015-02-20 DIAGNOSIS — Z7982 Long term (current) use of aspirin: Secondary | ICD-10-CM | POA: Insufficient documentation

## 2015-02-20 DIAGNOSIS — Z87891 Personal history of nicotine dependence: Secondary | ICD-10-CM | POA: Insufficient documentation

## 2015-02-20 DIAGNOSIS — M1990 Unspecified osteoarthritis, unspecified site: Secondary | ICD-10-CM | POA: Insufficient documentation

## 2015-02-20 MED ORDER — FLUORESCEIN SODIUM 1 MG OP STRP
1.0000 | ORAL_STRIP | Freq: Once | OPHTHALMIC | Status: AC
  Administered 2015-02-20: 1 via OPHTHALMIC
  Filled 2015-02-20: qty 1

## 2015-02-20 MED ORDER — TETRACAINE HCL 0.5 % OP SOLN
2.0000 [drp] | Freq: Once | OPHTHALMIC | Status: AC
  Administered 2015-02-20: 2 [drp] via OPHTHALMIC
  Filled 2015-02-20: qty 2

## 2015-02-20 NOTE — ED Notes (Signed)
Yesterday pt was cleaning a battery terminal post with sand paper, a gust of wind blew the debris into her eye and she has had severe eye and cheek pain since then. Pt came to ED for relief.

## 2015-02-20 NOTE — ED Notes (Signed)
NAD at this time. Pt is stable and going home.  

## 2015-02-20 NOTE — ED Provider Notes (Signed)
CSN: [id number]     Arrival date & time 02/20/15  1256 History   This chart was scribed for non-physician practitioner, Santiago Glad, PA, working with Lavera Guise, MD by Marica Otter, ED Scribe. This patient was seen in room TR04C/TR04C and the patient's care was started at 1:36 PM.  Chief Complaint  Patient presents with  . Eye Injury   The history is provided by the patient. No language interpreter was used.   PCP: No primary care provider on file. HPI Comments: Sonya Smith is a 54 y.o. female, with PMHx noted below, who presents to the Emergency Department complaining of sudden onset, constant, bilateral eye pain (with left being worse than right), onset yesterday after some debris flew into patient's eye while she was sanding a battery terminal post. Pt reports flushing her eyes with water repeatedly last night and this morning without improvement. Pt further notes applying clear eyes drop without improvement.  Pt denies visual changes, contact lens use, or eye discharge.   Past Medical History  Diagnosis Date  . Chest pain     multiple ED visit, no objective evidence of cardiac or pulmonary causes  of chest pain in past  . Arthritis     of cervical and lumbar spine  . Asthma     undiagnosed, was told by a physician in the ED, patient does not take any inhalers at home.    Past Surgical History  Procedure Laterality Date  . Peri tonsillar abscess  10/11    drainage  . Drainage of peri tonsillar abscess     Family History  Problem Relation Age of Onset  . Heart disease Mother 79  . Diabetes Mother   . Heart disease Maternal Aunt 32   Social History  Substance Use Topics  . Smoking status: Former Smoker    Quit date: 10/09/2003  . Smokeless tobacco: Never Used  . Alcohol Use: No   OB History    No data available     Review of Systems  Constitutional: Negative for fever.  Eyes: Positive for pain (bilateral eyes). Negative for discharge and visual disturbance.    Allergies  Codeine and Darvocet  Home Medications   Prior to Admission medications   Medication Sig Start Date End Date Taking? Authorizing Provider  aspirin 325 MG tablet Take 650 mg by mouth daily.    Historical Provider, MD  aspirin EC 81 MG tablet Take 81 mg by mouth daily.    Historical Provider, MD  HYDROcodone-acetaminophen (NORCO/VICODIN) 5-325 MG per tablet Take 1 tablet by mouth every 6 (six) hours as needed for moderate pain.    Historical Provider, MD  oxyCODONE-acetaminophen (PERCOCET) 5-325 MG per tablet Take 1-2 tablets by mouth every 4 (four) hours as needed. 07/26/14   Donnetta Hutching, MD  promethazine (PHENERGAN) 25 MG tablet Take 1 tablet (25 mg total) by mouth every 6 (six) hours as needed for nausea. 07/26/14   Donnetta Hutching, MD  sulfamethoxazole-trimethoprim (SEPTRA DS) 800-160 MG per tablet Take 1 tablet by mouth every 12 (twelve) hours. Patient not taking: Reported on 07/26/2014 02/18/13   Jillyn Ledger, PA-C   Triage Vitals: BP 162/68 mmHg  Pulse 58  Temp(Src) 97.9 F (36.6 C) (Oral)  Resp 20  SpO2 100% Physical Exam  Constitutional: She is oriented to person, place, and time. She appears well-developed and well-nourished.  HENT:  Head: Normocephalic.  Eyes: EOM and lids are normal. Pupils are equal, round, and reactive to light. Lids are  everted and swept, no foreign bodies found. Right eye exhibits no exudate. Left eye exhibits no exudate. Right conjunctiva is injected (mild). Left conjunctiva is injected (mild).  Slit lamp exam:      The right eye shows no corneal abrasion, no corneal flare, no corneal ulcer and no foreign body.       The left eye shows no corneal abrasion, no corneal flare, no corneal ulcer and no foreign body.  PH Test: Bilateral eyes pH 7 No drainage noted   Neck: Normal range of motion.  Cardiovascular: Normal rate, regular rhythm and normal heart sounds.   Pulmonary/Chest: Effort normal and breath sounds normal.  Abdominal: She exhibits  no distension.  Musculoskeletal: Normal range of motion.  Neurological: She is alert and oriented to person, place, and time.  Psychiatric: She has a normal mood and affect.  Nursing note and vitals reviewed.  ED Course  Procedures (including critical care time) DIAGNOSTIC STUDIES: Oxygen Saturation is 100% on RA, nl by my interpretation.    COORDINATION OF CARE: 1:39 PM: Discussed treatment plan which includes fluorescein eye stain test and pH check of bilateral eyes with pt at bedside; patient verbalizes understanding and agrees with treatment plan.  MDM   Final diagnoses:  None   Patient presents today with eye irritation after some debris flew in her eye last evening.  No signs of infection on exam.  Fluoresein stain negative for Corneal Abrasion or Ulceration.  No foreign bodies.  She denies any vision changes.  Feel that the patient is stable for discharge.  Return precautions given.    I personally performed the services described in this documentation, which was scribed in my presence. The recorded information has been reviewed and is accurate.   Santiago Glad, PA-C 02/21/15 1542  Lavera Guise, MD 02/21/15 [phone]

## 2015-07-08 ENCOUNTER — Encounter (HOSPITAL_COMMUNITY): Payer: Self-pay | Admitting: Cardiology

## 2015-07-08 ENCOUNTER — Emergency Department (HOSPITAL_COMMUNITY)
Admission: EM | Admit: 2015-07-08 | Discharge: 2015-07-08 | Disposition: A | Discharge status: Home / Self Care | Payer: Self-pay | Attending: Emergency Medicine | Admitting: Emergency Medicine

## 2015-07-08 ENCOUNTER — Emergency Department (HOSPITAL_COMMUNITY): Payer: Self-pay

## 2015-07-08 DIAGNOSIS — J45909 Unspecified asthma, uncomplicated: Secondary | ICD-10-CM | POA: Insufficient documentation

## 2015-07-08 DIAGNOSIS — R079 Chest pain, unspecified: Secondary | ICD-10-CM | POA: Insufficient documentation

## 2015-07-08 DIAGNOSIS — Z87891 Personal history of nicotine dependence: Secondary | ICD-10-CM | POA: Insufficient documentation

## 2015-07-08 DIAGNOSIS — Z7982 Long term (current) use of aspirin: Secondary | ICD-10-CM | POA: Insufficient documentation

## 2015-07-08 LAB — BASIC METABOLIC PANEL WITH GFR
Anion gap: 13 (ref 5–15)
BUN: 14 mg/dL (ref 6–20)
CO2: 26 mmol/L (ref 22–32)
Calcium: 9.7 mg/dL (ref 8.9–10.3)
Chloride: 103 mmol/L (ref 101–111)
Creatinine, Ser: 0.75 mg/dL (ref 0.44–1.00)
GFR calc Af Amer: >60 mL/min
GFR calc non Af Amer: >60 mL/min
Glucose, Bld: 86 mg/dL (ref 65–99)
Potassium: 4.6 mmol/L (ref 3.5–5.1)
Sodium: 142 mmol/L (ref 135–145)

## 2015-07-08 LAB — CBC
HCT: 43.6 % (ref 36.0–46.0)
Hemoglobin: 15 g/dL (ref 12.0–15.0)
MCH: 30.1 pg (ref 26.0–34.0)
MCHC: 34.4 g/dL (ref 30.0–36.0)
MCV: 87.4 fL (ref 78.0–100.0)
Platelets: 183 10*3/uL (ref 150–400)
RBC: 4.99 MIL/uL (ref 3.87–5.11)
RDW: 13.5 % (ref 11.5–15.5)
WBC: 8.4 10*3/uL (ref 4.0–10.5)

## 2015-07-08 LAB — I-STAT TROPONIN, ED
Troponin i, poc: 0 ng/mL (ref 0.00–0.08)
Troponin i, poc: 0 ng/mL (ref 0.00–0.08)

## 2015-07-08 MED ORDER — HYDROCODONE-ACETAMINOPHEN 5-325 MG PO TABS
1.0000 | ORAL_TABLET | Freq: Once | ORAL | Status: AC
  Administered 2015-07-08: 1 via ORAL
  Filled 2015-07-08: qty 1

## 2015-07-08 MED ORDER — ASPIRIN 81 MG PO CHEW
162.0000 mg | CHEWABLE_TABLET | Freq: Once | ORAL | Status: AC
  Administered 2015-07-08: 162 mg via ORAL
  Filled 2015-07-08: qty 2

## 2015-07-08 NOTE — ED Notes (Signed)
Pt reports left sided chest pain that started a couple of hours ago. Also reports SOB, with the pain. States she took 2 ASA prior to coming in.

## 2015-07-08 NOTE — ED Provider Notes (Signed)
CSN: [id number]     Arrival date & time 07/08/15  1059 History   First MD Initiated Contact with Patient 07/08/15 1500     Chief Complaint  Patient presents with  . Chest Pain     (Consider location/radiation/quality/duration/timing/severity/associated sxs/prior Treatment) HPI Comments: 55 year old female who presents with chest pain. Patient reports that a few hours ago, she was loading things into a car when she began having achiness in her central chest. She gradually developed left-sided chest pain that wraps around to her left upper back. The pain became excruciating which is why she presented to the ED. The pain is worse with deep inspiration and she endorses mild associated shortness of breath because of the pain. Mild nausea but no vomiting. She denies any fevers, cough/cold symptoms, or recent illness. She did take 2 baby aspirin prior to coming in. Patient does note that she was lifting a heavy cooler prior to onset of pain. No recent travel, OCP use, or history of cancer.   Family history notable for mother with MI during surgery in her 55s.   Patient is a 56 y.o. female presenting with chest pain. The history is provided by the patient.  Chest Pain   Past Medical History  Diagnosis Date  . Chest pain     multiple ED visit, no objective evidence of cardiac or pulmonary causes  of chest pain in past  . Arthritis     of cervical and lumbar spine  . Asthma     undiagnosed, was told by a physician in the ED, patient does not take any inhalers at home.    Past Surgical History  Procedure Laterality Date  . Peri tonsillar abscess  10/11    drainage  . Drainage of peri tonsillar abscess     Family History  Problem Relation Age of Onset  . Heart disease Mother 60  . Diabetes Mother   . Heart disease Maternal Aunt 59   Social History  Substance Use Topics  . Smoking status: Former Smoker    Quit date: 10/09/2003  . Smokeless tobacco: Never Used  . Alcohol Use: No   OB  History    No data available     Review of Systems  Cardiovascular: Positive for chest pain.   10 Systems reviewed and are negative for acute change except as noted in the HPI.    Allergies  Codeine and Darvocet  Home Medications   Prior to Admission medications   Medication Sig Start Date End Date Taking? Authorizing Provider  aspirin EC 81 MG tablet Take 81 mg by mouth daily.   Yes Historical Provider, MD  oxyCODONE-acetaminophen (PERCOCET) 5-325 MG per tablet Take 1-2 tablets by mouth every 4 (four) hours as needed. Patient not taking: Reported on 07/08/2015 07/26/14   Donnetta Hutching, MD  promethazine (PHENERGAN) 25 MG tablet Take 1 tablet (25 mg total) by mouth every 6 (six) hours as needed for nausea. Patient not taking: Reported on 07/08/2015 07/26/14   Donnetta Hutching, MD  sulfamethoxazole-trimethoprim [facility] DS) 800-160 MG per tablet Take 1 tablet by mouth every 12 (twelve) hours. Patient not taking: Reported on 07/26/2014 02/18/13   Jillyn Ledger, PA-C   BP 115/56 mmHg  Pulse 69  Temp(Src) 97.6 F (36.4 C) (Oral)  Resp 16  Wt 243 lb (110.224 kg)  SpO2 98%  LMP 07/07/2015 Physical Exam  Constitutional: She is oriented to person, place, and time. She appears well-developed and well-nourished. No distress.  HENT:  Head: Normocephalic  and atraumatic.  Moist mucous membranes  Eyes: Conjunctivae are normal. Pupils are equal, round, and reactive to light.  Neck: Neck supple.  Cardiovascular: Normal rate, regular rhythm and normal heart sounds.   No murmur heard. Pulmonary/Chest: Effort normal and breath sounds normal.  Abdominal: Soft. Bowel sounds are normal. She exhibits no distension. There is no tenderness.  Musculoskeletal: She exhibits no edema.  TTP left mid-thoracic paraspinal muscles just below scapula  Neurological: She is alert and oriented to person, place, and time.  Fluent speech  Skin: Skin is warm and dry.  Psychiatric: She has a normal mood and affect. Judgment  normal.  Nursing note and vitals reviewed.   ED Course  Procedures (including critical care time) Labs Review Labs Reviewed  BASIC METABOLIC PANEL  CBC  I-STAT TROPOININ, ED  Rosezena Sensor, ED    Imaging Review Dg Chest 2 View  07/08/2015  CLINICAL DATA:  Chest pain EXAM: CHEST  2 VIEW COMPARISON:  02/18/2013 FINDINGS: The heart size and mediastinal contours are within normal limits. Both lungs are clear. The visualized skeletal structures are unremarkable. Thoracic dextroscoliosis unchanged. IMPRESSION: No active cardiopulmonary disease. Electronically Signed   By: Marlan Palau M.D.   On: 07/08/2015 11:52   I have personally reviewed and evaluated these lab results as part of my medical decision-making.   EKG Interpretation   Date/Time:  Monday July 08 2015 11:10:57 EST Ventricular Rate:  66 PR Interval:  140 QRS Duration: 94 QT Interval:  410 QTC Calculation: 429 R Axis:   76 Text Interpretation:  Normal sinus rhythm Normal ECG Confirmed by Juleen China   MD, STEPHEN ([id number]) on 07/08/2015 2:29:59 PM     Medications  aspirin chewable tablet 162 mg (162 mg Oral Given 07/08/15 1515)  HYDROcodone-acetaminophen (NORCO/VICODIN) 5-325 MG per tablet 1 tablet (1 tablet Oral Given 07/08/15 1658)    MDM   Final diagnoses:  Chest pain, unspecified chest pain type   Pt p/w chest pain that radiates to her L thoracic back that began while loading stuff into a car earlier today. On exam, she was well-appearing with normal vital signs. EKG on arrival showed normal sinus rhythm with no changes compared to previous. On exam, she had exquisite tenderness to light palpation of left midthoracic paraspinal muscles. She stated that palpating the muscles reproduced her same pain. Gave the patient 2 more baby aspirin and obtained above lab work including serial troponins as well as chest x-ray. Chest x-ray was negative for acute process. Lab work was unremarkable.  The patient has no risk factors for  PE and  Wells score of 0. She has been here several times for chest pain and has had previous negative CTA chest. Given that her pain is reproducible on exam and she has normal VS, I feel PE is very unlikely. Her HEART score is 2 thus I feel she is safe for discharge. I have discussed supportive care instructions as well as return precautions. Patient voiced understanding and was discharged in satisfactory condition.   Laurence Spates, MD 07/09/15 [phone]

## 2015-07-08 NOTE — ED Notes (Signed)
Per Velna Hatchet from main lab, patient's blood was clotted and a CBC needs to be redrawn; Martie Lee, RN aware

## 2015-11-04 ENCOUNTER — Encounter (HOSPITAL_COMMUNITY): Payer: Self-pay | Admitting: Nurse Practitioner

## 2015-11-04 ENCOUNTER — Emergency Department (HOSPITAL_COMMUNITY)
Admission: EM | Admit: 2015-11-04 | Discharge: 2015-11-04 | Disposition: A | Discharge status: Home / Self Care | Payer: Self-pay | Attending: Emergency Medicine | Admitting: Emergency Medicine

## 2015-11-04 ENCOUNTER — Emergency Department (HOSPITAL_COMMUNITY): Payer: Self-pay

## 2015-11-04 DIAGNOSIS — Z7982 Long term (current) use of aspirin: Secondary | ICD-10-CM | POA: Insufficient documentation

## 2015-11-04 DIAGNOSIS — Z87891 Personal history of nicotine dependence: Secondary | ICD-10-CM | POA: Insufficient documentation

## 2015-11-04 DIAGNOSIS — Z79899 Other long term (current) drug therapy: Secondary | ICD-10-CM | POA: Insufficient documentation

## 2015-11-04 DIAGNOSIS — M7989 Other specified soft tissue disorders: Secondary | ICD-10-CM | POA: Insufficient documentation

## 2015-11-04 DIAGNOSIS — J45909 Unspecified asthma, uncomplicated: Secondary | ICD-10-CM | POA: Insufficient documentation

## 2015-11-04 LAB — BASIC METABOLIC PANEL
Anion gap: 8 (ref 5–15)
BUN: 18 mg/dL (ref 6–20)
CALCIUM: 9.7 mg/dL (ref 8.9–10.3)
CHLORIDE: 103 mmol/L (ref 101–111)
CO2: 26 mmol/L (ref 22–32)
CREATININE: 0.83 mg/dL (ref 0.44–1.00)
GFR calc non Af Amer: >60 mL/min (ref >60)
GLUCOSE: 102 mg/dL — AB (ref 65–99)
Potassium: 3.7 mmol/L (ref 3.5–5.1)
Sodium: 137 mmol/L (ref 135–145)

## 2015-11-04 LAB — CBC
HCT: 43.7 % (ref 36.0–46.0)
Hemoglobin: 14.5 g/dL (ref 12.0–15.0)
MCH: 29.1 pg (ref 26.0–34.0)
MCHC: 33.2 g/dL (ref 30.0–36.0)
MCV: 87.6 fL (ref 78.0–100.0)
PLATELETS: 185 10*3/uL (ref 150–400)
RBC: 4.99 MIL/uL (ref 3.87–5.11)
RDW: 13.7 % (ref 11.5–15.5)
WBC: 9.7 10*3/uL (ref 4.0–10.5)

## 2015-11-04 LAB — I-STAT TROPONIN, ED
TROPONIN I, POC: 0 ng/mL (ref 0.00–0.08)
Troponin i, poc: 0 ng/mL (ref 0.00–0.08)

## 2015-11-04 MED ORDER — SULFAMETHOXAZOLE-TRIMETHOPRIM 800-160 MG PO TABS: 1.0000 | ORAL_TABLET | Freq: Two times a day (BID) | ORAL | Status: DC

## 2015-11-04 MED ORDER — ACETAMINOPHEN 500 MG PO TABS
1000.0000 mg | ORAL_TABLET | Freq: Once | ORAL | Status: DC
  Filled 2015-11-04: qty 2

## 2015-11-04 MED ORDER — OXYCODONE-ACETAMINOPHEN 5-325 MG PO TABS
1.0000 | ORAL_TABLET | Freq: Once | ORAL | Status: AC
  Administered 2015-11-04: 1 via ORAL
  Filled 2015-11-04: qty 1

## 2015-11-04 NOTE — ED Notes (Signed)
Pt ambulated to and from restroom without difficulty.   

## 2015-11-04 NOTE — ED Notes (Signed)
She c/o 10 day history of RLE pain, redness, and swelling after hitting the leg on a metal corner. She has tried ice, heat, ibuprofen with no relief. She also c/o an episode of sharp piercing L sided CP that made her feel SOB on Saturday night. Denies cp now

## 2015-11-04 NOTE — ED Notes (Signed)
Dr. Nanavati at bedside 

## 2015-11-04 NOTE — ED Notes (Signed)
Pts left calf is red and hot to the touch.

## 2015-11-04 NOTE — ED Notes (Signed)
Patient transported to X-ray 

## 2015-11-04 NOTE — ED Provider Notes (Signed)
CSN: [id number]     Arrival date & time 11/04/15  1443 History   First MD Initiated Contact with Patient 11/04/15 1907     Chief Complaint  Patient presents with  . Leg Injury    Patient is a 55 y.o. female presenting with leg pain. The history is provided by the patient.  Leg Pain Location:  Leg Time since incident:  10 days Injury: yes   Mechanism of injury comment:  Ran into a trailer hitch with her LLE Leg location:  L lower leg Pain details:    Quality:  Aching   Radiates to:  Groin   Severity:  Severe   Onset quality:  Gradual   Duration:  10 days   Timing:  Constant   Progression:  Worsening Chronicity:  New Relieved by:  Nothing Worsened by:  Bearing weight Ineffective treatments:  None tried Associated symptoms: swelling   Associated symptoms: no decreased ROM, no fever, no muscle weakness and no numbness   Risk factors: obesity     Past Medical History  Diagnosis Date  . Chest pain     multiple ED visit, no objective evidence of cardiac or pulmonary causes  of chest pain in past  . Arthritis     of cervical and lumbar spine  . Asthma     undiagnosed, was told by a physician in the ED, patient does not take any inhalers at home.    Past Surgical History  Procedure Laterality Date  . Peri tonsillar abscess  10/11    drainage  . Drainage of peri tonsillar abscess     Family History  Problem Relation Age of Onset  . Heart disease Mother 3940  . Diabetes Mother   . Heart disease Maternal Aunt 240   Social History  Substance Use Topics  . Smoking status: Former Smoker    Quit date: 10/09/2003  . Smokeless tobacco: Never Used  . Alcohol Use: No   OB History    No data available     Review of Systems  Constitutional: Positive for chills. Negative for fever.  HENT: Negative for congestion.   Eyes: Negative for redness.  Respiratory: Positive for shortness of breath.   Cardiovascular: Positive for chest pain.  Genitourinary: Negative for flank pain.   Musculoskeletal: Negative for neck stiffness.  Skin: Positive for rash.  Neurological: Negative for speech difficulty.  Psychiatric/Behavioral: Negative for confusion.    Allergies  Codeine and Darvocet  Home Medications   Prior to Admission medications   Medication Sig Start Date End Date Taking? Authorizing Provider  aspirin EC 81 MG tablet Take 81 mg by mouth daily.    Historical Provider, MD  oxyCODONE-acetaminophen (PERCOCET) 5-325 MG per tablet Take 1-2 tablets by mouth every 4 (four) hours as needed. Patient not taking: Reported on 07/08/2015 07/26/14   Donnetta HutchingBrian Cook, MD  promethazine (PHENERGAN) 25 MG tablet Take 1 tablet (25 mg total) by mouth every 6 (six) hours as needed for nausea. Patient not taking: Reported on 07/08/2015 07/26/14   Donnetta HutchingBrian Cook, MD  sulfamethoxazole-trimethoprim [facility](SEPTRA DS) 800-160 MG per tablet Take 1 tablet by mouth every 12 (twelve) hours. Patient not taking: Reported on 07/26/2014 02/18/13   Jillyn LedgerJessica K Palmer, PA-C   BP 133/74 mmHg  Pulse 66  Temp(Src) 97.9 F (36.6 C) (Oral)  Resp 17  SpO2 97% Physical Exam  Constitutional: She is oriented to person, place, and time. She appears well-developed and well-nourished. She is cooperative. No distress.  HENT:  Head:  Normocephalic and atraumatic.  Right Ear: External ear normal.  Left Ear: External ear normal.  Neck: Normal range of motion and phonation normal.  Cardiovascular: Normal rate and regular rhythm.   Pulses:      Radial pulses are 2+ on the right side, and 2+ on the left side.       Dorsalis pedis pulses are 2+ on the right side, and 2+ on the left side.  Pulmonary/Chest: Effort normal and breath sounds normal. No respiratory distress. She has no wheezes. She has no rales.  Abdominal: Soft. She exhibits no distension. There is no tenderness. There is no rebound and no guarding.  Musculoskeletal:       Left lower leg: She exhibits tenderness and swelling.  Neurological: She is alert and oriented to  person, place, and time.  Skin: Skin is warm and dry. No rash noted. She is not diaphoretic.    ED Course  Procedures (including critical care time) Labs Review Labs Reviewed  BASIC METABOLIC PANEL - Abnormal; Notable for the following:    Glucose, Bld 102 (*)    All other components within normal limits  CBC  I-STAT TROPOININ, ED  I-STAT TROPOININ, ED    Imaging Review Dg Tibia/fibula Left  11/04/2015  CLINICAL DATA:  Left lower extremity age pain, swelling and redness for 10 days. EXAM: LEFT TIBIA AND FIBULA - 2 VIEW COMPARISON:  None. FINDINGS: The tibia and fibula are intact. Moderate degenerative changes are noted at the knee joint. A moderate sized calcaneal heel spur is noted. IMPRESSION: No significant bony findings. Electronically Signed   By: Rudie Meyer M.D.   On: 11/04/2015 19:39   I have personally reviewed and evaluated these images and lab results as part of my medical decision-making.   EKG Interpretation   Date/Time:  Monday November 04 2015 16:14:59 EDT Ventricular Rate:  65 PR Interval:  136 QRS Duration: 96 QT Interval:  420 QTC Calculation: 436 R Axis:   78 Text Interpretation:  Normal sinus rhythm with sinus arrhythmia Normal ECG  No significant change since last tracing No acute changes Confirmed by  Rhunette Croft, MD, Janey Genta [phone]) on 11/04/2015 9:31:37 PM      MDM   Final diagnoses:  Leg swelling   55 y.o. female with a history of asthma, arthritis presents to the ED due to LLE pain, swelling, redness. Remainder of HPI, ROS, and Exam as above. She had a brief episode of chest pain and dyspnea 3 days ago that lasted 10 minutes. No further episodes. Feels no chest pain now.   DDx includes cellulitis vs DVT vs trauma. XR of the LLE obtained showed no bony abnormalities.   Could not get a DVT study due to time of night.   Dr. Rhunette Croft, attending physician, evaluated the patient and discharged her. He made arrangements for outpatient imaging.  If no clot,  would treat as cellulitis. Bactrim RX given.   Return precautions given. Follow up with PCP. Discharged home.   Case managed in conjunction with my attending, Dr. Rhunette Croft.  Maxine Glenn, MD 11/05/15 6045  Maxine Glenn, MD 11/07/15 4098  Derwood Kaplan, MD 11/15/15 1191  Derwood Kaplan, MD 12/19/15 1143

## 2015-11-04 NOTE — ED Notes (Signed)
Pt states "I can't take tylenol it makes my stomach hurt"

## 2015-11-04 NOTE — Discharge Instructions (Signed)
Expect a call for ULTRASOUND of the leg tomorrow. If the ultrasound shows clot -come to the ER. If the ultrasound shows no blood clot - take the antibiotics and RETURN TO THE ER IF THERE IS INCREASED PAIN, REDNESS, PUS COMING OUT from the wound site.  RICE for Routine Care of Injuries Theroutine careofmanyinjuriesincludes rest, ice, compression, and elevation (RICE therapy). RICE therapy is often recommended for injuries to soft tissues, such as a muscle strain, ligament injuries, bruises, and overuse injuries. It can also be used for some bony injuries. Using RICE therapy can help to relieve pain, lessen swelling, and enable your body to heal. Rest Rest is required to allow your body to heal. This usually involves reducing your normal activities and avoiding use of the injured part of your body. Generally, you can return to your normal activities when you are comfortable and have been given permission by your health care provider. Ice Icing your injury helps to keep the swelling down, and it lessens pain. Do not apply ice directly to your skin.  Put ice in a plastic bag.  Place a towel between your skin and the bag.  Leave the ice on for 20 minutes, 2-3 times a day. Do this for as long as you are directed by your health care provider. Compression Compression means putting pressure on the injured area. Compression helps to keep swelling down, gives support, and helps with discomfort. Compression may be done with an elastic bandage. If an elastic bandage has been applied, follow these general tips:  Remove and reapply the bandage every 3-4 hours or as directed by your health care provider.  Make sure the bandage is not wrapped too tightly, because this can cut off circulation. If part of your body beyond the bandage becomes blue, numb, cold, swollen, or more painful, your bandage is most likely too tight. If this occurs, remove your bandage and reapply it more loosely.  See your health  care provider if the bandage seems to be making your problems worse rather than better. Elevation Elevation means keeping the injured area raised. This helps to lessen swelling and decrease pain. If possible, your injured area should be elevated at or above the level of your heart or the center of your chest. WHEN SHOULD I SEEK MEDICAL CARE? You should seek medical care if:  Your pain and swelling continue.  Your symptoms are getting worse rather than improving. These symptoms may indicate that further evaluation or further X-rays are needed. Sometimes, X-rays may not show a small broken bone (fracture) until a number of days later. Make a follow-up appointment with your health care provider. WHEN SHOULD I SEEK IMMEDIATE MEDICAL CARE? You should seek immediate medical care if:  You have sudden severe pain at or below the area of your injury.  You have redness or increased swelling around your injury.  You have tingling or numbness at or below the area of your injury that does not improve after you remove the elastic bandage.   This information is not intended to replace advice given to you by your health care provider. Make sure you discuss any questions you have with your health care provider.   Document Released: 08/30/2000 Document Revised: 02/06/2015 Document Reviewed: 04/25/2014 Elsevier Interactive Patient Education 2016 [employer] Cellulitis Cellulitis is an infection of the skin and the tissue beneath it. The infected area is usually red and tender. Cellulitis occurs most often in the arms and lower legs.  CAUSES  Cellulitis is caused  by bacteria that enter the skin through cracks or cuts in the skin. The most common types of bacteria that cause cellulitis are staphylococci and streptococci. SIGNS AND SYMPTOMS   Redness and warmth.  Swelling.  Tenderness or pain.  Fever. DIAGNOSIS  Your health care provider can usually determine what is wrong based on a physical exam.  Blood tests may also be done. TREATMENT  Treatment usually involves taking an antibiotic medicine. HOME CARE INSTRUCTIONS   Take your antibiotic medicine as directed by your health care provider. Finish the antibiotic even if you start to feel better.  Keep the infected arm or leg elevated to reduce swelling.  Apply a warm cloth to the affected area up to 4 times per day to relieve pain.  Take medicines only as directed by your health care provider.  Keep all follow-up visits as directed by your health care provider. SEEK MEDICAL CARE IF:   You notice red streaks coming from the infected area.  Your red area gets larger or turns dark in color.  Your bone or joint underneath the infected area becomes painful after the skin has healed.  Your infection returns in the same area or another area.  You notice a swollen bump in the infected area.  You develop new symptoms.  You have a fever. SEEK IMMEDIATE MEDICAL CARE IF:   You feel very sleepy.  You develop vomiting or diarrhea.  You have a general ill feeling (malaise) with muscle aches and pains.   This information is not intended to replace advice given to you by your health care provider. Make sure you discuss any questions you have with your health care provider.   Document Released: 02/25/2005 Document Revised: 02/06/2015 Document Reviewed: 08/03/2011 Elsevier Interactive Patient Education [employer]

## 2015-11-05 ENCOUNTER — Ambulatory Visit (HOSPITAL_COMMUNITY)
Admission: RE | Admit: 2015-11-05 | Discharge: 2015-11-05 | Disposition: A | Discharge status: Home / Self Care | Payer: Self-pay | Source: Ambulatory Visit | Attending: Emergency Medicine | Admitting: Emergency Medicine

## 2015-11-05 DIAGNOSIS — M79609 Pain in unspecified limb: Secondary | ICD-10-CM

## 2015-11-05 DIAGNOSIS — M7989 Other specified soft tissue disorders: Secondary | ICD-10-CM | POA: Insufficient documentation

## 2015-11-05 DIAGNOSIS — R590 Localized enlarged lymph nodes: Secondary | ICD-10-CM | POA: Insufficient documentation

## 2015-11-05 NOTE — Progress Notes (Signed)
VASCULAR LAB PRELIMINARY  PRELIMINARY  PRELIMINARY  PRELIMINARY  Left lower extremity venous duplex completed.    Preliminary report:  Left:  No evidence of DVT, superficial thrombosis, or Baker's cyst. Mild enlargement of the left inguinal lymph nodes  Ranbir Chew, RVS 11/05/2015, 8:56 AM

## 2015-11-05 NOTE — ED Notes (Signed)
Pt came in today to get her home medication list updated. Task performed by this RN.

## 2015-11-07 ENCOUNTER — Telehealth (HOSPITAL_COMMUNITY): Payer: Self-pay | Admitting: Emergency Medicine

## 2015-11-13 ENCOUNTER — Emergency Department (EMERGENCY_DEPARTMENT_HOSPITAL)
Admit: 2015-11-13 | Discharge: 2015-11-13 | Disposition: A | Discharge status: Home / Self Care | Payer: Self-pay | Attending: Emergency Medicine | Admitting: Emergency Medicine

## 2015-11-13 ENCOUNTER — Encounter (HOSPITAL_COMMUNITY): Payer: Self-pay | Admitting: Family Medicine

## 2015-11-13 ENCOUNTER — Emergency Department (HOSPITAL_COMMUNITY)
Admission: EM | Admit: 2015-11-13 | Discharge: 2015-11-13 | Disposition: A | Discharge status: Home / Self Care | Payer: Self-pay | Attending: Emergency Medicine | Admitting: Emergency Medicine

## 2015-11-13 DIAGNOSIS — Z7982 Long term (current) use of aspirin: Secondary | ICD-10-CM | POA: Insufficient documentation

## 2015-11-13 DIAGNOSIS — R52 Pain, unspecified: Secondary | ICD-10-CM

## 2015-11-13 DIAGNOSIS — L03116 Cellulitis of left lower limb: Secondary | ICD-10-CM | POA: Insufficient documentation

## 2015-11-13 DIAGNOSIS — Z87891 Personal history of nicotine dependence: Secondary | ICD-10-CM | POA: Insufficient documentation

## 2015-11-13 DIAGNOSIS — J45909 Unspecified asthma, uncomplicated: Secondary | ICD-10-CM | POA: Insufficient documentation

## 2015-11-13 LAB — BASIC METABOLIC PANEL
ANION GAP: 8 (ref 5–15)
BUN: 12 mg/dL (ref 6–20)
CHLORIDE: 106 mmol/L (ref 101–111)
CO2: 24 mmol/L (ref 22–32)
Calcium: 8.9 mg/dL (ref 8.9–10.3)
Creatinine, Ser: 0.64 mg/dL (ref 0.44–1.00)
GFR calc Af Amer: >60 mL/min (ref >60)
GLUCOSE: 89 mg/dL (ref 65–99)
POTASSIUM: 4.5 mmol/L (ref 3.5–5.1)
SODIUM: 138 mmol/L (ref 135–145)

## 2015-11-13 LAB — CBC WITH DIFFERENTIAL/PLATELET
BASOS ABS: 0 10*3/uL (ref 0.0–0.1)
Basophils Relative: 1 %
EOS PCT: 2 %
Eosinophils Absolute: 0.1 10*3/uL (ref 0.0–0.7)
HCT: 41.7 % (ref 36.0–46.0)
HEMOGLOBIN: 13.8 g/dL (ref 12.0–15.0)
LYMPHS ABS: 2 10*3/uL (ref 0.7–4.0)
LYMPHS PCT: 31 %
MCH: 29 pg (ref 26.0–34.0)
MCHC: 33.1 g/dL (ref 30.0–36.0)
MCV: 87.6 fL (ref 78.0–100.0)
Monocytes Absolute: 0.5 10*3/uL (ref 0.1–1.0)
Monocytes Relative: 7 %
NEUTROS PCT: 59 %
Neutro Abs: 3.9 10*3/uL (ref 1.7–7.7)
PLATELETS: 168 10*3/uL (ref 150–400)
RBC: 4.76 MIL/uL (ref 3.87–5.11)
RDW: 13.5 % (ref 11.5–15.5)
WBC: 6.5 10*3/uL (ref 4.0–10.5)

## 2015-11-13 MED ORDER — ONDANSETRON HCL 4 MG PO TABS: 4.0000 mg | ORAL_TABLET | Freq: Three times a day (TID) | ORAL | Status: DC | PRN

## 2015-11-13 MED ORDER — CEPHALEXIN 500 MG PO CAPS: 500.0000 mg | ORAL_CAPSULE | Freq: Four times a day (QID) | ORAL | Status: DC

## 2015-11-13 MED ORDER — ONDANSETRON HCL 4 MG/2ML IJ SOLN
4.0000 mg | Freq: Once | INTRAMUSCULAR | Status: AC
  Administered 2015-11-13: 4 mg via INTRAVENOUS
  Filled 2015-11-13: qty 2

## 2015-11-13 MED ORDER — CEFAZOLIN SODIUM 1-5 GM-% IV SOLN
1.0000 g | Freq: Once | INTRAVENOUS | Status: AC
  Administered 2015-11-13: 1 g via INTRAVENOUS
  Filled 2015-11-13: qty 50

## 2015-11-13 MED ORDER — OXYCODONE-ACETAMINOPHEN 5-325 MG PO TABS: ORAL_TABLET | ORAL | Status: DC

## 2015-11-13 MED ORDER — MORPHINE SULFATE (PF) 4 MG/ML IV SOLN
4.0000 mg | Freq: Once | INTRAVENOUS | Status: AC
  Administered 2015-11-13: 4 mg via INTRAVENOUS
  Filled 2015-11-13: qty 1

## 2015-11-13 NOTE — Progress Notes (Signed)
VASCULAR LAB PRELIMINARY  PRELIMINARY  PRELIMINARY  PRELIMINARY  Left lower extremity venous duplex completed.    Preliminary report:  Left:  No evidence of DVT, superficial thrombosis, or Baker's cyst. Enlargement of the inguinal lymph nodes noted  Sonya Smith, RVS 11/13/2015, 12:48 PM

## 2015-11-13 NOTE — ED Provider Notes (Signed)
CSN: [id number]     Arrival date & time 11/13/15  [id number] History   First MD Initiated Contact with Patient 11/13/15 1038     Chief Complaint  Patient presents with  . Leg Pain  . Wound Check     (Consider location/radiation/quality/duration/timing/severity/associated sxs/prior Treatment) HPI   Blood pressure 136/83, pulse 75, temperature 98 F (36.7 C), temperature source Oral, resp. rate 16, height 5\' 6"  (1.676 m), weight 102.059 kg, SpO2 96 %.  Sonya Smith is a 55 y.o. female complaining of Complaining of worsening erythema, swelling and pain to left lower extremity with associated subjective fever. States that symptoms started greater than 20 days ago, initially she hit the left shin on a trailer hitch, there was no break in the skin at that time, patient was seen and evaluated on June 5, she had a negative x-ray and DVT study at that time. Patient was started on Bactrim, she completed the course of yesterday and states that symptoms have worsened. She denies chest pain, shortness of breath, nausea, vomiting. States that she's been trying to deep active and move around to prevent blood clots. No history of diabetes but she is uninsured and has no primary care physician.    Past Medical History  Diagnosis Date  . Chest pain     multiple ED visit, no objective evidence of cardiac or pulmonary causes  of chest pain in past  . Arthritis     of cervical and lumbar spine  . Asthma     undiagnosed, was told by a physician in the ED, patient does not take any inhalers at home.    Past Surgical History  Procedure Laterality Date  . Peri tonsillar abscess  10/11    drainage  . Drainage of peri tonsillar abscess     Family History  Problem Relation Age of Onset  . Heart disease Mother 7340  . Diabetes Mother   . Heart disease Maternal Aunt 2940   Social History  Substance Use Topics  . Smoking status: Former Smoker    Quit date: 10/09/2003  . Smokeless tobacco: Never Used  .  Alcohol Use: No   OB History    No data available     Review of Systems  10 systems reviewed and found to be negative, except as noted in the HPI.  Allergies  Codeine and Darvocet  Home Medications   Prior to Admission medications   Medication Sig Start Date End Date Taking? Authorizing Provider  aspirin EC 81 MG tablet Take 81 mg by mouth daily.   Yes Historical Provider, MD  ibuprofen (ADVIL,MOTRIN) 200 MG tablet Take 600 mg by mouth every 6 (six) hours as needed (pain).   Yes Historical Provider, MD  cephALEXin (KEFLEX) 500 MG capsule Take 1 capsule (500 mg total) by mouth 4 (four) times daily. 11/13/15   Francene Mcerlean, PA-C  ondansetron (ZOFRAN) 4 MG tablet Take 1 tablet (4 mg total) by mouth every 8 (eight) hours as needed for nausea or vomiting. 11/13/15   Joni ReiningNicole Gavinn Collard, PA-C  oxyCODONE-acetaminophen (PERCOCET/ROXICET) 5-325 MG tablet 1 to 2 tabs PO q6hrs  PRN for pain 11/13/15   Joni ReiningNicole Khristen Cheyney, PA-C  sulfamethoxazole-trimethoprim (BACTRIM DS,SEPTRA DS) 800-160 MG tablet Take 1 tablet by mouth 2 (two) times daily. Patient not taking: Reported on 11/13/2015 11/04/15   Derwood KaplanAnkit Nanavati, MD   BP 119/55 mmHg  Pulse 63  Temp(Src) 98.1 F (36.7 C) (Oral)  Resp 16  Ht 5\' 6"  (1.676 m)  Wt  102.059 kg  BMI 36.33 kg/m2  SpO2 97% Physical Exam  Constitutional: She is oriented to person, place, and time. She appears well-developed and well-nourished. No distress.  HENT:  Head: Normocephalic and atraumatic.  Mouth/Throat: Oropharynx is clear and moist.  Eyes: Conjunctivae and EOM are normal. Pupils are equal, round, and reactive to light.  Neck: Normal range of motion.  Cardiovascular: Normal rate, regular rhythm and intact distal pulses.   Pulmonary/Chest: Effort normal and breath sounds normal.  Abdominal: Soft. There is no tenderness.  Musculoskeletal: Normal range of motion. She exhibits edema and tenderness.  Lower extremity with warmth and induration greater than 20 cm x 20  cm along the shin, see pictures. Distally neurovascular intact, tender to palpation diffusely along the lower leg inferior to the left knee.  No focal fluctuance, no purulent discharge.  Neurological: She is alert and oriented to person, place, and time.  Skin: She is not diaphoretic.  Psychiatric: She has a normal mood and affect.  Nursing note and vitals reviewed.         ED Course  Procedures (including critical care time) Labs Review Labs Reviewed  CULTURE, BLOOD (ROUTINE X 2)  CULTURE, BLOOD (ROUTINE X 2)  CBC WITH DIFFERENTIAL/PLATELET  BASIC METABOLIC PANEL    Imaging Review No results found. I have personally reviewed and evaluated these images and lab results as part of my medical decision-making.   EKG Interpretation None      MDM   Final diagnoses:  Pain  Cellulitis of left lower extremity without foot    Filed Vitals:   11/13/15 1300 11/13/15 1315 11/13/15 1345 11/13/15 1416  BP: 127/63 119/61 110/67 119/55  Pulse: 58 58 62 63  Temp:    98.1 F (36.7 C)  TempSrc:    Oral  Resp:   16 16  Height:      Weight:      SpO2: 97% 95% 96% 97%    Medications  morphine 4 MG/ML injection 4 mg (4 mg Intravenous Given 11/13/15 1202)  ondansetron (ZOFRAN) injection 4 mg (4 mg Intravenous Given 11/13/15 1202)  ceFAZolin (ANCEF) IVPB 1 g/50 mL premix (0 g Intravenous Stopped 11/13/15 1242)    Sonya Smith is 55 y.o. female presenting with Persistent and worsening left lower extremity erythema and swelling, physical exam is consistent with a cellulitis, patient has finished a course of Bactrim with worsening of symptoms, she is afebrile in the ED but reports a subjective fever at home. Will check lower extremity Doppler, blood cultures are drawn, will check basic blood work and we'll give Ancef.   Venous duplex negative, blood work reassuring with no leukocytosis. Discussed option of coming into the hospital versus discharge, patient states that if it's all the  same she would like to go home. I think this patient is appropriate for discharge to home, will start her on a course of Keflex and bring her back for wound check in 72 hours  Evaluation does not show pathology that would require ongoing emergent intervention or inpatient treatment. Pt is hemodynamically stable and mentating appropriately. Discussed findings and plan with patient/guardian, who agrees with care plan. All questions answered. Return precautions discussed and outpatient follow up given.   Discharge Medication List as of 11/13/2015  2:01 PM    START taking these medications   Details  cephALEXin (KEFLEX) 500 MG capsule Take 1 capsule (500 mg total) by mouth 4 (four) times daily., Starting 11/13/2015, Until Discontinued, Print    ondansetron (  ZOFRAN) 4 MG tablet Take 1 tablet (4 mg total) by mouth every 8 (eight) hours as needed for nausea or vomiting., Starting 11/13/2015, Until Discontinued, Print    oxyCODONE-acetaminophen (PERCOCET/ROXICET) 5-325 MG tablet 1 to 2 tabs PO q6hrs  PRN for pain, Print             Wynetta Emery, PA-C 11/13/15 1513  Rolland Porter, MD 11/23/15 1343

## 2015-11-13 NOTE — ED Notes (Signed)
Pt here for worsening infection to LLE. sts was treated with Bactrim and finished, sts the pain radiates into left groin and nauseous. LLE red and swollen.

## 2015-11-13 NOTE — Discharge Instructions (Signed)
If you see worsening signs of infection (warmth, redness, tenderness, pus, sharp increase in pain, fever, red streaking) immediately return to the emergency department.  Take percocet for breakthrough pain, do not drink alcohol, drive, care for children or do other critical tasks while taking percocet.  Do not hesitate to return to the emergency room for any new, worsening or concerning symptoms.  Please obtain primary care using resource guide below. Let them know that you were seen in the emergency room and that they will need to obtain records for further outpatient management.

## 2015-11-13 NOTE — ED Notes (Signed)
Patient transported to Ultrasound 

## 2015-11-14 ENCOUNTER — Telehealth (HOSPITAL_BASED_OUTPATIENT_CLINIC_OR_DEPARTMENT_OTHER): Payer: Self-pay | Admitting: Emergency Medicine

## 2015-11-14 LAB — BLOOD CULTURE ID PANEL (REFLEXED)
ACINETOBACTER BAUMANNII: NOT DETECTED
CANDIDA KRUSEI: NOT DETECTED
CANDIDA PARAPSILOSIS: NOT DETECTED
CARBAPENEM RESISTANCE: NOT DETECTED
Candida albicans: NOT DETECTED
Candida glabrata: NOT DETECTED
Candida tropicalis: NOT DETECTED
ENTEROBACTERIACEAE SPECIES: NOT DETECTED
ENTEROCOCCUS SPECIES: NOT DETECTED
Enterobacter cloacae complex: NOT DETECTED
Escherichia coli: NOT DETECTED
Haemophilus influenzae: NOT DETECTED
KLEBSIELLA PNEUMONIAE: NOT DETECTED
Klebsiella oxytoca: NOT DETECTED
LISTERIA MONOCYTOGENES: NOT DETECTED
Methicillin resistance: NOT DETECTED
NEISSERIA MENINGITIDIS: NOT DETECTED
PSEUDOMONAS AERUGINOSA: NOT DETECTED
Proteus species: NOT DETECTED
SERRATIA MARCESCENS: NOT DETECTED
STAPHYLOCOCCUS AUREUS BCID: NOT DETECTED
STAPHYLOCOCCUS SPECIES: DETECTED — AB
STREPTOCOCCUS AGALACTIAE: NOT DETECTED
Streptococcus pneumoniae: NOT DETECTED
Streptococcus pyogenes: NOT DETECTED
Streptococcus species: NOT DETECTED
VANCOMYCIN RESISTANCE: NOT DETECTED

## 2015-11-14 NOTE — ED Provider Notes (Signed)
9:40 AM Received phone call from flow manager regarding positive blood culture results.  Chart reviewed. Given cellulitis with positive blood cultures, advised that the patient be notified to return to the emergency department for recheck.  Roxy Horsemanobert Kindrick Lankford, PA-C 11/14/15 0940  Leta BaptistEmily Roe Nguyen, MD 11/20/15 [phone]

## 2015-11-14 NOTE — Telephone Encounter (Signed)
Post ED Visit - Positive Culture Follow-up: Successful Patient Follow-Up  Culture assessed and recommendations reviewed by: []  Enzo BiNathan Batchelder, Pharm.D. []  Celedonio MiyamotoJeremy Frens, Pharm.D., BCPS []  Garvin FilaMike Maccia, Pharm.D. []  Georgina PillionElizabeth Martin, Pharm.D., BCPS []  VillasMinh Pham, 17[address] Rainbow BoulevardPharm.D., BCPS, AAHIVP []  Estella HuskMichelle Turner, Pharm.D., BCPS, AAHIVP []  Tennis Mustassie Stewart, Pharm.D. []  Sherle Poeob Vincent, [place]  Positive blood culture, called critical report of anaerobic bottle x 1 positive for gram + cocci in clusters, staph species []  Patient discharged without antimicrobial prescription and treatment is now indicated []  Organism is resistant to prescribed ED discharge antimicrobial [x]  Patient with positive blood cultures  Changes discussed with ED provider: Roxy Horsemanobert Browning PA Patient needs to return to ED for reeval  Attempting to contact patient to return to ED for reeval   Berle MullMiller, Koreen Lizaola 11/14/2015, 9:45 AM

## 2015-11-16 ENCOUNTER — Observation Stay (HOSPITAL_COMMUNITY)
Admission: EM | Admit: 2015-11-16 | Discharge: 2015-11-17 | Disposition: A | Discharge status: Home / Self Care | Payer: Self-pay | Attending: Internal Medicine | Admitting: Internal Medicine

## 2015-11-16 ENCOUNTER — Encounter (HOSPITAL_COMMUNITY): Payer: Self-pay

## 2015-11-16 DIAGNOSIS — M1990 Unspecified osteoarthritis, unspecified site: Secondary | ICD-10-CM | POA: Insufficient documentation

## 2015-11-16 DIAGNOSIS — Z7982 Long term (current) use of aspirin: Secondary | ICD-10-CM | POA: Insufficient documentation

## 2015-11-16 DIAGNOSIS — L03116 Cellulitis of left lower limb: Principal | ICD-10-CM | POA: Diagnosis present

## 2015-11-16 DIAGNOSIS — Z87891 Personal history of nicotine dependence: Secondary | ICD-10-CM | POA: Insufficient documentation

## 2015-11-16 DIAGNOSIS — B957 Other staphylococcus as the cause of diseases classified elsewhere: Secondary | ICD-10-CM

## 2015-11-16 DIAGNOSIS — J45909 Unspecified asthma, uncomplicated: Secondary | ICD-10-CM | POA: Insufficient documentation

## 2015-11-16 LAB — CBC WITH DIFFERENTIAL/PLATELET
BASOS PCT: 0 %
Basophils Absolute: 0 10*3/uL (ref 0.0–0.1)
Eosinophils Absolute: 0.1 10*3/uL (ref 0.0–0.7)
Eosinophils Relative: 2 %
HCT: 38.7 % (ref 36.0–46.0)
HEMOGLOBIN: 12.7 g/dL (ref 12.0–15.0)
Lymphocytes Relative: 33 %
Lymphs Abs: 2.3 10*3/uL (ref 0.7–4.0)
MCH: 28.6 pg (ref 26.0–34.0)
MCHC: 32.8 g/dL (ref 30.0–36.0)
MCV: 87.2 fL (ref 78.0–100.0)
MONOS PCT: 8 %
Monocytes Absolute: 0.5 10*3/uL (ref 0.1–1.0)
NEUTROS ABS: 3.9 10*3/uL (ref 1.7–7.7)
NEUTROS PCT: 57 %
PLATELETS: 199 10*3/uL (ref 150–400)
RBC: 4.44 MIL/uL (ref 3.87–5.11)
RDW: 13.3 % (ref 11.5–15.5)
WBC: 7 10*3/uL (ref 4.0–10.5)

## 2015-11-16 LAB — CULTURE, BLOOD (ROUTINE X 2)

## 2015-11-16 LAB — BASIC METABOLIC PANEL
Anion gap: 6 (ref 5–15)
BUN: 11 mg/dL (ref 6–20)
CO2: 27 mmol/L (ref 22–32)
Calcium: 9 mg/dL (ref 8.9–10.3)
Chloride: 105 mmol/L (ref 101–111)
Creatinine, Ser: 0.67 mg/dL (ref 0.44–1.00)
GFR calc Af Amer: >60 mL/min (ref >60)
GFR calc non Af Amer: >60 mL/min (ref >60)
Glucose, Bld: 112 mg/dL — ABNORMAL HIGH (ref 65–99)
Potassium: 4.1 mmol/L (ref 3.5–5.1)
Sodium: 138 mmol/L (ref 135–145)

## 2015-11-16 LAB — TSH: TSH: 2.212 u[IU]/mL (ref 0.350–4.500)

## 2015-11-16 MED ORDER — ACETAMINOPHEN 500 MG PO TABS: 1000.0000 mg | ORAL_TABLET | Freq: Four times a day (QID) | ORAL | Status: DC | PRN

## 2015-11-16 MED ORDER — KETOROLAC TROMETHAMINE 30 MG/ML IJ SOLN: 30.0000 mg | Freq: Four times a day (QID) | INTRAMUSCULAR | Status: DC | PRN

## 2015-11-16 MED ORDER — KETOROLAC TROMETHAMINE 30 MG/ML IJ SOLN
30.0000 mg | Freq: Four times a day (QID) | INTRAMUSCULAR | Status: DC | PRN
  Administered 2015-11-16: 30 mg via INTRAVENOUS
  Filled 2015-11-16: qty 1

## 2015-11-16 MED ORDER — ENOXAPARIN SODIUM 60 MG/0.6ML ~~LOC~~ SOLN
50.0000 mg | SUBCUTANEOUS | Status: DC
  Administered 2015-11-16: 50 mg via SUBCUTANEOUS
  Filled 2015-11-16: qty 0.6

## 2015-11-16 MED ORDER — OXYCODONE-ACETAMINOPHEN 5-325 MG PO TABS
1.0000 | ORAL_TABLET | Freq: Four times a day (QID) | ORAL | Status: DC | PRN
  Administered 2015-11-16 – 2015-11-17 (×2): 1 via ORAL
  Filled 2015-11-16 (×2): qty 1

## 2015-11-16 MED ORDER — OXYCODONE-ACETAMINOPHEN 5-325 MG PO TABS
1.0000 | ORAL_TABLET | Freq: Once | ORAL | Status: AC
  Administered 2015-11-16: 1 via ORAL
  Filled 2015-11-16: qty 1

## 2015-11-16 MED ORDER — CEPHALEXIN 500 MG PO CAPS
500.0000 mg | ORAL_CAPSULE | Freq: Four times a day (QID) | ORAL | Status: DC
  Administered 2015-11-16 – 2015-11-17 (×4): 500 mg via ORAL
  Filled 2015-11-16: qty 2
  Filled 2015-11-16 (×3): qty 1

## 2015-11-16 NOTE — ED Notes (Signed)
Pt ambulatory to restroom

## 2015-11-16 NOTE — ED Notes (Signed)
Patient here as scheduled for left lower leg wound recheck. Taking antibiotics as scheduled. Minimal redness noted, wound remains in marked area to leg

## 2015-11-16 NOTE — ED Provider Notes (Signed)
55 year old female presents with cellulitis of her left lower extremity and a positive blood culture. Patient was initially seen in fast track. Her infection does seem to be resolving per patient and chart review however patient is complaining of ongoing pain and difficulty ambulating. BMP is unremarkable. CBC is pending. In the setting of positive blood culture and ongoing infection will consult hospitalist for admission for further management.   Bethel BornKelly Marie Gekas, PA-C 11/16/15 1235  Vanetta MuldersScott Zackowski, MD 11/19/15 [phone]

## 2015-11-16 NOTE — H&P (Signed)
Date: 11/16/2015               Patient Name:  Sonya Smith MRN: [id number]  DOB: 07-15-1960 Age / Sex: 55 y.o., female   PCP: No Pcp Per Patient         Medical Service: Internal Medicine Teaching Service         Attending Physician: Dr. Earl LagosNischal Narendra, MD    First Contact: Dr. Darreld McleanVishal Gianni Fuchs Pager: [phone]  Second Contact: Dr. Heywood Ilesushil Kevonna Nolte Pager: [phone]       After Hours (After 5p/  First Contact Pager: [phone]  weekends / holidays): Second Contact Pager: [phone]   Chief Complaint: left lower leg pain  History of Present Illness: Ms [name] is a [age] y.o. Female with self-reported history of arthritis, mild asthma, and former tobacco abuse who presented to the ED for reassessment of her left lower leg swelling.   Roughly 3-4 weeks ago, she hit the left shin on a trailer hitch that developed into a "knot" without skin breakdown. She presented to the ED on 6/5 and was discharged on a 7-day course of Bactrim after having reassuring XR and LE Doppler findings. She completed the course, but returned to the ED on 6/14 with worsened symptoms of erythema, swelling, and pain to her LLE. She was given Ancef once and started on Keflex 500 mg QID at that time as well as a prescription of Percocet for pain relief. Blood cultures were also drawn and resulted in 1 of 2 specimens growing Coagulase negative Staphylococcus species. She was asked to return to the ED today for admission to observation and further evaluation. Patient reports subjective fevers at home and some improvement of her cellulitis. She took one dose of Keflex this morning so far.  She denies chest pain, shortness of breath, nausea, vomiting. No history of diabetes but she is uninsured and has no primary care physician.  Social Hx: Former 30 pack year smoking history, quit 10 years ago. Occasional alcohol, no illicit drugs.  Family Hx: Mother DM, Sister HTN, Maternal Aunt stroke  Meds: Current Facility-Administered Medications    Medication Dose Route Frequency Provider Last Rate Last Dose  . cephALEXin (KEFLEX) capsule 500 mg  500 mg Oral QID Beather Arbourushil V Alija Riano, MD   500 mg at 11/16/15 1356  . enoxaparin (LOVENOX) injection 50 mg  50 mg Subcutaneous Q24H Beather Arbourushil V Zamzam Whinery, MD   50 mg at 11/16/15 1507  . ketorolac (TORADOL) 30 MG/ML injection 30 mg  30 mg Intravenous Q6H PRN Darreld McleanVishal Eilyn Polack, MD      . oxyCODONE-acetaminophen (PERCOCET/ROXICET) 5-325 MG per tablet 1 tablet  1 tablet Oral Q6H PRN Darreld McleanVishal Lyah Millirons, MD        Allergies: Allergies as of 11/16/2015 - Review Complete 11/16/2015  Allergen Reaction Noted  . Codeine Nausea And Vomiting 04/30/2011  . Darvocet [propoxyphene n-acetaminophen] Nausea And Vomiting 04/30/2011   Past Medical History  Diagnosis Date  . Chest pain     multiple ED visit, no objective evidence of cardiac or pulmonary causes  of chest pain in past  . Arthritis     of cervical and lumbar spine  . Asthma     undiagnosed, was told by a physician in the ED, patient does not take any inhalers at home.    Past Surgical History  Procedure Laterality Date  . Peri tonsillar abscess  10/11    drainage  . Drainage of peri tonsillar abscess     Family History  Problem  Relation Age of Onset  . Diabetes Mother   . Stroke Maternal Aunt     84s  . Cancer Maternal Uncle     recurrent  . Hypertension Sister    Social History   Social History  . Marital Status: Divorced    Spouse Name: N/A  . Number of Children: N/A  . Years of Education: N/A   Occupational History  . Not on file.   Social History Main Topics  . Smoking status: Former Smoker -- 1.00 packs/day for 30 years    Types: Cigarettes    Quit date: 10/09/2003  . Smokeless tobacco: Never Used  . Alcohol Use: No  . Drug Use: Yes     Comment: occassional marijuana  . Sexual Activity: Not on file   Other Topics Concern  . Not on file   Social History Narrative   Lives in [place], [place] with her boy friend who is disabled. 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 She has a daughter who is independent and provides her money for medicines. 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  No health insurance.     Review of Systems: Review of Systems  Constitutional: Positive for fever. Negative for chills and diaphoresis.  Respiratory: Negative for cough and shortness of breath.   Cardiovascular: Negative for chest pain and palpitations.  Gastrointestinal: Positive for nausea. Negative for vomiting, abdominal pain, diarrhea and constipation.  Genitourinary: Negative for dysuria.  Musculoskeletal: Positive for joint pain and neck pain.  Skin: Positive for rash.       LLE "knots" erythema, pain, and swelling  Neurological: Negative for dizziness and loss of consciousness.  Psychiatric/Behavioral: Negative for substance abuse.     Physical Exam: Blood pressure 138/70, pulse 58, temperature 98.2 F (36.8 C), temperature source Oral, resp. rate 16, height 5\' 4"  (1.626 m), weight 232 lb 6.4 oz (105.416 kg), SpO2 96 %. Physical Exam  Constitutional: She is oriented to person, place, and time. She appears well-developed and well-nourished. No distress.  Obese woman, no acute distress  HENT:  Head: Normocephalic and atraumatic.  Eyes: EOM are normal.  Neck: Neck supple.  Cardiovascular: Regular rhythm and intact distal pulses.   No murmur heard. Bradycardic  Pulmonary/Chest: Effort normal. No respiratory distress. She has no wheezes. She has no rales.  Abdominal: Soft. She exhibits no distension. There is no tenderness.  Musculoskeletal: Normal range of motion. She exhibits no edema.  Tender LLE at shin  Neurological: She is alert and oriented to person, place, and time.  Skin: Skin is warm. She is not diaphoretic.  Erythematous area over LLE shin, glossy skin that has warmth compared to right leg, no skin break, purulent discharge, fluctuance, or bleeding.     Psychiatric: She has a normal mood and affect.       Lab results: Basic Metabolic Panel:  Recent Labs  96/04/54 1112  NA 138  K 4.1  CL 105  CO2 27  GLUCOSE 112*  BUN 11  CREATININE 0.67  CALCIUM 9.0   Liver Function Tests: No results for input(s): AST, ALT, ALKPHOS, BILITOT, PROT, ALBUMIN in the last 72 hours. No results for input(s): LIPASE, AMYLASE in the last 72 hours. No results for input(s): AMMONIA in the last 72 hours. CBC: No results for input(s): WBC, NEUTROABS, HGB, HCT, MCV, PLT in the last 72 hours. Cardiac Enzymes: No results for input(s): CKTOTAL, CKMB, CKMBINDEX, TROPONINI  in the last 72 hours. BNP: No results for input(s): PROBNP in the last 72 hours. D-Dimer: No results for input(s): DDIMER in the last 72 hours. CBG: No results for input(s): GLUCAP in the last 72 hours. Hemoglobin A1C: No results for input(s): HGBA1C in the last 72 hours. Fasting Lipid Panel: No results for input(s): CHOL, HDL, LDLCALC, TRIG, CHOLHDL, LDLDIRECT in the last 72 hours. Thyroid Function Tests: No results for input(s): TSH, T4TOTAL, FREET4, T3FREE, THYROIDAB in the last 72 hours. Anemia Panel: No results for input(s): VITAMINB12, FOLATE, FERRITIN, TIBC, IRON, RETICCTPCT in the last 72 hours. Coagulation: No results for input(s): LABPROT, INR in the last 72 hours. Urine Drug Screen: Drugs of Abuse     Component Value Date/Time   LABOPIA NEGATIVE 09/23/2010 0640   COCAINSCRNUR NEGATIVE 09/23/2010 0640   LABBENZ * 09/23/2010 0640    POSITIVE (NOTE) Result repeated and verified. Sent for confirmatory testing   AMPHETMU NEGATIVE 09/23/2010 0640    Alcohol Level: No results for input(s): ETH in the last 72 hours. Urinalysis: No results for input(s): COLORURINE, LABSPEC, PHURINE, GLUCOSEU, HGBUR, BILIRUBINUR, KETONESUR, PROTEINUR, UROBILINOGEN, NITRITE, LEUKOCYTESUR in the last 72 hours.  Invalid input(s): APPERANCEUR   Imaging results:  No results  found.  Other results: EKG: n/a.  Assessment & Plan by Problem: Active Problems:   Cellulitis of left lower extremity   Cellulitis  Cellulitis of LLE: Completed 7 day course of Bactrim, given Ancef once on 6/14 and started on oral Keflex 500 mg QID on 6/14. She was called back to the ED for 1 of 2 positive blood cultures growing coagulase negative Staph species. Patient is afebrile, but reports subjective fevers at home. CBC in process, but had a normal white count 3 days ago. She has erythema overlying her LLE shin that appears improved from borders drawn on prior ED visit. She is tender to touch with slight warmth compared to the right leg. No fluctuance, skin break, purulent discharge, or bleeding seen. Positive blood culture is likely a contaminant and do not think IV antibiotics are indicated at this time. She only took 1 dose of her Keflex so far today. Will continue oral Keflex and admit for observation overnight. -Continue oral Keflex 500 mg every 6 hours -f/u CBC -Toradol 30 mg IV q6h prn breakthrough pain -Percocet 5-325 mg q6h prn severe pain  Health Maintenance: -Check TSH, Hgb A1C, HIV  Diet: Regular  DVT ppx: Lovenox  Code: FULL   Dispo: Disposition is deferred at this time, awaiting improvement of current medical problems. Anticipated discharge in approximately 1 day(s).   The patient does not have a current PCP (No Pcp Per Patient) and does need an [facility] hospital follow-up appointment after discharge.  The patient does not have transportation limitations that hinder transportation to clinic appointments.  Signed: Darreld Mclean, MD 11/16/2015, 4:13 PM

## 2015-11-16 NOTE — Progress Notes (Signed)
Received report from Melissa, RN in ED.

## 2015-11-16 NOTE — ED Notes (Signed)
PT called to ED for positive blood cultures. Pt for admission.

## 2015-11-16 NOTE — Progress Notes (Signed)
Sonya Smith [id number] Admission Data: 11/16/2015 4:04 PM Attending Provider: Earl LagosNischal Narendra, MD  PCP:No PCP Per Patient Consults/ Treatment Team:    Sonya Smith is a 55 y.o. female patient admitted from ED awake, alert  & orientated  X 3,  Full Code, VSS - Blood pressure 138/70, pulse 58, temperature 98.2 F (36.8 C), temperature source Oral, resp. rate 16, height 5\' 4"  (1.626 m), weight 105.416 kg (232 lb 6.4 oz), SpO2 96 %.,IV site WDL:  hand right, condition patent and no redness with a transparent dsg that's clean dry and intact.  Allergies:   Allergies  Allergen Reactions  . Codeine Nausea And Vomiting    "bad thoughts"  . Darvocet [Propoxyphene N-Acetaminophen] Nausea And Vomiting     Past Medical History  Diagnosis Date  . Chest pain     multiple ED visit, no objective evidence of cardiac or pulmonary causes  of chest pain in past  . Arthritis     of cervical and lumbar spine  . Asthma     undiagnosed, was told by a physician in the ED, patient does not take any inhalers at home.     History:  obtained from the patient. Tobacco/alcohol:  Pt orientation to unit, room and routine. Information packet given to patient/family and safety video watched.  Admission INP armband ID verified with patient/family, and in place. SR up x 2, fall risk assessment complete with Patient and family verbalizing understanding of risks associated with falls. Pt verbalizes an understanding of how to use the call bell and to call for help before getting out of bed.  Skin, clean-dry- intact without evidence of bruising, or skin tears.   No evidence of skin break down noted on exam. no ecchymoses, no petechiae, no nodules, no wounds. LLE red and warm. Left arm dry.      Will cont to monitor and assist as needed.  Sonya Smith Sonya Loselaine, RN 11/16/2015 4:04 PM

## 2015-11-16 NOTE — ED Provider Notes (Signed)
CSN: [id number]     Arrival date & time 11/16/15  1012 History   By signing my name below, I, Sonya Smith, attest that this documentation has been prepared under the direction and in the presence of non-physician practitioner, Eyvonne MechanicJeffrey Ashton Sabine, PA-C, . Electronically Signed: Marisue HumbleMichelle Smith, Scribe. 11/16/2015. 10:41 AM.   No chief complaint on file.  The history is provided by the patient. No language interpreter was used.   HPI Comments:  Sonya Smith is a 55 y.o. female who presents to the Emergency Department for wound check of cellulitis to left shin. Pt was seen in the ED 3 days ago for 10 days of leg pain after running into a trailer hitch with her left lower leg. Pt reports associated intermittent nausea and soreness in left thigh. She states the redness and swelling have improved. Pt has iced her leg with mild relief; she is also taking Keflex and Percocet with relief. Denies fever, chills, vomiting, or redness radiating up her leg.   Past Medical History  Diagnosis Date  . Chest pain     multiple ED visit, no objective evidence of cardiac or pulmonary causes  of chest pain in past  . Arthritis     of cervical and lumbar spine  . Asthma     undiagnosed, was told by a physician in the ED, patient does not take any inhalers at home.    Past Surgical History  Procedure Laterality Date  . Peri tonsillar abscess  10/11    drainage  . Drainage of peri tonsillar abscess     Family History  Problem Relation Age of Onset  . Heart disease Mother 4240  . Diabetes Mother   . Heart disease Maternal Aunt 4540   Social History  Substance Use Topics  . Smoking status: Former Smoker    Quit date: 10/09/2003  . Smokeless tobacco: Never Used  . Alcohol Use: No   OB History    No data available     Review of Systems  Constitutional: Negative for fever and chills.  Gastrointestinal: Positive for nausea. Negative for vomiting.  Musculoskeletal: Positive for myalgias.  Skin:  Positive for wound.    Allergies  Codeine and Darvocet  Home Medications   Prior to Admission medications   Medication Sig Start Date End Date Taking? Authorizing Provider  aspirin EC 81 MG tablet Take 81 mg by mouth daily.    Historical Provider, MD  cephALEXin (KEFLEX) 500 MG capsule Take 1 capsule (500 mg total) by mouth 4 (four) times daily. 11/13/15   [name], PA-C  ibuprofen (ADVIL,MOTRIN) 200 MG tablet Take 600 mg by mouth every 6 (six) hours as needed (pain).    Historical Provider, MD  ondansetron (ZOFRAN) 4 MG tablet Take 1 tablet (4 mg total) by mouth every 8 (eight) hours as needed for nausea or vomiting. 11/13/15   Joni ReiningNicole Pisciotta, PA-C  oxyCODONE-acetaminophen (PERCOCET/ROXICET) 5-325 MG tablet 1 to 2 tabs PO q6hrs  PRN for pain 11/13/15   Joni ReiningNicole Pisciotta, PA-C  sulfamethoxazole-trimethoprim (BACTRIM DS,SEPTRA DS) 800-160 MG tablet Take 1 tablet by mouth 2 (two) times daily. Patient not taking: Reported on 11/13/2015 11/04/15   Derwood KaplanAnkit Nanavati, MD   BP 147/100 mmHg  Pulse 76  Temp(Src) 97.6 F (36.4 C)  Resp 18  Wt 110.224 kg  SpO2 99% Physical Exam      ED Course  Procedures  DIAGNOSTIC STUDIES:  Oxygen Saturation is 99% on RA, normal by my interpretation.    COORDINATION  OF CARE:  10:30 AM Continue antibiotic and pain medication prn. Discussed treatment plan with pt at bedside and pt agreed to plan.  Labs Review Labs Reviewed  CBC WITH DIFFERENTIAL/PLATELET  BASIC METABOLIC PANEL    Imaging Review No results found. I have personally reviewed and evaluated these images and lab results as part of my medical decision-making.   EKG Interpretation None      MDM   Final diagnoses:  Cellulitis of left lower extremity   Labs: CBC, BMP  Imaging:  Consults:  Therapeutics:  Discharge Meds:   Assessment/Plan:  55 year old female presents today with cellulitis of the right lower extremity. This seems to be mildly improved compared to her  last visit 72 hours ago. Patient was noted to have positive blood cultures, [name] MD was consulted.  At this time with positive blood cultures, continue source of infection patient will need hospital admission and IV antibiotics.  Patient was initially seen in fast track, patient care will be continued in another area of the hospital as resources or not available in fast track. IV started, labs drawn, patient transferred.        Eyvonne Mechanic, PA-C 11/16/15 1059  [name], MD 11/17/15 [phone]

## 2015-11-17 MED ORDER — CEPHALEXIN 500 MG PO CAPS: 500.0000 mg | ORAL_CAPSULE | Freq: Four times a day (QID) | ORAL | Status: DC

## 2015-11-17 NOTE — Discharge Instructions (Signed)
Please continue your antibiotics as prescribed. I will prescribe additional pills of the Keflex (Cephalexin) to complete a couple extra days of therapy. Your cellulitis has improved well on these oral antibiotics.  The blood culture was likely a contaminant from the normal bacteria that is on our skin. You would be much more sick if you had a true bacterial infection in your blood. We kept you in the hospital overnight to make sure you were doing well.  Please follow up in the Internal Medicine Clinic in the next week. I will send the clinic a message to contact you for an appointment. If you are not contacted in the next few days, please give the clinic a call for a one time hospital follow up.     Cellulitis Cellulitis is an infection of the skin and the tissue under the skin. The infected area is usually red and tender. This happens most often in the arms and lower legs. HOME CARE   Take your antibiotic medicine as told. Finish the medicine even if you start to feel better.  Keep the infected arm or leg raised (elevated).  Put a warm cloth on the area up to 4 times per day.  Only take medicines as told by your doctor.  Keep all doctor visits as told. GET HELP IF:  You see red streaks on the skin coming from the infected area.  Your red area gets bigger or turns a dark color.  Your bone or joint under the infected area is painful after the skin heals.  Your infection comes back in the same area or different area.  You have a puffy (swollen) bump in the infected area.  You have new symptoms.  You have a fever. GET HELP RIGHT AWAY IF:   You feel very sleepy.  You throw up (vomit) or have watery poop (diarrhea).  You feel sick and have muscle aches and pains.   This information is not intended to replace advice given to you by your health care provider. Make sure you discuss any questions you have with your health care provider.   Document Released: 11/04/2007 Document  Revised: 02/06/2015 Document Reviewed: 08/03/2011 Elsevier Interactive Patient Education [employer]2016 Elsevier Inc.

## 2015-11-17 NOTE — Progress Notes (Signed)
Nsg Discharge Note  Admit Date:  11/16/2015 Discharge date: 11/17/2015   Sonya Smith to be D/C'd Home per MD order.  AVS completed.  Copy for chart, and copy for patient signed, and dated. Patient/caregiver able to verbalize understanding.  Discharge Medication:   Medication List    STOP taking these medications        sulfamethoxazole-trimethoprim 800-160 MG tablet  Commonly known as:  BACTRIM DS,SEPTRA DS      TAKE these medications        aspirin EC 81 MG tablet  Take 81 mg by mouth daily.     cephALEXin 500 MG capsule  Commonly known as:  KEFLEX  Take 1 capsule (500 mg total) by mouth 4 (four) times daily. Complete the antibiotics you already have at home first.     ibuprofen 200 MG tablet  Commonly known as:  ADVIL,MOTRIN  Take 600 mg by mouth every 6 (six) hours as needed (pain).     ondansetron 4 MG tablet  Commonly known as:  ZOFRAN  Take 1 tablet (4 mg total) by mouth every 8 (eight) hours as needed for nausea or vomiting.     oxyCODONE-acetaminophen 5-325 MG tablet  Commonly known as:  PERCOCET/ROXICET  1 to 2 tabs PO q6hrs  PRN for pain        Discharge Assessment: Filed Vitals:   11/16/15 2113 11/17/15 0612  BP: 113/56 133/67  Pulse: 56 54  Temp: 98.1 F (36.7 C) 98.2 F (36.8 C)  Resp: 16 16   Skin clean, dry and intact without evidence of skin break down, no evidence of skin tears noted. LLE cellulitis  IV catheter discontinued intact. Site without signs and symptoms of complications - no redness or edema noted at insertion site, patient denies c/o pain - only slight tenderness at site.  Dressing with slight pressure applied.  D/c Instructions-Education: Discharge instructions given to patient/family with verbalized understanding. D/c education completed with patient/family including follow up instructions, medication list, d/c activities limitations if indicated, with other d/c instructions as indicated by MD - patient able to verbalize  understanding, all questions fully answered. Patient instructed to return to ED, call 911, or call MD for any changes in condition.  Patient escorted via WC, and D/C home via private auto.  Sonya Maggard Consuella Loselaine, RN 11/17/2015 12:20 PM

## 2015-11-17 NOTE — Progress Notes (Signed)
   Subjective: Patient feels well this morning. Has some epigastric discomfort, but no chest pain or shortness of breath. She feels her left leg cellulitis is improving. Objective: Vital signs in last 24 hours: Filed Vitals:   11/16/15 1522 11/16/15 1525 11/16/15 2113 11/17/15 0612  BP:  138/70 113/56 133/67  Pulse:  58 56 54  Temp:  98.2 F (36.8 C) 98.1 F (36.7 C) 98.2 F (36.8 C)  TempSrc:  Oral Oral Oral  Resp:  16 16 16   Height: 5\' 4"  (1.626 m)     Weight: 232 lb 6.4 oz (105.416 kg)     SpO2:  96% 95% 98%   Weight change:   Intake/Output Summary (Last 24 hours) at 11/17/15 0958 Last data filed at 11/17/15 0507  Gross per 24 hour  Intake    440 ml  Output      0 ml  Net    440 ml   General: obese woman, resting in bed comfortably, no acute distress Cardiac: slight bradycardia, no rubs, murmurs or gallops Pulm: clear to auscultation bilaterally, moving normal volumes of air Abd: soft obese abdomen, mild epigastric tenderness, , BS present Ext: improved erythema of the LLE from marked border   Assessment/Plan: Active Problems:   Cellulitis of left lower extremity   Cellulitis  Cellulitis of LLE: Completed 7 day course of Bactrim, given Ancef once on 6/14 and started on oral Keflex 500 mg QID on 6/14. She was called back to the ED for 1 of 2 positive blood cultures growing coagulase negative Staph species. Patient is afebrile, without leukocytosis. Positive blood culture is likely a contaminant and do not think IV antibiotics are indicated at this time. Cellulitic appearance is improved compared to yesterday. Will continue oral Keflex to complete a 7 day total course. Her epigastric discomfort seems consistent with bloating/gas.  -Continue oral Keflex 500 mg every 6 hours, will prescribe #12 on discharge -[facility]  Dispo: Anticipated discharge today. [facility]  The patient does have a current PCP (No Pcp Per Patient) and [facility]  [facility].  The patient does not have transportation limitations that hinder transportation to clinic appointments.    LOS: 1 day   Darreld McleanVishal Valery Chance, MD 11/17/2015, 9:58 AM

## 2015-11-17 NOTE — Discharge Summary (Signed)
Name: Sonya Smith MRN: [id number] DOB: 08-13-1960 55 y.o. PCP: No Pcp Per Patient  Date of Admission: 11/16/2015 10:18 AM Date of Discharge: 11/17/2015 Attending Physician: Earl LagosNischal Narendra, MD  Discharge Diagnosis: Cellulitis of left lower extremity Active Problems:   Cellulitis of left lower extremity   Cellulitis  Discharge Medications:   Medication List    STOP taking these medications        sulfamethoxazole-trimethoprim 800-160 MG tablet  Commonly known as:  BACTRIM DS,SEPTRA DS      TAKE these medications        aspirin EC 81 MG tablet  Take 81 mg by mouth daily.     cephALEXin 500 MG capsule  Commonly known as:  KEFLEX  Take 1 capsule (500 mg total) by mouth 4 (four) times daily. Complete the antibiotics you already have at home first.     ibuprofen 200 MG tablet  Commonly known as:  ADVIL,MOTRIN  Take 600 mg by mouth every 6 (six) hours as needed (pain).     ondansetron 4 MG tablet  Commonly known as:  ZOFRAN  Take 1 tablet (4 mg total) by mouth every 8 (eight) hours as needed for nausea or vomiting.     oxyCODONE-acetaminophen 5-325 MG tablet  Commonly known as:  PERCOCET/ROXICET  1 to 2 tabs PO q6hrs  PRN for pain        Disposition and follow-up:   Sonya Smith [facility] Good [facility].  At the hospital follow up visit please address:  1.  Cellulitis: Completion of oral Keflex course and improvement of LLE cellulitis.  2.  Labs / imaging needed at time of follow-up: none  3.  Pending labs/ test needing follow-up: none  Follow-up Appointments: Follow-up Information    Call [facility].   Why:  For an appointment in the next week.   Contact information:   [address] [place] [phone]      Discharge Instructions: Discharge Instructions    Call MD for:  persistant dizziness or light-headedness    Complete by:  As directed      Call  MD for:  persistant nausea and vomiting    Complete by:  As directed      Call MD for:  severe uncontrolled pain    Complete by:  As directed      Call MD for:  temperature >100.4    Complete by:  As directed      Diet - low sodium heart healthy    Complete by:  As directed      Increase activity slowly    Complete by:  As directed            Consultations:    Procedures Performed:  Dg Tibia/fibula Left  11/04/2015  CLINICAL DATA:  Left lower extremity age pain, swelling and redness for 10 days. EXAM: LEFT TIBIA AND FIBULA - 2 VIEW COMPARISON:  None. FINDINGS: The tibia and fibula are intact. Moderate degenerative changes are noted at the knee joint. A moderate sized calcaneal heel spur is noted. IMPRESSION: No significant bony findings. Electronically Signed   By: Rudie MeyerP.  Gallerani M.D.   On: 11/04/2015 19:39    2D Echo: n/a  Cardiac Cath: n/a  Admission HPI: Sonya Smith is a 55 y.o. Female with self-reported history of arthritis, mild asthma, and former tobacco abuse who presented to the ED for reassessment of her left lower  leg swelling.   Roughly 3-4 weeks ago, she hit the left shin on a trailer hitch that developed into a "knot" without skin breakdown. She presented to the ED on 6/5 and was discharged on a 7-day course of Bactrim after having reassuring XR and LE Doppler findings. She completed the course, but returned to the ED on 6/14 with worsened symptoms of erythema, swelling, and pain to her LLE. She was given Ancef once and started on Keflex 500 mg QID at that time as well as a prescription of Percocet for pain relief. Blood cultures were also drawn and resulted in 1 of 2 specimens growing Coagulase negative Staphylococcus species. She was asked to return to the ED today for admission to observation and further evaluation. Patient reports subjective fevers at home and some improvement of her cellulitis. She took one dose of Keflex this morning so far.  She denies chest pain,  shortness of breath, nausea, vomiting. No history of diabetes but she is uninsured and has no primary care physician.  Social Hx: Former 30 pack year smoking history, quit 10 years ago. Occasional alcohol, no illicit drugs.  Family Hx: Mother DM, Sister HTN, Maternal Aunt stroke  Hospital Course by problem list: Active Problems:   Cellulitis of left lower extremity   Cellulitis   Cellulitis of LLE: Completed 7 day course of Bactrim, given Ancef once on 6/14 and started on oral Keflex 500 mg QID on 6/14. She was called back to the ED for 1 of 2 positive blood cultures growing coagulase negative Staph species. Patient was afebrile, without leukocytosis, stable vital signs. Positive blood culture is likely a contaminant and IV antibiotics were not indicated or given. Cellulitic appearance of LLE improved overnight. Continued oral Keflex to complete a 7 day total course. -Continued oral Keflex 500 mg every 6 hours, prescribed #12 on discharge to extend original antibiotic course duration   Discharge Vitals:   BP 133/67 mmHg  Pulse 54  Temp(Src) 98.2 F (36.8 C) (Oral)  Resp 16  Ht  (1.626 m)  Wt 232 lb 6.4 oz (105.416 kg)  BMI 39.87 kg/m2  SpO2 98%  Discharge Labs:  No results found for this or any previous visit (from the past 24 hour(s)).  Signed: Darreld Mclean, MD 11/18/2015, 11:29 AM    Services Ordered on Discharge: none Equipment Ordered on Discharge: none

## 2015-11-18 ENCOUNTER — Telehealth: Payer: Self-pay | Admitting: Internal Medicine

## 2015-11-18 LAB — HEMOGLOBIN A1C
HEMOGLOBIN A1C: 5.5 % (ref 4.8–5.6)
Mean Plasma Glucose: 111 mg/dL

## 2015-11-18 LAB — CULTURE, BLOOD (ROUTINE X 2): Culture: NO GROWTH

## 2015-11-18 NOTE — Telephone Encounter (Signed)
Need TOC discharge dat e 11/17/15 HFU 11/26/15

## 2015-11-19 LAB — HIV ANTIBODY (ROUTINE TESTING W REFLEX): HIV SCREEN 4TH GENERATION: NONREACTIVE

## 2015-11-26 ENCOUNTER — Encounter: Payer: Self-pay | Admitting: Internal Medicine

## 2015-11-26 ENCOUNTER — Ambulatory Visit (INDEPENDENT_AMBULATORY_CARE_PROVIDER_SITE_OTHER): Payer: Self-pay | Admitting: Internal Medicine

## 2015-11-26 VITALS — BP 152/93 | HR 58 | Temp 98.1°F | Ht 64.0 in | Wt 246.2 lb

## 2015-11-26 DIAGNOSIS — L03116 Cellulitis of left lower limb: Secondary | ICD-10-CM

## 2015-11-26 DIAGNOSIS — B9689 Other specified bacterial agents as the cause of diseases classified elsewhere: Secondary | ICD-10-CM

## 2015-11-26 MED ORDER — OXYCODONE-ACETAMINOPHEN 5-325 MG PO TABS: ORAL_TABLET | ORAL | Status: DC

## 2015-11-26 MED ORDER — CIPROFLOXACIN HCL 500 MG PO TABS: 500.0000 mg | ORAL_TABLET | Freq: Two times a day (BID) | ORAL | Status: AC

## 2015-11-26 NOTE — Patient Instructions (Signed)
General Instructions:  I want you to take 7 more days of Cipro 500mg  twice a day.  Please bring your medicines with you each time you come to clinic.  Medicines may include prescription medications, over-the-counter medications, herbal remedies, eye drops, vitamins, or other pills.   Progress Toward Treatment Goals:  No flowsheet data found.  Self Care Goals & Plans:  No flowsheet data found.  No flowsheet data found.   Care Management & Community Referrals:  No flowsheet data found.

## 2015-11-29 NOTE — Assessment & Plan Note (Signed)
A: Cellulitis of left lower extremity  P: It is unclear why her cellulitis has failed to respond to keflex or bactrim.  However it does appears that she has had some improvement in areas after the keflex.  I did consider that this could be venous statis dermatitis however she does not have much edema and it is more unilateral so I feel it is more likely cellulitis.  She has also had 2 negative doppler U/S ruling out DVT.  So I will try a third antibiotic course of Cipro.  If this fails to improve her errythema will we have to revisit the diagnosis of Cellulitis.  I will have her follow up here in 1 week.

## 2015-11-29 NOTE — ED Notes (Signed)
Encounter opened in error.  Sonya GlennAnn Davielle Lingelbach, MD 11/29/15 [phone]

## 2015-11-29 NOTE — Progress Notes (Signed)
Internal Medicine Clinic Attending  I saw and evaluated the patient.  I personally confirmed the key portions of the history and exam documented by Dr. Hoffman and I reviewed pertinent patient test results.  The assessment, diagnosis, and plan were formulated together and I agree with the documentation in the resident's note.      

## 2015-11-29 NOTE — Progress Notes (Signed)
   CC: cellulitis of left leg HPI: [name] is a 55 y.o. who presents for hospital follow up for cellulits of her left leg.  She initially presented to the ED on 6/5 for left leg swelling and was treated with a week of bactrim after a DVT was ruled out.  She returned to the ED on 6/14 without improvement and was discharged with Keflex after a DVT was ruled out, however blood cultures came back positive 1/2 for coag neg staph, before speciation she was called to return to the hospital, she was admitted on 6/17 and monitored overnight with some improvement noted of the cellulitis, she was discharged to continue Keflex.  She now has finished her course however notes increasing pain especially at her ankle and feels she is not getting better.    Past Medical History  Diagnosis Date  . Chest pain     multiple ED visit, no objective evidence of cardiac or pulmonary causes  of chest pain in past  . Arthritis     of cervical and lumbar spine  . Asthma     undiagnosed, was told by a physician in the ED, patient does not take any inhalers at home.    Review of Systems: Review of Systems  Constitutional: Negative for fever and chills.  Cardiovascular: Negative for chest pain.  Musculoskeletal: Negative for falls.  Neurological: Negative for dizziness and headaches.    Physical Exam: Filed Vitals:   11/26/15 1558 11/26/15 1600  BP:  152/93  Pulse:  58  Temp: 98.1 F (36.7 C)   TempSrc: Oral   Height: 5\' 4"  (1.626 m)   Weight: 246 lb 3.2 oz (111.676 kg)   SpO2:  98%   Physical Exam  Constitutional: She is well-developed, well-nourished, and in no distress.  Cardiovascular: Normal rate and regular rhythm.   Pulses:      Dorsalis pedis pulses are 1+ on the right side, and 1+ on the left side.       Posterior tibial pulses are 1+ on the right side, and 1+ on the left side.  Pulmonary/Chest: Effort normal and breath sounds normal.  Abdominal: Soft. Bowel sounds are normal.    Musculoskeletal: She exhibits edema (trace bilaterally).  Skin:  Compared with previous photo she still has erythema  over her left lower leg, the lateral aspect is improved however she appears to have worsened in the medial aspect and around the ankle. Her left leg is hairless with a shine to it. Her right leg has an area of redness/irratiation that she reports is due to shaving  Nursing note and vitals reviewed.    Assessment & Plan:  See encounters tab for problem based medical decision making. Patient seen with Dr. Rogelia BogaButcher

## 2015-12-05 NOTE — Telephone Encounter (Signed)
Cannot reach by phone, she did come to [facility] but did not schedule her next appt

## 2015-12-06 ENCOUNTER — Ambulatory Visit (INDEPENDENT_AMBULATORY_CARE_PROVIDER_SITE_OTHER): Payer: Self-pay | Admitting: Internal Medicine

## 2015-12-06 ENCOUNTER — Encounter: Payer: Self-pay | Admitting: Internal Medicine

## 2015-12-06 VITALS — BP 131/68 | HR 66 | Temp 98.1°F | Ht 64.0 in | Wt 245.3 lb

## 2015-12-06 DIAGNOSIS — L738 Other specified follicular disorders: Secondary | ICD-10-CM

## 2015-12-06 DIAGNOSIS — B9689 Other specified bacterial agents as the cause of diseases classified elsewhere: Secondary | ICD-10-CM

## 2015-12-06 DIAGNOSIS — L03116 Cellulitis of left lower limb: Secondary | ICD-10-CM

## 2015-12-06 MED ORDER — OXYCODONE-ACETAMINOPHEN 5-325 MG PO TABS: ORAL_TABLET | ORAL | Status: DC

## 2015-12-06 MED ORDER — DOXYCYCLINE HYCLATE 100 MG PO TABS: 100.0000 mg | ORAL_TABLET | Freq: Two times a day (BID) | ORAL | Status: DC

## 2015-12-06 MED FILL — DOXYCYCLINE HYCLATE 100 MG: 100 | 14 days supply | Qty: 28 | Fill #0

## 2015-12-06 NOTE — Assessment & Plan Note (Signed)
DDx is still highest for cellulitis. She is low risk for DVT and has been r/o x2 previously. Her unilateral presentation and acute onset do not support venous stasis ulcers. Her findings on exam are most consistent with cellulitis rather traumatic injury with musculoskeletal pathology. Given the failed therapy x 3, we attempted to I&D the new follicular lesion on the dorsum of the left ankle today. We did not observe purulent release after incision, but sent a wound sample for culture. We still feel that cellulitis is the best dx for her clinical hx and exam. For this reason we will attempt a 4th round of abx with doxycycline 100mg  BID for 14 days while awaiting culture results. If this is unsuccessful I would recommend considering referral to orthopedics for evaluation. She will f/u PRN if no resolution with this final course of abx.

## 2015-12-06 NOTE — Progress Notes (Signed)
Internal Medicine Clinic Attending  I saw and evaluated the patient.  I personally confirmed the key portions of the history and exam documented by Dr. Marinda ElkStrelow and I reviewed pertinent patient test results.  The assessment, diagnosis, and plan were formulated together and I agree with the documentation in the resident's note.  I also supervised the I&D.

## 2015-12-06 NOTE — Progress Notes (Signed)
CC: f/u left leg pain and swelling HPI: [name] is a 55 y.o. female with a h/o of cellulitis of the left lower extremity who presents for f/u of leg swelling and pain. She initially presented to the ED on 6/5 and was treated with bactrim, then returned on 6/14 and was treated with keflex after her blood cultures grew coag neg staph 1 of 2. On f/u last week, her sx had not resolved and she was given a third course of cipro x 7d which she completed 3 d ago. DVT was r/o twice and she has low pretest risk. Her sx are unilateral and do not appear consistent with venous stasis as she reports acute onset 6 weeks ago following local trauma to the left calf (she reports hitting her leg on the edge of a trailer).  Today she reports that her symptoms have worsened in spite of abx tx. She notes a new lesion on the dorsum of her ankle that is very tender and raised. She denies any fevers or systemic symptoms and notes that the dermatitis has not progressed up her leg above her knee. She endorses pain with ambulation, but does not note relieve with elevation. She has been icing the leg and reports a reduction in swelling with this treatment.  She denies SOB and CP. Contralateral leg has not sx.  Past Medical History  Diagnosis Date  . Chest pain     multiple ED visit, no objective evidence of cardiac or pulmonary causes  of chest pain in past  . Arthritis     of cervical and lumbar spine  . Asthma     undiagnosed, was told by a physician in the ED, patient does not take any inhalers at home.    Current Outpatient Rx  Name  Route  Sig  Dispense  Refill  . aspirin EC 81 MG tablet   Oral   Take 81 mg by mouth daily.         Marland Kitchen. ibuprofen (ADVIL,MOTRIN) 200 MG tablet   Oral   Take 600 mg by mouth every 6 (six) hours as needed (pain).         . ondansetron (ZOFRAN) 4 MG tablet   Oral   Take 1 tablet (4 mg total) by mouth every 8 (eight) hours as needed for nausea or vomiting.   10  tablet   0   . oxyCODONE-acetaminophen (PERCOCET/ROXICET) 5-325 MG tablet      1 to 2 tabs PO q6hrs  PRN for pain   15 tablet   0    Review of Systems: A complete ROS was negative except as per HPI.  Physical Exam: Filed Vitals:   12/06/15 1357  BP: 131/68  Pulse: 66  Temp: 98.1 F (36.7 C)  TempSrc: Oral  Height: 5\' 4"  (1.626 m)  Weight: 245 lb 4.8 oz (111.267 kg)  SpO2: 99%   General appearance: alert, cooperative, appears stated age and mild distress Lungs: clear to auscultation bilaterally Heart: regular rate and rhythm, S1, S2 normal, no murmur, click, rub or gallop Extremities: edema trace bilaterally, chronic venous stasis changes bilateral to the ankle and cellulitic changes to left leg from ankle to knee Skin: Skin color, texture, turgor normal. No rashes or lesions or left lower leg TTP anteriorly with shiny appearance over the skin, warm, erythematous with focal lesion of follicular lesion raised over the dorsum of the ankle  Assessment & Plan:  See encounters tab for problem based medical  decision making. Patient seen with Dr. Cleda DaubE. Hoffman  Signed: Carolynn CommentBryan Geraline Halberstadt, MD 12/06/2015, 2:01 PM  Pager: [phone]

## 2015-12-06 NOTE — Patient Instructions (Signed)
We have collected a sample from your leg to help target our medication to your infection. We will start a new antibiotic, Doxycycline 2 times every day for 14 days. Please take this medication with a large glass of water to avoid irritation to your esophagus, and be sure to take this medication with some food.  You should also avoid exposure to sunlight without appropriate skin protection as this medication can make you more sensitive to sunburn.  We will call you if there are any changes to your current medication regimen after we receive results from the culture.  After you finish your antibiotic, please call us to make another appointment if your symptoms have not resolved. If your symptoms worsen, you develop pain or redness that moves further up your leg, or you develop fevers, please call immediately.  If you symptoms do not resolve, we may have to consider making a referral to a specialist for further evaluation.

## 2015-12-06 NOTE — Telephone Encounter (Signed)
Transition Care Management Follow-up Telephone Call   Date discharged? 11/17/2015. Came to follow up appointment   How have you been since you were released from the hospital? My leg still hurts   Do you understand why you were in the hospital? yes   Do you understand the discharge instructions? yes   Where were you discharged to? home   Items Reviewed:  Medications reviewed: yes  Allergies reviewed: yes  Dietary changes reviewed: no  Referrals reviewed: no   Functional Questionnaire:   Activities of Daily Living (ADLs):   She states they are independent in the following: ambulation, bathing and hygiene, feeding, continence, grooming, toileting and dressing States they require assistance with the following: none   Any transportation issues/concerns?: no   Any patient concerns? Yes. Area on ankle is still causing "excruciating pain" especially when walking. Patient has tried to stay off of it but with no improvement. Completed ABX from follow-up visit. [facility]. Patient states that she did not make a follow up appointment because she did not have the $25 co-pay. Told patient that we could bill her the $25.  Confirmed importance and date/time of follow-up visits scheduled yes  Provider Appointment booked with  Confirmed with patient if condition begins to worsen call PCP or go to the ER.  Patient was given the office number and encouraged to call back with question or concerns.  : yes

## 2015-12-06 NOTE — Progress Notes (Addendum)
Incision and Drainage Procedure Note  Pre-operative Diagnosis: Cellulitis with follicular abcess  Post-operative Diagnosis: same  Indications: failure of empiric abx w/ need for culture data  Anesthesia: 2% plain lidocaine  Procedure Details  The procedure, risks and complications have been discussed in detail (including, but not limited to airway compromise, infection, bleeding) with the patient, and the patient has signed consent to the procedure.  The skin was sterilely prepped with iodine and the affected area was prepared in the usual fashion. After adequate local anesthesia, I&D with a #11 blade was performed on the left anterior pre-tibial abcess lying just superior to the ankle. Purulent drainage: absent The wound was swapped and sent for culture. The patient was observed until stable. Sterile dressings were applied.  Findings: Non-draining follicular lesion  EBL: <1 cc's  Drains: N/A  Condition: Tolerated procedure well   Complications: none.   Procedure was preformed by Dr. Carolynn CommentBryan Marcoantonio Legault, MD under the supervision of Dr. Carlynn PurlErik Hoffman, DO.

## 2015-12-09 LAB — WOUND CULTURE: Organism ID, Bacteria: NONE SEEN

## 2015-12-19 ENCOUNTER — Telehealth: Payer: Self-pay | Admitting: Internal Medicine

## 2015-12-19 DIAGNOSIS — L03116 Cellulitis of left lower limb: Secondary | ICD-10-CM

## 2015-12-19 NOTE — Telephone Encounter (Signed)
  Reason for call:   I placed an outgoing call to Ms. Bettye BoeckAvery C Trowbridge at 1:20 PM regarding the status of her left lower extremity cellulitis .   Assessment/ Plan:   Mrs. Chilton SiGreen answered the phone. She reports that her left lower leg redness and swelling has been slowly improving. She still endorses some mild tenderness with walking and with movement of her left ankle. She also complains of some upset stomach and diarrhea. She has been taking her doxycycline antibiotic regularly and has one remaining day.  I advised that her GI symptoms are likely due to her antibiotic use and should resolve with the completion of her antibiotic course tomorrow.  I also advised that it is encouraging that her symptoms have improved in her left lower leg. I suggested that she continue to order her symptoms over the next several weeks and we will plan to follow-up in one month.  I advised that if her symptoms worsen during that time she should call us back and we will reevaluate.  Given that she is still endorsing some swelling and pain in her bilateral lower extremities I am concerned that there may be some component of venous stasis swelling. We will plan to address this issue if still present at her next visit.  As always, pt is advised that if symptoms worsen or new symptoms arise, they should go to an urgent care facility or to to ER for further evaluation.   Carolynn CommentBryan Truett Mcfarlan, MD   12/19/2015, 1:24 PM

## 2015-12-23 ENCOUNTER — Ambulatory Visit (INDEPENDENT_AMBULATORY_CARE_PROVIDER_SITE_OTHER): Payer: Self-pay | Admitting: Internal Medicine

## 2015-12-23 ENCOUNTER — Encounter: Payer: Self-pay | Admitting: Internal Medicine

## 2015-12-23 ENCOUNTER — Ambulatory Visit: Payer: Self-pay

## 2015-12-23 VITALS — BP 148/100 | HR 59 | Temp 98.1°F | Wt 245.5 lb

## 2015-12-23 DIAGNOSIS — M79605 Pain in left leg: Secondary | ICD-10-CM

## 2015-12-23 DIAGNOSIS — M79662 Pain in left lower leg: Secondary | ICD-10-CM | POA: Insufficient documentation

## 2015-12-23 DIAGNOSIS — M7989 Other specified soft tissue disorders: Secondary | ICD-10-CM

## 2015-12-23 DIAGNOSIS — L03116 Cellulitis of left lower limb: Secondary | ICD-10-CM

## 2015-12-23 MED ORDER — OXYCODONE-ACETAMINOPHEN 5-325 MG PO TABS: ORAL_TABLET | ORAL | 0 refills | Status: DC

## 2015-12-23 NOTE — Progress Notes (Signed)
Internal Medicine Clinic Attending  I saw and evaluated the patient.  I personally confirmed the key portions of the history and exam documented by Dr. Marinda Elk and I reviewed pertinent patient test results.  The assessment, diagnosis, and plan were formulated together and I agree with the documentation in the resident's note. I first saw Sonya Smith in June. There has been no sig change in appearance of her LLE. There are some skin changes (shawl sign) that might suggest dermatomyositis so Dr Marinda Elk is W/U with labs. She c/o R groin pain only with palpation. I could feel no LAD. The L thigh might be a bit larger than the R. Due to brawny appearance of LLE would be concerned about lymph and venous drainage of L leg proximally so would CT area if no other abnl on labs to suggest alternate etiology. If that doesn't provide dx, might go for skin or muscle bx.

## 2015-12-23 NOTE — Patient Instructions (Signed)
In order to showing the cause of her symptoms, we will order several blood tests today. I will update you with the results of these tests when they're available. If these tests do not give [place] the answers were looking for, we will consider evaluating your left leg with CT scan. Plan follow-up with me in 2 weeks to evaluate how your symptoms are doing.  If your symptoms worsen or there are to your management the need to be made following the results of your testing I will give you a call.

## 2015-12-23 NOTE — Progress Notes (Signed)
CC: f/u left leg pain and swelling HPI: [name] is a 55 y.o. female with a h/o of cellulitis of the left lower extremity who presents for f/u of leg swelling and pain. She initially presented to the ED on 6/5 and was treated with bactrim, then returned on 6/14 and was treated with keflex after her blood cultures grew coag neg staph 1 of 2. On f/u last week, her sx had not resolved and she was given a third course of cipro x 7d which she completed 3 d ago. DVT was r/o twice and she has low pretest risk. 2 weeks ago, patient underwent I&D, which demonstrated no purulent drainage, culture demonstrated no growth. She was treated empirically with a 14 day course of doxycycline for coverage of MRSA, during which a course she noted interval improvement.  She denies SOB and CP. Contralateral leg has not sx.  Please see the details in the Assessment and Plan for the status of the patients chronic conditions.  Past Medical History:  Diagnosis Date  . Arthritis    of cervical and lumbar spine  . Asthma    undiagnosed, was told by a physician in the ED, patient does not take any inhalers at home.   . Chest pain    multiple ED visit, no objective evidence of cardiac or pulmonary causes  of chest pain in past    Current Outpatient Prescriptions:  .  aspirin EC 81 MG tablet, Take 81 mg by mouth daily., Disp: , Rfl:  .  ibuprofen (ADVIL,MOTRIN) 200 MG tablet, Take 600 mg by mouth every 6 (six) hours as needed (pain)., Disp: , Rfl:  .  ondansetron (ZOFRAN) 4 MG tablet, Take 1 tablet (4 mg total) by mouth every 8 (eight) hours as needed for nausea or vomiting., Disp: 10 tablet, Rfl: 0 .  oxyCODONE-acetaminophen (PERCOCET/ROXICET) 5-325 MG tablet, 1 to 2 tabs PO q6hrs  PRN for pain, Disp: 15 tablet, Rfl: 0  Review of Systems: ROS as per HPI.  Physical Exam: Vitals:   12/23/15 1046  BP: (!) 148/100  Pulse: (!) 59  Temp: 98.1 F (36.7 C)  TempSrc: Oral  SpO2: 99%  Weight: 245 lb 8 oz  (111.4 kg)   General appearance: alert, cooperative, appears stated age and mild distress Lungs: clear to auscultation bilaterally Heart: regular rate and rhythm, S1, S2 normal, no murmur, click, rub or gallop Extremities: edema trace bilaterally, chronic venous stasis changes bilateral to the ankle and diffuse erythema of the left LE w/ woody texture and tender subcutaneous nodules to the medial and lateral calf, well healing incision from I&D Skin: macular rash with erythema diffusely over the proximal UE and trunk, with shawl distribution of erythematous plaque prominetly in the center chest  Assessment & Plan:  See encounters tab for problem based medical decision making. Patient seen with Dr. Kieth Brightly was seen today for leg pain.  Diagnoses and all orders for this visit:  Pain and swelling of left lower leg -     CMP14 + Anion Gap -     Sed Rate (ESR) -     CRP (C-Reactive Protein) -     Rheumatoid (RA) Factor -     CK, total -     Myositis Panel III  Cellulitis of left lower extremity  Other orders -     oxyCODONE-acetaminophen (PERCOCET/ROXICET) 5-325 MG tablet; 1 to 2 tabs PO q6hrs  PRN for pain    Signed: Holley Raring,  MD 12/23/2015, 11:47 AM  Pager: [phone]

## 2015-12-23 NOTE — Assessment & Plan Note (Signed)
Patient has completed her 14 day course of doxycycline. She notes small interval improvement in her symptoms, but denies significant resolution. She still complains of left lower extremity pain and swelling. This pain is significantly worse with walking. After multiple courses of antibiotics I am convinced that this is noninfectious. Please see assessment and plan under pain and swelling of left lower leg for further evaluation and management.

## 2015-12-23 NOTE — Assessment & Plan Note (Signed)
Patient reports several month history of left lower extremity pain and swelling, she has been ruled out for DVT with lower extremity Dopplers on several occasions, she has also been treated with multiple courses of antibiotics for presumed cellulitis. These interventions have made no improvement in her overall symptoms. Her presentation today she has a mildly tender left lower extremity with a firm "woody" edematous appearance. This leg has erythematous changes throughout as compared with contralateral. She also has several firm subcutaneous nodules which are significantly tender to palpation. Patient complains that her symptoms are worse with activity relieved with rest.  Patient also complains of some generalized weakness when getting up from seated, she has been using a cane recently for this. She also notes some generalized weakness with raising arms above the head. These symptoms have been going on for some time she is not sure of the initial start. Patient also endorses a rash today. She notes that this rash is most prominent on her right and left upper extremities around the shoulder. These macular lesions are itchy and upon further examination demonstrates a diffuse rash along the trunk, and a shawl sign of erythematous skin in the center chest.  She also endorses some chronic pain with movement of her fingers in the small joints of her hands bilaterally this pain sometimes radiates through her upper extremities. Has a family history of arthritis.  Given all these new symptoms and lack of findings on previous evaluations, we will workup for inflammatory condition such as arthritis and myositis. We will order ESR, CRP, RF, CK, CMP, and autoantibodies. If this evaluation is nonrevealing, we will consider CT scan to evaluate for lymphedema. And may consider other imaging for evaluation of soft tissue damage.

## 2015-12-24 LAB — CMP14 + ANION GAP
A/G RATIO: 1.7 (ref 1.2–2.2)
ALBUMIN: 4.3 g/dL (ref 3.5–5.5)
ALT: 21 IU/L (ref 0–32)
AST: 21 IU/L (ref 0–40)
Alkaline Phosphatase: 60 IU/L (ref 39–117)
Anion Gap: 18 mmol/L (ref 10.0–18.0)
BILIRUBIN TOTAL: 0.3 mg/dL (ref 0.0–1.2)
BUN / CREAT RATIO: 20 (ref 9–23)
BUN: 12 mg/dL (ref 6–24)
CALCIUM: 9.1 mg/dL (ref 8.7–10.2)
CO2: 22 mmol/L (ref 18–29)
Chloride: 104 mmol/L (ref 96–106)
Creatinine, Ser: 0.61 mg/dL (ref 0.57–1.00)
GFR, EST AFRICAN AMERICAN: 121 mL/min/{1.73_m2} (ref >59)
GFR, EST NON AFRICAN AMERICAN: 105 mL/min/{1.73_m2} (ref >59)
GLOBULIN, TOTAL: 2.6 g/dL (ref 1.5–4.5)
Glucose: 99 mg/dL (ref 65–99)
POTASSIUM: 4.9 mmol/L (ref 3.5–5.2)
SODIUM: 144 mmol/L (ref 134–144)
TOTAL PROTEIN: 6.9 g/dL (ref 6.0–8.5)

## 2015-12-24 LAB — SEDIMENTATION RATE: Sed Rate: 9 mm/hr (ref 0–40)

## 2015-12-24 LAB — RHEUMATOID FACTOR: RHEUMATOID FACTOR: 10.1 [IU]/mL (ref 0.0–13.9)

## 2015-12-24 LAB — CK: Total CK: 94 U/L (ref 24–173)

## 2015-12-24 LAB — C-REACTIVE PROTEIN: CRP: 7.8 mg/L — ABNORMAL HIGH (ref 0.0–4.9)

## 2015-12-25 ENCOUNTER — Ambulatory Visit: Payer: Self-pay

## 2016-01-01 LAB — MYOSITIS PANEL III
EJ*: NEGATIVE
JO-1 (WB): NEGATIVE
Ku*: NEGATIVE
MI-2 ANTIBODIES: NEGATIVE
OJ*: NEGATIVE
PL-12: NEGATIVE
PL-7: NEGATIVE
PM-SCL 75: NEGATIVE
PM-Scl 100*: NEGATIVE
RNP: 40.7 U/mL — AB
RO-52*: NEGATIVE
SIGNAL RECOGNITION PARTICLE: NEGATIVE

## 2016-01-07 ENCOUNTER — Encounter (INDEPENDENT_AMBULATORY_CARE_PROVIDER_SITE_OTHER): Payer: Self-pay

## 2016-01-07 ENCOUNTER — Encounter: Payer: Self-pay | Admitting: Internal Medicine

## 2016-01-07 ENCOUNTER — Ambulatory Visit (INDEPENDENT_AMBULATORY_CARE_PROVIDER_SITE_OTHER): Payer: Self-pay | Admitting: Internal Medicine

## 2016-01-07 VITALS — BP 137/67 | HR 58 | Temp 97.9°F | Ht 64.0 in | Wt 244.3 lb

## 2016-01-07 DIAGNOSIS — M351 Other overlap syndromes: Secondary | ICD-10-CM | POA: Insufficient documentation

## 2016-01-07 DIAGNOSIS — R21 Rash and other nonspecific skin eruption: Secondary | ICD-10-CM

## 2016-01-07 DIAGNOSIS — D8989 Other specified disorders involving the immune mechanism, not elsewhere classified: Secondary | ICD-10-CM

## 2016-01-07 DIAGNOSIS — M359 Systemic involvement of connective tissue, unspecified: Secondary | ICD-10-CM

## 2016-01-07 MED ORDER — NAPROXEN 500 MG PO TABS: 500.0000 mg | ORAL_TABLET | Freq: Two times a day (BID) | ORAL | 0 refills | Status: DC

## 2016-01-07 MED ORDER — DIPHENHYDRAMINE HCL 25 MG PO TABS: 25.0000 mg | ORAL_TABLET | Freq: Four times a day (QID) | ORAL | 0 refills | Status: DC | PRN

## 2016-01-07 MED ORDER — OXYCODONE-ACETAMINOPHEN 5-325 MG PO TABS: ORAL_TABLET | ORAL | 0 refills | Status: DC

## 2016-01-07 MED ORDER — HYDROCORTISONE 2.5 % EX CREA: TOPICAL_CREAM | Freq: Two times a day (BID) | CUTANEOUS | 0 refills | Status: DC

## 2016-01-07 MED ORDER — NYSTATIN 100000 UNIT/GM EX CREA: 1.0000 "application " | TOPICAL_CREAM | Freq: Two times a day (BID) | CUTANEOUS | 0 refills | Status: DC

## 2016-01-07 MED FILL — NYSTATIN 100,000 UNIT/GM CR: 100000 | 10 days supply | Qty: 30 | Fill #0

## 2016-01-07 MED FILL — OXYCODONE/APAP 5-325: 5-325 | 4 days supply | Qty: 30 | Fill #0

## 2016-01-07 MED FILL — HYDROCORTISONE 2.5% CREAM: 2.5 | 10 days supply | Qty: 30 | Fill #0

## 2016-01-07 NOTE — Patient Instructions (Signed)
FOR YOUR RASH, USE THE NYSTATIN CREAM UNDER YOUR BREASTS. FOR THE OTHER AREAS OF RASH, USE HYDROCORTISONE CREAM.  FOR PAIN, USE OXYCODONE AND NAPROXEN.  FOR ITCHING, USE THE HYDROCORTISONE CREAM AND BENADRYL AS NEEDED. BENADRYL CAN MAKE YOU FATIGUED.   FOLLOW UP IN 2 WEEKS.   WE WILL ALSO CHECK LAB WORK TODAY AND REFER YOU TO RHEUMATOLOGY.

## 2016-01-07 NOTE — Progress Notes (Signed)
    CC: rash  HPI: [name] is a 55 y.o. female with PMHx of anxiety who presents to the clinic for complaint of rash and lower extremity pain.  In June 2017, patient was admitted to the hospital for treatment of left lower extremity cellulitis after an injury to her left leg. Patient was treated with IV antibiotics and followed as outpatient. She also completed an outpatient course of Keflex and Doxycycline. During follow up, patient described worsening symptoms of rash, joint pain, and lower extremity pain which are all new within the last 3 months.  Patient complains of a pruritic diffuse erythematous rash on her face, chest, back, upper extremities, and upper abdomen.  Patient complains of joint pains in her MCP and PIP joints bilaterally, knees, and left ankle.  She also has new trace pitting edema in her lower extremities bilaterally.  Given concern for an autoimmune process such as dermatomyositis, patient underwent lab work up with CMET, ESR, CRP, CK, RF, TSH, RA and myositis panel. The results were only significant for a mildly elevated CRP at 7.8 and positive RFP at 40.7.   Past Medical History:  Diagnosis Date  . Arthritis    of cervical and lumbar spine  . Asthma    undiagnosed, was told by a physician in the ED, patient does not take any inhalers at home.   . Chest pain    multiple ED visit, no objective evidence of cardiac or pulmonary causes  of chest pain in past    Review of Systems: A complete ROS was negative except as noted in HPI.   Physical Exam: Vitals:   01/07/16 0951  BP: 137/67  Pulse: (!) 58  Temp: 97.9 F (36.6 C)  TempSrc: Oral  SpO2: 98%  Weight: 244 lb 4.8 oz (110.8 kg)  Height: '5\' 4"'$  (1.626 m)   General: Vital signs reviewed.  Patient is well-developed and well-nourished, in no acute distress and cooperative with exam.  Head: +Malar rash Eyes: PERRLA, conjunctivae normal. Neck: Supple, trachea midline.  Cardiovascular: RRR, S1 normal,  S2 normal, no murmurs, gallops, or rubs. Pulmonary/Chest: Clear to auscultation bilaterally, no wheezes, rales, or rhonchi. Abdominal: Soft, non-tender, non-distended, BS +.  Musculoskeletal: No evidence of Gottron's papules.  Extremities: 1+ pitting lower extremity edema bilaterally, pulses symmetric and intact bilaterally.  Skin: Erythematous, annular plaques most notable beneath bilateral breasts and upper abdomen. Diffuse violaceous and erythematous scaly and papular rash along chest, back, upper extremities and upper abdomen.  Psychiatric: Normal mood and affect. speech and behavior is normal. Cognition and memory are normal.   Assessment & Plan:  See encounters tab for problem based medical decision making. Patient seen with Dr. Daryll Drown.

## 2016-01-07 NOTE — Assessment & Plan Note (Addendum)
In June 2017, patient was admitted to the hospital for treatment of left lower extremity cellulitis after an injury to her left leg. Patient was treated with IV antibiotics and followed as outpatient. She also completed an outpatient course of Keflex and Doxycycline. During follow up, patient described worsening symptoms of rash, joint pain, and lower extremity pain which are all new within the last 3 months.  Patient complains of a pruritic diffuse erythematous rash on her face, chest, back, upper extremities, and upper abdomen.  Patient complains of joint pains in her MCP and PIP joints bilaterally, knees, and left ankle.  She also has new trace pitting edema in her lower extremities bilaterally.  Given concern for an autoimmune process such as dermatomyositis, patient underwent lab work up with CMET, ESR, CRP, CK, RF, TSH, RA and myositis panel. The results were only significant for a mildly elevated CRP at 7.8 and positive RFP at 40.7. Physical exam reveals a malar rash, possible heliotrope rash and erythematous, annular plaques most notable beneath bilateral breasts and upper abdomen. Diffuse violaceous and erythematous scaly and papular rash along chest, back, upper extremities and upper abdomen.   Assessment: Autoimmune Disease. 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  Plan: -Check ANA, anti-dsDNA, and anti-Smith for SLE, if negative, would consider MCTD as the diagnosis -For pruritic rash, will try hydrocortisone cream and benadryl -For possible candidal intertrigo, will try nystatin cream- if no improvement, switch to hydrocortisone creams as may be consistent with annular plaques -Continue oxycodone for pain -Return in 2 weeks -Referral to Rheumatology  Addendum: ANA pending, anti-dsDNA negative, and anti-Smith negative would consider MCTD as the leading diagnosis

## 2016-01-08 ENCOUNTER — Telehealth: Payer: Self-pay | Admitting: *Deleted

## 2016-01-08 LAB — ANTI-SMITH ANTIBODY

## 2016-01-08 LAB — ANTI-DNA ANTIBODY, DOUBLE-STRANDED: dsDNA Ab: <1 IU/mL (ref 0–9)

## 2016-01-08 NOTE — Telephone Encounter (Signed)
Message from patient that she got her meds yesterday  and an extra med-Benadryl was in her bag.  Medication not on her med list.  RTC to patient message left with female who answered phone that the Clinics had returned her call.  Spoke with Pixie CasinoJ. Kim Pharmacist about extra med.  Will try and reach patient as well.  Angelina OkGladys Rigel Filsinger, RN 12/08/2015 4:31 PM

## 2016-01-09 NOTE — Progress Notes (Signed)
Internal Medicine Clinic Attending  I saw and evaluated the patient.  I personally confirmed the key portions of the history and exam documented by Dr. Burns and I reviewed pertinent patient test results.  The assessment, diagnosis, and plan were formulated together and I agree with the documentation in the resident's note. 

## 2016-01-13 LAB — ANTINUCLEAR ANTIBODIES, IFA: ANA Titer 1: NEGATIVE

## 2016-01-29 ENCOUNTER — Telehealth: Payer: Self-pay | Admitting: Internal Medicine

## 2016-01-29 NOTE — Telephone Encounter (Signed)
AP. REMINDER  CALL, LMTCB °

## 2016-01-30 ENCOUNTER — Ambulatory Visit (INDEPENDENT_AMBULATORY_CARE_PROVIDER_SITE_OTHER): Payer: Self-pay | Admitting: Internal Medicine

## 2016-01-30 ENCOUNTER — Other Ambulatory Visit (HOSPITAL_COMMUNITY)
Admission: RE | Admit: 2016-01-30 | Discharge: 2016-01-30 | Disposition: A | Discharge status: Home / Self Care | Payer: Self-pay | Source: Ambulatory Visit | Attending: Internal Medicine | Admitting: Internal Medicine

## 2016-01-30 ENCOUNTER — Encounter: Payer: Self-pay | Admitting: Internal Medicine

## 2016-01-30 VITALS — BP 151/80 | HR 64 | Temp 98.0°F | Wt 249.0 lb

## 2016-01-30 DIAGNOSIS — D8989 Other specified disorders involving the immune mechanism, not elsewhere classified: Secondary | ICD-10-CM

## 2016-01-30 DIAGNOSIS — M351 Other overlap syndromes: Secondary | ICD-10-CM

## 2016-01-30 MED ORDER — MELOXICAM 7.5 MG PO TABS: 7.5000 mg | ORAL_TABLET | Freq: Two times a day (BID) | ORAL | 1 refills | Status: DC

## 2016-01-30 MED ORDER — OXYCODONE-ACETAMINOPHEN 5-325 MG PO TABS: ORAL_TABLET | ORAL | 0 refills | Status: DC

## 2016-01-30 MED FILL — MELOXICAM 7.5 MG TABLET: 7.5 | 15 days supply | Qty: 30 | Fill #0

## 2016-01-30 MED FILL — OXYCODONE/APAP 5-325: 5-325 | 4 days supply | Qty: 30 | Fill #0

## 2016-01-30 NOTE — Assessment & Plan Note (Addendum)
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 She has not had any other positive antibodies including ANA, anti-ro/la, anti-smooth muscle, RF. Patient did have a mildly elevated CRP. Today she continues to complain of diffuse rash over the upper extremities most prominent in a shawl distribution over her chest and shoulders. She has been using hydrocortisone cream for this with some success. She also complains of annular rash in the intertriginous folds under her breast which she has been treated with nystatin cream.  Patient notes that she has a strong family history of autoimmune conditions, which is most prominent in her uncle's family. But she does note that her brother has Behcet's disease. At this time she also endorses some diffuse arthritic pains in her knees and elbows and hands. She notes some mild symptoms of Raynaud's phenomenon in her hands and feet.  Patient's primary complaint is the pain that she experiences in her bilateral lower extremity which is associated with nodular and woody swelling that waxes and wanes. She also has erythematous rash which is tender to palpation and fluctuates in its location and intensity. This rash is very similar in appearance to that of venous stasis rashes. Patient has been treating this pain with ibuprofen 400 mg every 6 hours and oxycodone 5 mg as needed. Patient denies any weakness in the lower extremities, but does walk with a cane due to significant pain.  We'll plan to send a punch biopsy of one of the skin lesions of the lower extremity to surgical path for analysis. Our concern is to differentiate suspected venous stasis from rash associated with mixed connective tissue disease. We will also plan to alter her NSAID regimen from ibuprofen 400 mg to meloxicam 7.5 mg daily, however it was noted that if the patient was not able to perform meloxicam she could try 600 mg ibuprofen every 6. I will  also send an anti-CCP antibody today in order to rule out possibility of rheumatoid arthritis.  We will plan to follow-up on findings from surgical path and if indicated may start DMARD therapy at that time with Plaquenil. Short course of steroids was considered, however patient notes that her symptoms have been mildly improved since last visit with Dr. Lawerance BachBurns several weeks ago. We will defer steroid treatment at this time.  We will plan to follow-up with the patient in 5 weeks and I will contact her with the results of her surgical pathology as soon as they're available.

## 2016-01-30 NOTE — Progress Notes (Signed)
Punch Biopsy Procedure Note  Pre-operative Diagnosis: Erythematous plaque  Locations:anterior, right lower extremity  Anesthesia: Lidocaine 1% with epinephrine   Procedure Details  History of allergy to lidocaine: no Sensitivity to epinephrine: no  Patient informed of the risks (including bleeding and infection) and benefits of the  procedure and verbal informed consent obtained.  The lesion and surrounding area was prepped with an alcohol swab. The skin was then stretched perpendicular to the skin tension lines and the lesion removed using the  . The resulting ellipse cauterized w/ silver nitrate. Antibiotic ointment and a sterile dressing applied. The specimen was sent for pathologic examination. The patient tolerated the procedure well.  Condition: Stable  Complications: none.  Plan: 1. Instructed to keep the wound dry and covered for 24-48 hours and clean thereafter. 2. Warning signs of infection were reviewed.   3. Patient will be contacted with the results of the pathology when available.

## 2016-01-30 NOTE — Progress Notes (Signed)
CC: MCTD HPI: Sonya Smith is a 55 y.o. female with a h/o of pain and swelling of the bilateral lower extremity, diffuse discoid rash who presents for follow-up of the above.  Please see Problem-based charting for HPI and the status of patient's chronic medical conditions.  Past Medical History:  Diagnosis Date  . Arthritis    of cervical and lumbar spine  . Asthma    undiagnosed, was told by a physician in the ED, patient does not take any inhalers at home.   . Chest pain    multiple ED visit, no objective evidence of cardiac or pulmonary causes  of chest pain in past   Social Hx: Currently unemployed. Used to work in Personnel officerfood service. H/o smoking, quit 10-yrs ago.  Review of Systems: ROS in HPI. Otherwise: Review of Systems  Constitutional: Positive for malaise/fatigue. Negative for chills, fever and weight loss.  Eyes: Negative for blurred vision.  Respiratory: Negative for cough and shortness of breath.   Cardiovascular: Positive for chest pain (episodic sharp pain last seconds, self-limited), palpitations and leg swelling (Painful woody, nodular swelling).  Gastrointestinal: Negative for abdominal pain, constipation, diarrhea, nausea and vomiting.  Genitourinary: Negative for dysuria, frequency and urgency.  Skin: Positive for itching and rash.  Neurological: Negative for dizziness, tremors, focal weakness, weakness and headaches.  Endo/Heme/Allergies: Negative for polydipsia.  Psychiatric/Behavioral: Negative for depression and substance abuse.   Physical Exam: Vitals:   01/30/16 1350  BP: (!) 151/80  Pulse: 64  Temp: 98 F (36.7 C)  TempSrc: Oral  SpO2: 97%  Weight: 249 lb (112.9 kg)   Physical Exam  Constitutional: She is oriented to person, place, and time. She appears well-developed. She is cooperative. No distress.  Cardiovascular: Normal rate, regular rhythm, normal heart sounds and normal pulses.  Exam reveals no gallop.   No murmur  heard. Pulmonary/Chest: Effort normal and breath sounds normal. No respiratory distress. She has no wheezes. She has no rhonchi. She has no rales. Breasts are symmetrical.  Abdominal: Soft. Bowel sounds are normal. There is no tenderness.  Musculoskeletal: She exhibits edema (woody, nodular swelling of bilateral LE) and tenderness (BLE).  Neurological: She is alert and oriented to person, place, and time.  Skin: Rash (discoid rash on the BUE and shawl-pattern macular erythema; discoid intertriginous rash below breasts) noted.  Psychiatric: She has a normal mood and affect.  Tearful and upset about prospect of chronic uncurable autoimmune disease, consoled w/ discussion of diagnostic and treatment options    Assessment & Plan:  See encounters tab for problem based medical decision making. Patient seen with Dr. 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 She has not had any other positive antibodies including ANA, anti-ro/la, anti-smooth muscle, RF. Patient did have a mildly elevated CRP. Today she continues to complain of diffuse rash over the upper extremities most prominent in a shawl distribution over her chest and shoulders. She has been using hydrocortisone cream for this with some success. She also complains of annular rash in the intertriginous folds under her breast which she has been treated with nystatin cream.  Patient notes that she has a strong family history of autoimmune conditions, which is most prominent in her uncle's family. But she does note that her brother has Behcet's disease. At this time she also endorses some diffuse arthritic pains in her knees and elbows and hands. She notes some mild  symptoms of Raynaud's phenomenon in her hands and feet.  Patient's primary complaint is the pain that she experiences in her bilateral lower extremity which is associated with  nodular and woody swelling that waxes and wanes. She also has erythematous rash which is tender to palpation and fluctuates in its location and intensity. This rash is very similar in appearance to that of venous stasis rashes. Patient has been treating this pain with ibuprofen 400 mg every 6 hours and oxycodone 5 mg as needed. Patient denies any weakness in the lower extremities, but does walk with a cane due to significant pain.  We'll plan to send a punch biopsy of one of the skin lesions of the lower extremity to surgical path for analysis. Our concern is to differentiate suspected venous stasis from rash associated with mixed connective tissue disease. We will also plan to alter her NSAID regimen from ibuprofen 400 mg to meloxicam 7.5 mg daily, however it was noted that if the patient was not able to perform meloxicam she could try 600 mg ibuprofen every 6. I will also send an anti-CCP antibody today in order to rule out possibility of rheumatoid arthritis.  We will plan to follow-up on findings from surgical path and if indicated may start DMARD therapy at that time with Plaquenil. Short course of steroids was considered, however patient notes that her symptoms have been mildly improved since last visit with Dr. Lawerance Bach several weeks ago. We will defer steroid treatment at this time.  We will plan to follow-up with the patient in 5 weeks and I will contact her with the results of her surgical pathology as soon as they're available.  Signed: Carolynn Comment, MD 01/30/2016, 2:00 PM  Pager: [phone]

## 2016-01-30 NOTE — Patient Instructions (Signed)
We will send a sample of your skin for examination with pathology. For now we will start meloxicam which she can take 2 times every day for the pain in her legs. We will hold off on starting the steroids until we get the results back from the skin biopsy. He can continue to use the skin cream, hydrocortisone, for your rash.

## 2016-01-31 ENCOUNTER — Encounter: Payer: Self-pay | Admitting: Licensed Clinical Social Worker

## 2016-01-31 NOTE — Progress Notes (Signed)
Internal Medicine Clinic Attending  I saw and evaluated the patient.  I personally confirmed the key portions of the history and exam documented by Dr. Marinda ElkStrelow and I reviewed pertinent patient test results.  The assessment, diagnosis, and plan were formulated together and I agree with the documentation in the resident's note. I was present for the entirety of the procedure.

## 2016-02-01 LAB — CYCLIC CITRUL PEPTIDE ANTIBODY, IGG/IGA: Cyclic Citrullin Peptide Ab: 5 units (ref 0–19)

## 2016-02-03 ENCOUNTER — Telehealth (HOSPITAL_BASED_OUTPATIENT_CLINIC_OR_DEPARTMENT_OTHER): Payer: Self-pay | Admitting: *Deleted

## 2016-02-03 NOTE — Telephone Encounter (Signed)
Post ED Visit - Positive Culture Follow-up: Unsuccessful Patient Follow-up  Culture assessed and recommendations reviewed by: []  Enzo BiNathan Batchelder, Pharm.D. []  Celedonio MiyamotoJeremy Frens, Pharm.D., BCPS []  Garvin FilaMike Maccia, Pharm.D. []  Georgina PillionElizabeth Martin, Pharm.D., BCPS []  MeeteetseMinh Pham, [place]arm.D., BCPS, AAHIVP []  Estella HuskMichelle Turner, Pharm.D., BCPS, AAHIVP []  Tennis Mustassie Stewart, Pharm.D. []  Sherle Poeob Vincent, [address]Pharm[address]  Positive blood  culture  []  Patient discharged without antimicrobial prescription and treatment is now indicated []  Organism is resistant to prescribed ED discharge antimicrobial [x]  Patient with positive blood cultures   Unable to contact patient after 3 attempts, letter sent to address on file with no response.  No further treatment received.  Virl AxeRobertson, Courtland Reas [facility] 02/03/2016, 11:21 AM

## 2016-02-06 ENCOUNTER — Telehealth: Payer: Self-pay | Admitting: Internal Medicine

## 2016-02-06 NOTE — Telephone Encounter (Signed)
I will send information to her doctor.

## 2016-02-06 NOTE — Telephone Encounter (Signed)
HLAB-27 gene is what her uncle has for auto immune.   Pt was told to call and give this information

## 2016-03-04 ENCOUNTER — Telehealth: Payer: Self-pay | Admitting: Internal Medicine

## 2016-03-04 NOTE — Telephone Encounter (Signed)
APT. REMINDER CALL, LMTCB °

## 2016-03-05 ENCOUNTER — Ambulatory Visit (INDEPENDENT_AMBULATORY_CARE_PROVIDER_SITE_OTHER): Payer: Self-pay | Admitting: Internal Medicine

## 2016-03-05 VITALS — BP 167/73 | HR 55 | Temp 97.9°F | Ht 64.0 in | Wt 241.1 lb

## 2016-03-05 DIAGNOSIS — D8989 Other specified disorders involving the immune mechanism, not elsewhere classified: Secondary | ICD-10-CM

## 2016-03-05 DIAGNOSIS — M79605 Pain in left leg: Secondary | ICD-10-CM

## 2016-03-05 DIAGNOSIS — M351 Other overlap syndromes: Secondary | ICD-10-CM

## 2016-03-05 DIAGNOSIS — M79662 Pain in left lower leg: Secondary | ICD-10-CM

## 2016-03-05 DIAGNOSIS — Z79891 Long term (current) use of opiate analgesic: Secondary | ICD-10-CM

## 2016-03-05 DIAGNOSIS — Z87891 Personal history of nicotine dependence: Secondary | ICD-10-CM

## 2016-03-05 DIAGNOSIS — M7989 Other specified soft tissue disorders: Secondary | ICD-10-CM

## 2016-03-05 DIAGNOSIS — M79604 Pain in right leg: Secondary | ICD-10-CM

## 2016-03-05 MED ORDER — HYDROXYCHLOROQUINE SULFATE 200 MG PO TABS: 200.0000 mg | ORAL_TABLET | Freq: Every day | ORAL | 0 refills | Status: DC

## 2016-03-05 MED ORDER — OXYCODONE-ACETAMINOPHEN 5-325 MG PO TABS: ORAL_TABLET | ORAL | 0 refills | Status: DC

## 2016-03-05 MED ORDER — MELOXICAM 7.5 MG PO TABS: 7.5000 mg | ORAL_TABLET | Freq: Every day | ORAL | 1 refills | Status: DC

## 2016-03-05 MED FILL — OXYCODONE/APAP 5-325: 5-325 | 4 days supply | Qty: 30 | Fill #0

## 2016-03-05 MED FILL — HYDROXYCHLOROQUINE 200 MG T: 200 | 30 days supply | Qty: 30 | Fill #0

## 2016-03-05 NOTE — Assessment & Plan Note (Signed)
Patient continues to report pain and swelling of the left and right lower extremities. She reports that the swelling extends up to the knee and sometimes when necessary. Is typically worse in the evening and her legs are skinny as in the morning when she first wakes up. Punch biopsy from last visit revealed chronic venous stasis changes but no signs of classic mixed connective tissue disease. Patient is having difficulty with walking, and is unable to take meloxicam due to some side effects of shortness of breath and chest pain which were worrisome for her. Has been taking Percocet with good success but is currently out of this medication.  I feel that her symptoms are most likely multifactorial both from chronic venous stasis as well as her MCTD. We will recommend compression stockings and elevation in addition to continuing ibuprofen 400 mg when necessary pain. We will also refill Percocet 5-325 mg 1 tab every 4 hours as needed for pain #30.

## 2016-03-05 NOTE — Patient Instructions (Signed)
We will plan to start a medication called Plaquenil. You will take 200 mg of this medication every day. This will hopefully help with the mixed connective tissue disorder that is causing the rash and pain in her legs. One of the most concerning side effects of this medication is a small chance of damage to the eye. It will be important to get an eye exam within the next year so that we have a baseline for which her eyes look like right now. If in the future we will notedany changes to your skin or changing changes to your eye exam will be able to stop this medication before it caused any long-term problems.  I will make a referral for you to see the eye doctor today. We will plan to follow-up in one month to see how this medication is working.

## 2016-03-05 NOTE — Progress Notes (Signed)
CC: Sonya Smith HPI: [name] is a 55 y.o. female with a h/o of Sonya Smith and anxiety who presents for f/u of LE pain and Sonya Smith.  Please see Problem-based charting for HPI and the status of patient's chronic medical conditions.  Past Medical History:  Diagnosis Date  . Arthritis    of cervical and lumbar spine  . Asthma    undiagnosed, was told by a physician in the ED, patient does not take any inhalers at home.   . Chest pain    multiple ED visit, no objective evidence of cardiac or pulmonary causes  of chest pain in past    Social Hx: Unemployed. H/o tobacco abuse, quit 10-years ago.  Review of Systems: ROS in HPI. Otherwise: Review of Systems  Constitutional: Negative for chills, fever and weight loss.  Respiratory: Positive for shortness of breath (1 episode w/ Meloxicam several wks ago). Negative for cough.   Cardiovascular: Positive for chest pain (1 episode w/ Meloxicam several weeks ago). Negative for leg swelling.  Gastrointestinal: Negative for abdominal pain, constipation, diarrhea, nausea and vomiting.  Genitourinary: Negative for dysuria, frequency and urgency.  Musculoskeletal: Positive for joint pain (hands) and myalgias (BLE). Negative for back pain.  Skin: Positive for rash (discoid BUE, heliotrope chest and shoulders).  Neurological: Negative for headaches.    Physical Exam: Vitals:   03/05/16 1353  BP: (!) 167/73  Pulse: (!) 55  Temp: 97.9 F (36.6 C)  TempSrc: Oral  SpO2: 98%  Weight: 241 lb 1.6 oz (109.4 kg)  Height: 5' 4"  (1.626 m)   Physical Exam  Constitutional: She appears well-developed. She is cooperative. No distress.  Cardiovascular: Normal rate, regular rhythm, normal heart sounds and normal pulses.  Exam reveals no gallop.   No murmur heard. Pulmonary/Chest: Effort normal and breath sounds normal. No respiratory distress. She has no wheezes. She has no rhonchi. She has no rales. Breasts are symmetrical.  Abdominal: Soft. Bowel sounds  are normal. There is no tenderness.  Musculoskeletal: She exhibits edema (2+ pitting, venous stasis changes to BLE) and tenderness (Diffuse TTP over BLE from ankles to the mid shin).  Skin: Skin is warm and dry. Rash (improved discoid rash in BUE. Improving erythematous heliotropic rash on chest, shoulders clear, ring worm improved on intertriginous regions below breasts) noted.    Assessment & Plan:  See encounters tab for problem based medical decision making. Patient seen with Dr. Angelia Mould  Mixed connective tissue disease (Sonya Smith) Pt reports that Sonya Smith runs in her family. She reports that she is still having bilateral lower extremity pain and swelling. However this is somewhat improved from last visit. She also notes that her upper extremity rash and rash on her chest and shoulders is improved. She's been using hydrocortisone cream with some success. Her lower show a punch biopsy was not significant for Sonya Smith changes.  Plan to start Sonya Smith therapy given abnormal Sonya Smith and clinical signs of Sonya Smith. Will initiate Sonya Smith 236m qD. Plan to f/u in 2 months. Made referral to optometry (given Sonya Smith coverage) for baseline retinal exam. Was unable to obtain referral to rheumatology given lack of funds for out-of-pocket co-pay at Sonya Smith Patient plans to pursue Medicaid and disability and this may help to get her connected with rheumatology in the future. We will continue to address at follow-up.  Pain and swelling of left lower leg Patient continues to report pain and swelling of the left and right lower extremities. She reports that the swelling extends  up to the knee and sometimes when necessary. Is typically worse in the evening and her legs are skinny as in the morning when she first wakes up. Punch biopsy from last visit revealed chronic venous stasis changes but no signs of classic mixed connective tissue disease. Patient is having difficulty with walking, and is unable to take meloxicam due  to some side effects of shortness of breath and chest pain which were worrisome for her. Has been taking Sonya Smith with good success but is currently out of this medication.  I feel that her symptoms are most likely multifactorial both from chronic venous stasis as well as her Sonya Smith. We will recommend compression stockings and elevation in addition to continuing ibuprofen 400 mg when necessary pain. We will also refill Sonya Smith 5-325 mg 1 tab every 4 hours as needed for pain #30.   Signed: Holley Raring, MD 03/05/2016, 3:30 PM  Pager: [phone]

## 2016-03-05 NOTE — Assessment & Plan Note (Addendum)
Pt reports that HLA-B27 runs in her family. She reports that she is still having bilateral lower extremity pain and swelling. However this is somewhat improved from last visit. She also notes that her upper extremity rash and rash on her chest and shoulders is improved. She's been using hydrocortisone cream with some success. Her lower show a punch biopsy was not significant for MCTD changes.  Plan to start DMARD therapy given abnormal RNP and clinical signs of MCTD. Will initiate Plaquenil 253m qD. Plan to f/u in 2 months. Made referral to optometry (given [employer] Card coverage) for baseline retinal exam. Was unable to obtain referral to rheumatology given [facility] plans to [facility] Medicaid and disability and this may help to get her connected with rheumatology in the future. We will continue to address at follow-up.

## 2016-03-10 NOTE — Progress Notes (Signed)
Internal Medicine Clinic Attending  I saw and evaluated the patient.  I personally confirmed the key portions of the history and exam documented by Dr. Strelow and I reviewed pertinent patient test results.  The assessment, diagnosis, and plan were formulated together and I agree with the documentation in the resident's note. 

## 2016-04-03 ENCOUNTER — Other Ambulatory Visit: Payer: Self-pay

## 2016-04-03 ENCOUNTER — Other Ambulatory Visit: Payer: Self-pay | Admitting: *Deleted

## 2016-04-03 DIAGNOSIS — D8989 Other specified disorders involving the immune mechanism, not elsewhere classified: Secondary | ICD-10-CM

## 2016-04-03 MED ORDER — OXYCODONE-ACETAMINOPHEN 5-325 MG PO TABS: ORAL_TABLET | ORAL | 0 refills | Status: DC

## 2016-04-03 NOTE — Telephone Encounter (Signed)
oxyCODONE-acetaminophen (PERCOCET/ROXICET) 5-325 MG tablet, refill request.  

## 2016-04-03 NOTE — Telephone Encounter (Signed)
duplicate

## 2016-04-07 MED FILL — OXYCODONE W/APAP 5/325 TAB: 5-325 | 3 days supply | Qty: 30 | Fill #0

## 2016-05-06 ENCOUNTER — Telehealth: Payer: Self-pay | Admitting: Internal Medicine

## 2016-05-06 NOTE — Telephone Encounter (Signed)
APT. REMINDER CALL, LMTCB °

## 2016-05-07 ENCOUNTER — Ambulatory Visit (INDEPENDENT_AMBULATORY_CARE_PROVIDER_SITE_OTHER): Payer: Self-pay | Admitting: Internal Medicine

## 2016-05-07 ENCOUNTER — Encounter (INDEPENDENT_AMBULATORY_CARE_PROVIDER_SITE_OTHER): Payer: Self-pay

## 2016-05-07 ENCOUNTER — Encounter: Payer: Self-pay | Admitting: Internal Medicine

## 2016-05-07 VITALS — BP 130/87 | HR 72 | Temp 97.8°F | Wt 242.1 lb

## 2016-05-07 DIAGNOSIS — Z87891 Personal history of nicotine dependence: Secondary | ICD-10-CM

## 2016-05-07 DIAGNOSIS — Z79899 Other long term (current) drug therapy: Secondary | ICD-10-CM

## 2016-05-07 DIAGNOSIS — M25531 Pain in right wrist: Secondary | ICD-10-CM

## 2016-05-07 DIAGNOSIS — M25541 Pain in joints of right hand: Secondary | ICD-10-CM

## 2016-05-07 DIAGNOSIS — B372 Candidiasis of skin and nail: Secondary | ICD-10-CM

## 2016-05-07 DIAGNOSIS — D8989 Other specified disorders involving the immune mechanism, not elsewhere classified: Secondary | ICD-10-CM

## 2016-05-07 DIAGNOSIS — F419 Anxiety disorder, unspecified: Secondary | ICD-10-CM

## 2016-05-07 DIAGNOSIS — M25532 Pain in left wrist: Secondary | ICD-10-CM

## 2016-05-07 DIAGNOSIS — M25542 Pain in joints of left hand: Secondary | ICD-10-CM

## 2016-05-07 DIAGNOSIS — M351 Other overlap syndromes: Secondary | ICD-10-CM

## 2016-05-07 DIAGNOSIS — M7989 Other specified soft tissue disorders: Secondary | ICD-10-CM

## 2016-05-07 DIAGNOSIS — M79662 Pain in left lower leg: Secondary | ICD-10-CM

## 2016-05-07 MED ORDER — OXYCODONE-ACETAMINOPHEN 5-325 MG PO TABS: ORAL_TABLET | ORAL | 0 refills | Status: DC

## 2016-05-07 MED ORDER — NYSTATIN 100000 UNIT/GM EX CREA: 1.0000 "application " | TOPICAL_CREAM | Freq: Two times a day (BID) | CUTANEOUS | 0 refills | Status: DC

## 2016-05-07 MED ORDER — IBUPROFEN 800 MG PO TABS: 400.0000 mg | ORAL_TABLET | Freq: Two times a day (BID) | ORAL | 1 refills | Status: DC

## 2016-05-07 MED FILL — NYSTATIN 100,000 UNIT/GM CR: 100000 | 15 days supply | Qty: 30 | Fill #0

## 2016-05-07 MED FILL — OXYCODONE W/APAP 5/325 TAB: 5-325 | 4 days supply | Qty: 30 | Fill #0

## 2016-05-07 NOTE — Assessment & Plan Note (Signed)
Patient reports that her anxiety significantly improved. She does not note any significant symptoms such as palpitations shortness of breath feelings of dread. She has not been on medication for this and I feel that she is stable with her current behavioral modifications, no pharmacotherapy indicated.

## 2016-05-07 NOTE — Assessment & Plan Note (Signed)
Patient was unable to tolerate Plaquenil due to headaches and complained of blurry vision. She reports she took 6 days worth of this medication prior to discontinuation. She notes that she did not feel any significant improvement in her symptoms on this short therapy. Patient reports that her polyarthralgia is still bothering her but is stable. She notes that she has some significantly worsening symptoms in cold weather and this is helped by warming her hands and feet or by putting on gloves and socks. She notes that she does not get significant swelling, but does have pain with movement of her fingers and wrist joints. The pain is relieved by Percocet as well as by ibuprofen we have been concerned about a rheumatologic arthritis and her previous lab work has been positive for RNP antibody suggestive of mixed connective tissue disorder. She does not currently meet clinical criteria for MCTD, and we would like specialty evaluation by rheumatology however her financial situation without insurance has been a significant barrier. We recommend that she continue to attempt to obtain Medicaid insurance. In the meantime we will continue with NSAID therapy.

## 2016-05-07 NOTE — Progress Notes (Signed)
CC: Arthalgia HPI: [name] is a 55 y.o. female with a h/o of anxiety, arthitis (undefined), and atypical CP who presents for follow up of her arthralgia and leg pain.  Please see Problem-based charting for HPI and the status of patient's chronic medical conditions.  Past Medical History:  Diagnosis Date  . Arthritis    of cervical and lumbar spine  . Asthma    undiagnosed, was told by a physician in the ED, patient does not take any inhalers at home.   . Chest pain    multiple ED visit, no objective evidence of cardiac or pulmonary causes  of chest pain in past    Social Hx: Pt is currently unemployed. Non-smoker.   Review of Systems: ROS in HPI. Otherwise: Review of Systems  Constitutional: Negative for chills, fever and weight loss.  HENT: Negative for congestion, sinus pain and sore throat.   Eyes: Positive for blurred vision.  Respiratory: Negative for cough, shortness of breath and stridor.   Cardiovascular: Positive for leg swelling. Negative for chest pain.  Gastrointestinal: Negative for abdominal pain, constipation, diarrhea, nausea and vomiting.  Genitourinary: Negative for dysuria, frequency and urgency.  Musculoskeletal: Positive for joint pain and myalgias.  Neurological: Positive for headaches.    Physical Exam: Vitals:   05/07/16 1343  BP: 130/87  Pulse: 72  Temp: 97.8 F (36.6 C)  TempSrc: Oral  SpO2: 100%  Weight: 242 lb 1.6 oz (109.8 kg)   Physical Exam  Constitutional: She appears well-developed. She is cooperative. No distress.  Cardiovascular: Normal rate, regular rhythm, normal heart sounds and normal pulses.  Exam reveals no gallop.   No murmur heard. Pulmonary/Chest: Effort normal and breath sounds normal. No respiratory distress. She has no wheezes. She has no rhonchi. She has no rales. Breasts are symmetrical.  Abdominal: Soft. Bowel sounds are normal. There is no tenderness.  Musculoskeletal: She exhibits no edema.  Venous  stasis changes of bilat LE. 1+ woody edema bilat. TTP over pre-tibial skin.  Skin: Skin is warm and dry. Rash noted.  Heliotropic rash over chest and malar rash on face.    Assessment & Plan:  See encounters tab for problem based medical decision making. Patient seen with Dr. Oswaldo DoneVincent  Pain and swelling of left lower leg Patient continues to have pain and swelling of bilateral lower extremities. These symptoms are associated with swelling and venous stasis skin changes which appear typical. She notes that she gets some mild relief with elevation and compression stockings. She also notes that these symptoms are tolerable and do not limit her significant relief from her ADLs. We will continue our current therapy with recognitions of elevation and compression, as well as encouraged continued activity and weight loss.  Mixed connective tissue disease (HCC) Patient was unable to tolerate Plaquenil due to headaches and complained of blurry vision. She reports she took 6 days worth of this medication prior to discontinuation. She notes that she did not feel any significant improvement in her symptoms on this short therapy. Patient reports that her polyarthralgia is still bothering her but is stable. She notes that she has some significantly worsening symptoms in cold weather and this is helped by warming her hands and feet or by putting on gloves and socks. She notes that she does not get significant swelling, but does have pain with movement of her fingers and wrist joints. The pain is relieved by Percocet as well as by ibuprofen we have been concerned about a  rheumatologic arthritis and her previous lab work has been positive for RNP antibody suggestive of mixed connective tissue disorder. She does not currently meet clinical criteria for MCTD, and we would like specialty evaluation by rheumatology however her financial situation without insurance has been a significant barrier. We recommend that she continue  to attempt to obtain Medicaid insurance. In the meantime we will continue with NSAID therapy.  Intertriginous candidiasis Patient is complaining of rash and itching below her breast tissue. This intertriginous skin has rash consistent with ringworm and she has been treating this with nystatin to good relief. We will continue statin therapy and have ordered a refill today.  Anxiety Patient reports that her anxiety significantly improved. She does not note any significant symptoms such as palpitations shortness of breath feelings of dread. She has not been on medication for this and I feel that she is stable with her current behavioral modifications, no pharmacotherapy indicated.   Signed: Carolynn CommentBryan Cambre Matson, MD 05/07/2016, 2:50 PM  Pager: [phone]

## 2016-05-07 NOTE — Patient Instructions (Signed)
I would like you to continue to use the Ibuprofen on a regular basis to help with your arthritis. You can take this regularly in order to prevent the pain from becoming severe.  Please continue to work on getting qualified for Medicaid as this will be important to help make a referral to a specialist for further diagnosis and treatment.  If you notice that your hands become very swollen, please make an appointment to come in and let us look at your hands as it may help us to clarify the diagnosis.  Thanks for you visit today and Happy Holidays.

## 2016-05-07 NOTE — Assessment & Plan Note (Signed)
Patient is complaining of rash and itching below her breast tissue. This intertriginous skin has rash consistent with ringworm and she has been treating this with nystatin to good relief. We will continue statin therapy and have ordered a refill today.

## 2016-05-07 NOTE — Assessment & Plan Note (Signed)
Patient continues to have pain and swelling of bilateral lower extremities. These symptoms are associated with swelling and venous stasis skin changes which appear typical. She notes that she gets some mild relief with elevation and compression stockings. She also notes that these symptoms are tolerable and do not limit her significant relief from her ADLs. We will continue our current therapy with recognitions of elevation and compression, as well as encouraged continued activity and weight loss.

## 2016-05-08 NOTE — Progress Notes (Signed)
Internal Medicine Clinic Attending  I saw and evaluated the patient.  I personally confirmed the key portions of the history and exam documented by Dr. Strelow and I reviewed pertinent patient test results.  The assessment, diagnosis, and plan were formulated together and I agree with the documentation in the resident's note. 

## 2016-05-22 ENCOUNTER — Telehealth: Payer: Self-pay | Admitting: Internal Medicine

## 2016-05-22 NOTE — Telephone Encounter (Signed)
CALLED PT TIME TO RENEW [employer] CARD, NO ANSWER, NO VOICEMAIL °

## 2016-06-08 ENCOUNTER — Other Ambulatory Visit: Payer: Self-pay | Admitting: Internal Medicine

## 2016-06-08 DIAGNOSIS — D8989 Other specified disorders involving the immune mechanism, not elsewhere classified: Secondary | ICD-10-CM

## 2016-06-08 NOTE — Telephone Encounter (Signed)
Refill request  For oxyCODONE-acetaminophen

## 2016-06-09 ENCOUNTER — Telehealth: Payer: Self-pay | Admitting: Internal Medicine

## 2016-06-09 NOTE — Telephone Encounter (Signed)
APT. REMINDER CALL, LMTCB °

## 2016-06-09 NOTE — Telephone Encounter (Signed)
Please schedule pt for [facility] visit this month while I am in clinic for reevaluation of pain and potential refill of Percocet. Pt will need pain contract and UDS at this visit if we will continue her narcotics.

## 2016-06-09 NOTE — Telephone Encounter (Signed)
Called pt, [facility] Marinda Elkstrelow

## 2016-06-10 ENCOUNTER — Ambulatory Visit (INDEPENDENT_AMBULATORY_CARE_PROVIDER_SITE_OTHER): Payer: Self-pay | Admitting: Internal Medicine

## 2016-06-10 VITALS — BP 139/88 | HR 53 | Temp 97.6°F | Ht 64.0 in | Wt 239.6 lb

## 2016-06-10 DIAGNOSIS — Z87891 Personal history of nicotine dependence: Secondary | ICD-10-CM

## 2016-06-10 DIAGNOSIS — F1120 Opioid dependence, uncomplicated: Secondary | ICD-10-CM

## 2016-06-10 DIAGNOSIS — Z79891 Long term (current) use of opiate analgesic: Secondary | ICD-10-CM | POA: Insufficient documentation

## 2016-06-10 DIAGNOSIS — D8989 Other specified disorders involving the immune mechanism, not elsewhere classified: Secondary | ICD-10-CM

## 2016-06-10 DIAGNOSIS — M351 Other overlap syndromes: Secondary | ICD-10-CM

## 2016-06-10 MED ORDER — OXYCODONE-ACETAMINOPHEN 5-325 MG PO TABS: ORAL_TABLET | ORAL | 0 refills | Status: DC

## 2016-06-10 MED FILL — OXYCODONE W/APAP 5/325 TAB: 5-325 | 4 days supply | Qty: 30 | Fill #0

## 2016-06-10 NOTE — Progress Notes (Signed)
Internal Medicine Clinic Attending  Case discussed with Dr. Strelow at the time of the visit.  We reviewed the resident's history and exam and pertinent patient test results.  I agree with the assessment, diagnosis, and plan of care documented in the resident's note.  

## 2016-06-10 NOTE — Assessment & Plan Note (Addendum)
3 x 1 month Percocet 5-325mg  Rx given #30, instructions, take 0.5 to 1 pill daily as needed for severe pain. Pain contract signed today. UDS sent.  Patient does report occasional marijuana use and we discussed that she would have to discontinue this practice to continue prescribing her pain medications.

## 2016-06-10 NOTE — Progress Notes (Signed)
CC: leg pain, hand pain HPI: [name] is a 56 y.o. female with a h/o of autoimmune disease who presents for f/u of the above and to initiate pain contract for chronic opioid therapy.  Please see Problem-based charting for HPI and the status of patient's chronic medical conditions.  Past Medical History:  Diagnosis Date  . Arthritis    of cervical and lumbar spine  . Asthma    undiagnosed, was told by a physician in the ED, patient does not take any inhalers at home.   . Chest pain    multiple ED visit, no objective evidence of cardiac or pulmonary causes  of chest pain in past    Social Hx: Social History  Substance Use Topics  . Smoking status: Former Smoker    Packs/day: 1.00    Years: 30.00    Types: Cigarettes    Quit date: 10/09/2003  . Smokeless tobacco: Never Used  . Alcohol use No   Review of Systems: ROS in HPI. Otherwise: Review of Systems  Constitutional: Negative for chills, fever and weight loss.  Respiratory: Negative for cough and shortness of breath.   Cardiovascular: Negative for chest pain and leg swelling.  Gastrointestinal: Negative for abdominal pain, constipation, diarrhea, nausea and vomiting.  Genitourinary: Negative for dysuria, frequency and urgency.  Musculoskeletal: Positive for joint pain.    Physical Exam: Vitals:   06/10/16 0903  BP: 139/88  Pulse: (!) 53  Temp: 97.6 F (36.4 C)  TempSrc: Oral  SpO2: 99%  Weight: 239 lb 9.6 oz (108.7 kg)  Height: 5\' 4"  (1.626 m)   Physical Exam  Constitutional: She appears well-developed. She is cooperative. No distress.  Cardiovascular: Normal rate, regular rhythm, normal heart sounds and normal pulses.  Exam reveals no gallop.   No murmur heard. Pulmonary/Chest: Effort normal and breath sounds normal. No respiratory distress. She has no wheezes. She has no rhonchi. She has no rales. Breasts are symmetrical.  Abdominal: Soft. Bowel sounds are normal. There is no tenderness.    Musculoskeletal: She exhibits edema and tenderness.  Skin:  Diffuse macular rash, improved from previous exams. Intertriginous ring worm, also improved from previous.    Assessment & Plan:  See encounters tab for problem based medical decision making. Patient discussed with Dr. Cleda DaubE. Hoffman  Opioid dependence (HCC) 3 x 1 month Percocet 5-325mg  Rx given #30, instructions, take 0.5 to 1 pill daily as needed for severe pain. Pain contract signed today. UDS sent.  Patient does report occasional marijuana use and we discussed that she would have to discontinue this practice to continue prescribing her pain medications.  Mixed connective tissue disease (HCC) Patient is complaining of significant pain in her lower show his particular her left foot and ankle. She has some mild edema. She also complains of significant pain in her bilateral hands together in her thumb joints. We've been attempting to get her referral to rheumatology but she has financial barriers and is continuing to work on this. Incidentally has been ineffective, she has been taking 800 mg ibuprofen twice a day with only minimal relief. She has also been taking Percocet 5-325 MG daily which has allowed her to be functional. We will plan to continue prescribing Percocet with plan to eventually transition her to rheumatology care for further evaluation and treatment. In the meantime we will continue prescribing low-dose, once daily Percocet - pain contract signed today.   Signed: Carolynn CommentBryan Anwita Mencer, MD 06/10/2016, 12:07 PM  Pager: [phone]

## 2016-06-10 NOTE — Assessment & Plan Note (Signed)
Patient is complaining of significant pain in her lower show his particular her left foot and ankle. She has some mild edema. She also complains of significant pain in her bilateral hands together in her thumb joints. We've been attempting to get her referral to rheumatology but she has financial barriers and is continuing to work on this. Incidentally has been ineffective, she has been taking 800 mg ibuprofen twice a day with only minimal relief. She has also been taking Percocet 5-325 MG daily which has allowed her to be functional. We will plan to continue prescribing Percocet with plan to eventually transition her to rheumatology care for further evaluation and treatment. In the meantime we will continue prescribing low-dose, once daily Percocet - pain contract signed today.

## 2016-06-17 LAB — TOXASSURE SELECT,+ANTIDEPR,UR

## 2016-07-13 MED FILL — OXYCODONE W/APAP 5/325 TAB: 5-325 | 4 days supply | Qty: 30 | Fill #0

## 2016-08-11 ENCOUNTER — Telehealth: Payer: Self-pay | Admitting: *Deleted

## 2016-08-11 MED FILL — OXYCODONE W/APAP 5/325 TAB: 5-325 | 4 days supply | Qty: 30 | Fill #0

## 2016-08-11 NOTE — Telephone Encounter (Signed)
Pt presents upset because she was told she would need to pay $30.00 this month for her percocet, she has been paying $11.95, she has accidentally let her orange card expire, she has talked to debh. And will be getting orange card renewed. Called op pharm and spoke to [employer] pharmacist, she spoke to [employer] furr and pt will be able to get med for 11.95 this month but if orange card not in place next month will be $30.00 made pt aware and she states she will be straight next month.

## 2016-08-13 ENCOUNTER — Encounter: Payer: Self-pay | Admitting: Internal Medicine

## 2016-08-13 ENCOUNTER — Ambulatory Visit: Payer: Self-pay

## 2016-08-27 ENCOUNTER — Telehealth: Payer: Self-pay | Admitting: Internal Medicine

## 2016-08-27 NOTE — Telephone Encounter (Signed)
APT. REMINDER CALL, LMTCB °

## 2016-08-31 ENCOUNTER — Ambulatory Visit: Payer: Self-pay

## 2016-09-17 ENCOUNTER — Encounter: Payer: Self-pay | Admitting: Internal Medicine

## 2016-09-17 ENCOUNTER — Ambulatory Visit (INDEPENDENT_AMBULATORY_CARE_PROVIDER_SITE_OTHER): Payer: Self-pay | Admitting: Internal Medicine

## 2016-09-17 VITALS — BP 154/90 | HR 71 | Temp 98.2°F | Ht 64.0 in | Wt 240.3 lb

## 2016-09-17 DIAGNOSIS — M542 Cervicalgia: Secondary | ICD-10-CM

## 2016-09-17 DIAGNOSIS — Z809 Family history of malignant neoplasm, unspecified: Secondary | ICD-10-CM

## 2016-09-17 DIAGNOSIS — M25541 Pain in joints of right hand: Secondary | ICD-10-CM

## 2016-09-17 DIAGNOSIS — R21 Rash and other nonspecific skin eruption: Secondary | ICD-10-CM

## 2016-09-17 DIAGNOSIS — Z79891 Long term (current) use of opiate analgesic: Secondary | ICD-10-CM

## 2016-09-17 DIAGNOSIS — M25571 Pain in right ankle and joints of right foot: Secondary | ICD-10-CM

## 2016-09-17 DIAGNOSIS — M791 Myalgia: Secondary | ICD-10-CM

## 2016-09-17 DIAGNOSIS — Z8249 Family history of ischemic heart disease and other diseases of the circulatory system: Secondary | ICD-10-CM

## 2016-09-17 DIAGNOSIS — Z833 Family history of diabetes mellitus: Secondary | ICD-10-CM

## 2016-09-17 DIAGNOSIS — M351 Other overlap syndromes: Secondary | ICD-10-CM

## 2016-09-17 DIAGNOSIS — M25542 Pain in joints of left hand: Secondary | ICD-10-CM

## 2016-09-17 DIAGNOSIS — Z87891 Personal history of nicotine dependence: Secondary | ICD-10-CM

## 2016-09-17 DIAGNOSIS — G8929 Other chronic pain: Secondary | ICD-10-CM

## 2016-09-17 DIAGNOSIS — Z823 Family history of stroke: Secondary | ICD-10-CM

## 2016-09-17 DIAGNOSIS — M25572 Pain in left ankle and joints of left foot: Secondary | ICD-10-CM

## 2016-09-17 DIAGNOSIS — M7989 Other specified soft tissue disorders: Secondary | ICD-10-CM

## 2016-09-17 MED ORDER — OXYCODONE-ACETAMINOPHEN 5-325 MG PO TABS: ORAL_TABLET | ORAL | 0 refills | Status: DC

## 2016-09-17 MED ORDER — IBUPROFEN 800 MG PO TABS: 400.0000 mg | ORAL_TABLET | Freq: Two times a day (BID) | ORAL | 1 refills | Status: DC

## 2016-09-17 MED ORDER — NYSTATIN 100000 UNIT/GM EX CREA: 1.0000 "application " | TOPICAL_CREAM | Freq: Two times a day (BID) | CUTANEOUS | 0 refills | Status: DC

## 2016-09-17 MED FILL — OXYCODONE W/APAP 5/325 TAB: 5-325 | 4 days supply | Qty: 30 | Fill #0

## 2016-09-17 NOTE — Assessment & Plan Note (Signed)
Patient is of significant pain in multiple joints diffusely specifically in her ankle joints bilaterally, the small joints of her hand including her MCPs, pain in her cervical spine. She also has diffuse maculopapular erythematous rash across her upper show any torso and back. She also has some irregular skin changes over her bilateral lower extremities which seem more consistent with venous stasis changes. Patient has had rheumatologic workup in the past which has been revealing for several isolated abnormalities that seem most consistent with missed connective tissue disease, however I am not confident in this diagnosis. I feel the patient likely has some sort of underlying rheumatologic disorder but would greatly appreciate help from rheumatology and their expertise. She does have barriers to access with limited financial resources, even qualifying for the [facility] card she does not have access to rheumatology here in [place]. Today we've placed a referral to rheumatology and attempted to set her up an appointment with weight [facility]. Patient is hesitant to go to this appointment and she has no transportation, but at my urging feels confident that she will be able to obtain transportation to make it to this appointment initially.  We will continue to prescribe 35-325 milligrams Percocet pills monthly patient has a pain contract on file. She will continue to use ibuprofen 800 mg as needed.  Assessment: Rheumatologic disease, poorly controlled, failed Plaquenil, currently on chronic narcotics to maintain functionality with her joint pain, myalgias, diffuse rash  Plan: Continue Percocet 5-325 mg 30 pills monthly. Referral to [facility] rheumatology today. Continue to advise patient to work on applying for Medicaid.

## 2016-09-17 NOTE — Patient Instructions (Signed)
We will refill your prescriptions for pain medication today and make a referral to [facility] for Rheumatology evaluation. I think this appointment will be very important for you to attend.

## 2016-09-17 NOTE — Progress Notes (Signed)
CC: MCTD HPI: [name] is a 56 y.o. female with a h/o of MCTD, anxiety, chronic opioid use who presents for routine follow up of her opioid medications.  Please see Problem-based charting for HPI and the status of patient's chronic medical conditions.  Past Medical History:  Diagnosis Date  . Arthritis    of cervical and lumbar spine  . Asthma    undiagnosed, was told by a physician in the ED, patient does not take any inhalers at home.   . Chest pain    multiple ED visit, no objective evidence of cardiac or pulmonary causes  of chest pain in past   Social History  Substance Use Topics  . Smoking status: Former Smoker    Packs/day: 1.00    Years: 30.00    Types: Cigarettes    Quit date: 10/09/2003  . Smokeless tobacco: Never Used  . Alcohol use No   Family History  Problem Relation Age of Onset  . Diabetes Mother   . Stroke Maternal Aunt     79s  . Cancer Maternal Uncle     recurrent  . Hypertension Sister    Review of Systems: ROS in HPI. Otherwise: Review of Systems  Constitutional: Negative for chills, fever and weight loss.  Respiratory: Negative for cough and shortness of breath.   Cardiovascular: Negative for chest pain and leg swelling.  Gastrointestinal: Negative for abdominal pain, constipation, diarrhea, nausea and vomiting.  Genitourinary: Negative for dysuria, frequency and urgency.   Physical Exam: Vitals:   09/17/16 1553  BP: (!) 154/90  Pulse: 71  Temp: 98.2 F (36.8 C)  TempSrc: Oral  SpO2: 98%  Weight: 240 lb 4.8 oz (109 kg)  Height:  (1.626 m)   Physical Exam  Constitutional: She appears well-developed. She is cooperative. No distress.  Cardiovascular: Normal rate, regular rhythm, normal heart sounds and normal pulses.  Exam reveals no gallop.   No murmur heard. Pulmonary/Chest: Effort normal and breath sounds normal. No respiratory distress. She has no wheezes. She has no rhonchi. She has no rales. Breasts are  symmetrical.  Abdominal: Soft. Bowel sounds are normal. There is no tenderness.  Musculoskeletal: She exhibits no edema.    Assessment & Plan:  See encounters tab for problem based medical decision making. Patient discussed with Dr. Cyndie Chime  Mixed connective tissue disease [facility]) Patient is of significant pain in multiple joints diffusely specifically in her ankle joints bilaterally, the small joints of her hand including her MCPs, pain in her cervical spine. She also has diffuse maculopapular erythematous rash across her upper show any torso and back. She also has some irregular skin changes over her bilateral lower extremities which seem more consistent with venous stasis changes. Patient has had rheumatologic workup in the past which has been revealing for several isolated abnormalities that seem most consistent with missed connective tissue disease, however I am not confident in this diagnosis. I feel the patient likely has some sort of underlying rheumatologic disorder but would greatly appreciate help from rheumatology and their expertise. She does have barriers to access with limited financial resources, even qualifying for the [facility] card she does not have access to rheumatology here in [place]. Today we've placed a referral to rheumatology and attempted to set her up an appointment with weight force [facility]. Patient is hesitant to go to this appointment and she has no transportation, but at my urging feels confident that she will be able to obtain transportation  to make it to this appointment initially.  We will continue to prescribe 35-325 milligrams Percocet pills monthly patient has a pain contract on file. She will continue to use ibuprofen 800 mg as needed.  Assessment: Rheumatologic disease, poorly controlled, failed Plaquenil, currently on chronic narcotics to maintain functionality with her joint pain, myalgias, diffuse rash  Plan: Continue Percocet  5-325 mg 30 pills monthly. Referral to [facility] rheumatology today. Continue to advise patient to work on applying for Medicaid.  Chronic use of opiate drugs therapeutic purposes KELIE GAINEY is on chronic opioid therapy for chronic pain. The date of the controlled substances contract is referenced in the FYI and / or the overview. Date of pain contract was 06/10/16. As part of the treatment plan, the [place] controlled substance database is checked at least twice yearly and the database results are appropriate. I have last reviewed the results on 09/17/16.   The last UDS was on 06/10/16 and results are as expected. Patient needs at least a yearly UDS.   The patient is on oxycodone/acetaminophen (Percocet, Tylox) strength 5-325mg , 30 per 30 days. Adjunctive treatment includes NSAID's. This regimen allows [name] to function and does not cause excessive sedation or other side effects.  "The benefits of continuing opioid therapy outweigh the risks and chronic opioids will be continued. Ongoing education about safe opioid treatment is provided  Interventions today include: Refills - 3 paper Rx printed and Referral to rheumatology at [facility]   Signed: Carolynn Comment, MD 09/17/2016, 4:39 PM  Pager: [phone]

## 2016-09-17 NOTE — Assessment & Plan Note (Addendum)
Sonya Smith is on chronic opioid therapy for chronic pain. The date of the controlled substances contract is referenced in the FYI and / or the overview. Date of pain contract was 06/10/16. As part of the treatment plan, the [place] controlled substance database is checked at least twice yearly and the database results are appropriate. I have last reviewed the results on 09/17/16.   The last UDS was on 06/10/16 and results are as expected. Patient needs at least a yearly UDS.   The patient is on oxycodone/acetaminophen (Percocet, Tylox) strength 5-325mg , 30 per 30 days. Adjunctive treatment includes NSAID's. This regimen allows Sonya Smith to function and does not cause excessive sedation or other side effects.  "The benefits of continuing opioid therapy outweigh the risks and chronic opioids will be continued. Ongoing education about safe opioid treatment is provided  Interventions today include: Refills - 3 paper Rx printed and Referral to rheumatology at [facility]

## 2016-09-17 NOTE — Progress Notes (Signed)
Medicine attending: Medical history, presenting problems, physical findings, and medications, reviewed with resident physician Dr Bryan Strelow on the day of the patient visit and I concur with his evaluation and management plan. 

## 2016-10-19 MED FILL — OXYCODONE W/APAP 5/325 TAB: 5-325 | 4 days supply | Qty: 30 | Fill #0

## 2016-11-06 ENCOUNTER — Encounter: Payer: Self-pay | Admitting: *Deleted

## 2016-11-19 MED FILL — OXYCODONE W/APAP 5/325 TAB: 5-325 | 4 days supply | Qty: 30 | Fill #0

## 2016-11-30 NOTE — Addendum Note (Signed)
Addended by: Neomia DearPOWERS, Sheketa Ende E on: 11/30/2016 06:26 PM   Modules accepted: Orders

## 2016-12-21 MED FILL — OXYCODONE W/APAP 5/325 TAB: 5-325 | 4 days supply | Qty: 30 | Fill #0

## 2017-01-20 ENCOUNTER — Other Ambulatory Visit: Payer: Self-pay

## 2017-01-20 DIAGNOSIS — M351 Other overlap syndromes: Secondary | ICD-10-CM

## 2017-01-20 MED ORDER — OXYCODONE-ACETAMINOPHEN 5-325 MG PO TABS: ORAL_TABLET | ORAL | 0 refills | Status: AC

## 2017-01-20 MED ORDER — OXYCODONE-ACETAMINOPHEN 5-325 MG PO TABS: ORAL_TABLET | ORAL | 0 refills | Status: DC

## 2017-01-20 NOTE — Telephone Encounter (Signed)
Requesting percocet to be filled.  

## 2017-01-20 NOTE — Telephone Encounter (Signed)
Reviewed Refill history.  [place] narcotic database appropriate.  UDS with persistent THC.  Will need to be addressed with PCP at next visit.

## 2017-01-21 NOTE — Telephone Encounter (Signed)
informed

## 2017-01-21 NOTE — Telephone Encounter (Signed)
Have tried to call with no luck

## 2017-01-22 MED FILL — OXYCODONE W/APAP 5/325 TAB: 5-325 | 4 days supply | Qty: 30 | Fill #0

## 2017-02-11 ENCOUNTER — Ambulatory Visit (INDEPENDENT_AMBULATORY_CARE_PROVIDER_SITE_OTHER): Payer: Self-pay | Admitting: Internal Medicine

## 2017-02-11 ENCOUNTER — Encounter: Payer: Self-pay | Admitting: Internal Medicine

## 2017-02-11 VITALS — BP 142/69 | HR 57 | Temp 97.8°F | Ht 64.0 in | Wt 239.9 lb

## 2017-02-11 DIAGNOSIS — Z8269 Family history of other diseases of the musculoskeletal system and connective tissue: Secondary | ICD-10-CM

## 2017-02-11 DIAGNOSIS — M351 Other overlap syndromes: Secondary | ICD-10-CM

## 2017-02-11 DIAGNOSIS — Z79891 Long term (current) use of opiate analgesic: Secondary | ICD-10-CM

## 2017-02-11 DIAGNOSIS — Z87891 Personal history of nicotine dependence: Secondary | ICD-10-CM

## 2017-02-11 NOTE — Assessment & Plan Note (Addendum)
Patient is currently taking oxycodone 5-325mg  for her joint pain. This seems to adequately control her pain and allow her to doing things throughout the day. She has never seen a rheumatologist but is open to seeing one. She has tried Plaquenil in the past but could not tolerate it. She denies a history of seizures, kidney issues, heart or lung issues. Acknowledges a facial rash, sensitivity to the sun, arthralgias, and myalgias. Family history of Bechets and Ankylosing Spondylitis.   Chart reviews negative ANA, ds-DNA, RA, CCP, and multiple other autoimmune antibodies She was only positive for RNP. She did have skin biopsies that argue against MCD.   At this point the diagnosis of MCD is questionable. If she does have MCD I do not agree with treating it with chronic opiates. We discussed the need to get her to rheumatology. [facility] but failed to fill out the require financial information. We will try to get her in again.   Plan: - [facility]erral [facility]  - Will not start immunosuppressives at this point due to the weak diagnosis of MCD

## 2017-02-11 NOTE — Progress Notes (Signed)
Internal Medicine Clinic Attending  I saw and evaluated the patient.  I personally confirmed the key portions of the history and exam documented by Dr. Helberg and I reviewed pertinent patient test results.  The assessment, diagnosis, and plan were formulated together and I agree with the documentation in the resident's note. 

## 2017-02-11 NOTE — Progress Notes (Signed)
   CC: Follow-up of MCD  HPI:  [name] is a 56 y.o. female with a PMHx significant for MCD. Please refer to problem based charting for complete evaluation and management of her MCD.   Past Medical History:  Diagnosis Date  . Arthritis    of cervical and lumbar spine  . Asthma    undiagnosed, was told by a physician in the ED, patient does not take any inhalers at home.   . Chest pain    multiple ED visit, no objective evidence of cardiac or pulmonary causes  of chest pain in past   Review of Systems:  Cardio: No palpitation, chest pain GI: No diarrhea, constipation, N/V  Physical Exam: Vitals:   02/11/17 1517  BP: (!) 142/69  Pulse: (!) 57  Temp: 97.8 F (36.6 C)  TempSrc: Oral  SpO2: 100%  Weight: 239 lb 14.4 oz (108.8 kg)  Height: 5\' 4"  (1.626 m)   Physical Exam  Constitutional: She is oriented to person, place, and time. She appears well-developed and well-nourished.  Cardiovascular: Normal rate, regular rhythm, normal heart sounds and intact distal pulses.   Pulmonary/Chest: Effort normal and breath sounds normal.  Abdominal: Soft. Bowel sounds are normal.  Musculoskeletal: Normal range of motion. She exhibits no edema, tenderness or deformity.  Neurological: She is alert and oriented to person, place, and time.  Skin:  Multiple patches of erythematous papules on the arms, chest, abdomen, and legs   Assessment & Plan:   See Encounters Tab for problem based charting.  Patient seen with Dr. Rogelia BogaButcher

## 2017-02-11 NOTE — Patient Instructions (Signed)
It was a pleasure to meet you today. Today we:  - Are setting up a [facility]   - Please complete the financial application   - I think it would be the best so that we can get you on the best medication of your skin and joint pain.   - Please come back in 3 months

## 2017-02-17 ENCOUNTER — Telehealth: Payer: Self-pay | Admitting: Internal Medicine

## 2017-02-17 NOTE — Telephone Encounter (Signed)
CALLED PATIENT, LMTCB,TIME TO RENEW GCCN CARD-DH

## 2017-02-22 MED FILL — OXYCODONE W/APAP 5/325 TAB: 5-325 | 4 days supply | Qty: 30 | Fill #0

## 2017-03-24 MED FILL — OXYCODONE W/APAP 5/325 TAB: 5-325 | 4 days supply | Qty: 30 | Fill #0

## 2017-04-26 ENCOUNTER — Telehealth: Payer: Self-pay

## 2017-04-26 NOTE — Telephone Encounter (Signed)
Requesting oxycodone to be filled. Please call pt back.  

## 2017-04-27 NOTE — Telephone Encounter (Signed)
Hello, [name] does get Oxy from us for chronic joint pain. I am unsure why it dropped off the medication list. She does need a follow-up appointment as we were attempting to get her in to a rheumatologist. Thank you

## 2017-04-28 NOTE — Telephone Encounter (Signed)
Patient needs f/u appt to address pain med (per Dr. Caron PresumeHelberg) thanks!

## 2017-05-05 ENCOUNTER — Telehealth: Payer: Self-pay | Admitting: Internal Medicine

## 2017-05-05 NOTE — Telephone Encounter (Signed)
NEEDS REFILL FOR PAIN MEDS °

## 2017-05-06 ENCOUNTER — Other Ambulatory Visit: Payer: Self-pay

## 2017-05-06 ENCOUNTER — Ambulatory Visit (HOSPITAL_COMMUNITY)
Admission: RE | Admit: 2017-05-06 | Discharge: 2017-05-06 | Disposition: A | Discharge status: Home / Self Care | Payer: Self-pay | Source: Ambulatory Visit | Attending: Internal Medicine | Admitting: Internal Medicine

## 2017-05-06 ENCOUNTER — Ambulatory Visit (INDEPENDENT_AMBULATORY_CARE_PROVIDER_SITE_OTHER): Payer: Self-pay | Admitting: Internal Medicine

## 2017-05-06 ENCOUNTER — Encounter (INDEPENDENT_AMBULATORY_CARE_PROVIDER_SITE_OTHER): Payer: Self-pay

## 2017-05-06 VITALS — BP 137/104 | HR 78 | Temp 98.2°F | Ht 64.0 in | Wt 237.7 lb

## 2017-05-06 DIAGNOSIS — R0602 Shortness of breath: Secondary | ICD-10-CM | POA: Insufficient documentation

## 2017-05-06 DIAGNOSIS — J13 Pneumonia due to Streptococcus pneumoniae: Secondary | ICD-10-CM

## 2017-05-06 DIAGNOSIS — M25579 Pain in unspecified ankle and joints of unspecified foot: Secondary | ICD-10-CM

## 2017-05-06 DIAGNOSIS — G8929 Other chronic pain: Secondary | ICD-10-CM

## 2017-05-06 DIAGNOSIS — Z79891 Long term (current) use of opiate analgesic: Secondary | ICD-10-CM

## 2017-05-06 DIAGNOSIS — R05 Cough: Secondary | ICD-10-CM | POA: Insufficient documentation

## 2017-05-06 DIAGNOSIS — R21 Rash and other nonspecific skin eruption: Secondary | ICD-10-CM

## 2017-05-06 DIAGNOSIS — J189 Pneumonia, unspecified organism: Secondary | ICD-10-CM | POA: Insufficient documentation

## 2017-05-06 DIAGNOSIS — Z8739 Personal history of other diseases of the musculoskeletal system and connective tissue: Secondary | ICD-10-CM

## 2017-05-06 DIAGNOSIS — R509 Fever, unspecified: Secondary | ICD-10-CM | POA: Insufficient documentation

## 2017-05-06 DIAGNOSIS — Z87891 Personal history of nicotine dependence: Secondary | ICD-10-CM

## 2017-05-06 DIAGNOSIS — L538 Other specified erythematous conditions: Secondary | ICD-10-CM

## 2017-05-06 MED ORDER — AMOXICILLIN-POT CLAVULANATE 875-125 MG PO TABS: 1.0000 | ORAL_TABLET | Freq: Two times a day (BID) | ORAL | 0 refills | Status: AC

## 2017-05-06 MED ORDER — OXYCODONE-ACETAMINOPHEN 5-325 MG PO TABS: 1.0000 | ORAL_TABLET | Freq: Every day | ORAL | 0 refills | Status: DC | PRN

## 2017-05-06 MED ORDER — HYDROCODONE-ACETAMINOPHEN 5-325 MG PO TABS: 1.0000 | ORAL_TABLET | Freq: Every day | ORAL | 0 refills | Status: DC | PRN

## 2017-05-06 MED FILL — OXYCODONE-ACETAMINOPHEN 5-3: 5-325 | 25 days supply | Qty: 25 | Fill #0

## 2017-05-06 MED FILL — AMOX-CLAV 875-125 MG TABLET: 875-125 | 5 days supply | Qty: 10 | Fill #0

## 2017-05-06 NOTE — Patient Instructions (Addendum)
FOLLOW-UP INSTRUCTIONS When: PCP follow up on 06/24/2016 For: Management of chronic medical conditions What to bring: Prescription medications  Thank you for seeing us in the clinic today!  You were evaluated for cough. We ordered imaging studies to look for pneumonia. I will call you with the results of these studies. You were given a prescription for antibiotics. Please take them as prescribed. If your symptoms worsen, please call the clinic for reevaluation using instructions below.  Please return to the clinic for follow up of your chronic medical conditions with PCP on 06/24/2016  If you have any questions or concerns, please call our clinic at [phone] between the hours of 9am-5pm. If you have a problem after these hours, please call [phone] and ask for the internal medicine resident on call. If you feel you are having a medical emergency please call 911.   Thanks, Dr. Jeanella FlatteryMarybeth Naijah Lacek

## 2017-05-06 NOTE — Progress Notes (Signed)
   CC: Cough x2 months  HPI:  [name] is a 56 y.o. PMH of MCD who presents for evaluation of intermittent productive cough that has occurred for past 2 months. The patient states that her symptoms started as increased nasal congestion, headache, fever, and productive cough with green sputum in October. Since then her constellation of symptoms have gradually improved to the point where her cough was intermittent and not as productive. IN the past few weeks, however, her cough and congestion have worsened. She now states that she has been coughing so much that her chest hurts and she can no longer stand it. She has been using OTC cough drops and cough syrup that have helped a little bit, but not too much. She doesn't know of anyone around her who is sick and has not yet got the flu shot this year. She denies SOB, wheezing, nausea/vomiting, and headaches.   Past Medical History: Past Medical History:  Diagnosis Date  . Arthritis    of cervical and lumbar spine  . Asthma    undiagnosed, was told by a physician in the ED, patient does not take any inhalers at home.   . Chest pain    multiple ED visit, no objective evidence of cardiac or pulmonary causes  of chest pain in past   Review of Systems:   Patient endorses intermittent productive cough, fever, as per HPI Patient denies chest pain, shortness of breath, abdominal pain, diaphoresis, nausea/vomiting, lower extremity swelling, and change in bowel/bladder habits.  Physical Exam:  Vitals:   05/06/17 1441  BP: (!) 137/104  Pulse: 78  Temp: 98.2 F (36.8 C)  TempSrc: Oral  SpO2: 97%  Weight: 237 lb 11.2 oz (107.8 kg)  Height: 5\' 4"  (1.626 m)   Physical Exam  Constitutional: She appears well-developed and well-nourished. No distress.  HENT:  Mouth/Throat: Oropharynx is clear and moist. No oropharyngeal exudate (Scar in posterior R oropharynx from previous I&D from peritonsillar abscess present).  TM without erythema or  effusions bilaterally.  Eyes: Conjunctivae and EOM are normal. Pupils are equal, round, and reactive to light.  Cardiovascular: Normal rate and intact distal pulses. Exam reveals no friction rub.  No murmur heard. Respiratory: Effort normal. No respiratory distress. She has no wheezes.  No crackles or consolidation  GI: Soft. She exhibits no distension. There is no tenderness.  Musculoskeletal: She exhibits no edema (of bilateral lower extremities) or tenderness (of bilateral lower extremities).  Lymphadenopathy:    She has no cervical adenopathy.  Skin: Skin is warm. Rash (dry skin on bilateral upper and lower extremites) noted. She is not diaphoretic. There is erythema (on upper chest and bilateral lower extremities below knees).   Assessment & Plan:   See Encounters Tab for problem based charting.  Patient seen with Dr. Heide SparkNarendra.

## 2017-05-06 NOTE — Telephone Encounter (Signed)
Dr Caron Presumehelberg I reviewed the referral section, seems she was referred but for whatever reason didn't go, this was in April, you may read the note in that referral I would suggest seeing her or calling her asap and readdress the rheumatology path again informing her that narcotics cannot continue as her only option, just a thought

## 2017-05-06 NOTE — Assessment & Plan Note (Signed)
Patient out of pain medication and needs refills. Cannot get appointment with PCP until 06/24/2017. Per chart review patient has pain contract for percocet 5-325 mg daily PRN for treatment of chronic ankle pain. Do not want patient to go into withdrawal, so prescriptions given for daily percocet PRN. (25 days left in this month, 25 days in next month until appointment with PCP, total of 50 pills given). Patient advised to keep rheumatology referral and discuss tapering of pain medication with PCP at next visit.  Plan: -Percocet 5-325 mg, 25 pills x2 months -Discuss referral to PT/Rheumatologist at next appointment

## 2017-05-06 NOTE — Assessment & Plan Note (Signed)
Patient's symptoms of productive cough, intermittent fevers, and congestion for two months are consistent with post-viral bronchitis vs community acquired pneumonia. Patient does not have maxillary or frontal tenderness consistent with sinusitis on exam. Patient will require chest imaging to evaluate for pneumonia. In the meantime she will begin empiric treatment with augmentin to cover for strep pneumoniae.   Plan: -Chest X Ray -Augmentin BID x 5 days -RTC if symptoms do not improve on therapy

## 2017-05-06 NOTE — Telephone Encounter (Signed)
Hi Helen, do you happen to know if she ever got referred to Rheumatology?

## 2017-05-07 NOTE — Telephone Encounter (Signed)
Thanks Myriam JacobsonHelen, I knew about the one in April. I thought I sent out another referral when I saw her in August. Either way I will discuss it with her. Thanks again.

## 2017-05-27 ENCOUNTER — Ambulatory Visit: Payer: Self-pay

## 2017-06-02 NOTE — Progress Notes (Signed)
Internal Medicine Clinic Attending  I saw and evaluated the patient.  I personally confirmed the key portions of the history and exam documented by Dr. Nedrud and I reviewed pertinent patient test results.  The assessment, diagnosis, and plan were formulated together and I agree with the documentation in the resident's note.  

## 2017-06-07 MED FILL — OXYCODONE-ACETAMINOPHEN 5-3: 5-325 | 25 days supply | Qty: 25 | Fill #0

## 2017-06-24 ENCOUNTER — Ambulatory Visit (INDEPENDENT_AMBULATORY_CARE_PROVIDER_SITE_OTHER): Payer: Self-pay | Admitting: Internal Medicine

## 2017-06-24 ENCOUNTER — Encounter: Payer: Self-pay | Admitting: Internal Medicine

## 2017-06-24 VITALS — BP 148/85 | HR 60 | Temp 97.8°F | Ht 64.0 in | Wt 237.0 lb

## 2017-06-24 DIAGNOSIS — E669 Obesity, unspecified: Secondary | ICD-10-CM

## 2017-06-24 DIAGNOSIS — Z79891 Long term (current) use of opiate analgesic: Secondary | ICD-10-CM

## 2017-06-24 DIAGNOSIS — M351 Other overlap syndromes: Secondary | ICD-10-CM

## 2017-06-24 DIAGNOSIS — Z6841 Body Mass Index (BMI) 40.0 and over, adult: Secondary | ICD-10-CM

## 2017-06-24 MED ORDER — OXYCODONE-ACETAMINOPHEN 5-325 MG PO TABS: 1.0000 | ORAL_TABLET | Freq: Every day | ORAL | 0 refills | Status: DC | PRN

## 2017-06-24 NOTE — Patient Instructions (Signed)
Thank you for allowing us to provide your care. Please [facility] with [facility] to get into rheumatology. I have sent out your prescriptions. Have a wonderful day.

## 2017-06-24 NOTE — Progress Notes (Signed)
   CC: Follow-up for her MCD  HPI:  [name] is a 57 y.o. with a history of MCD who presented to the clinic for continued evaluation and management of her mixed connective tissue disorder. For detailed evaluation and management please see problem based charting below.  Past Medical History:  Diagnosis Date  . Arthritis    of cervical and lumbar spine  . Asthma    undiagnosed, was told by a physician in the ED, patient does not take any inhalers at home.   . Chest pain    multiple ED visit, no objective evidence of cardiac or pulmonary causes  of chest pain in past   Review of Systems:   12 point ROS performed. All negative aside from those mentioned in the HPI.  Physical Exam: Vitals:   06/24/17 1554  BP: (!) 148/85  Pulse: 60  Temp: 97.8 F (36.6 C)  TempSrc: Oral  SpO2: 99%  Weight: 237 lb (107.5 kg)  Height: 5\' 4"  (1.626 m)   General: Obese female in no acute distress Pulm: Good air movement with no wheezing or crackles CV: Regular rate and rhythm, no murmurs, no rubs Abdomen: Active bowel sounds, soft, nondistended, no tenderness to palpation Extremities: No synovitis appreciated in the hands or feet.  No nodules noted on the knees or elbows.  No lower extremity edema. Skin: Dry, scaly skin of the bilateral lower extremities Neuro: Alert and oriented x3  Assessment & Plan:   See Encounters Tab for problem based charting.  Patient discussed with Dr. Cyndie ChimeGranfortuna

## 2017-06-25 ENCOUNTER — Other Ambulatory Visit: Payer: Self-pay | Admitting: Internal Medicine

## 2017-06-25 ENCOUNTER — Encounter: Payer: Self-pay | Admitting: Internal Medicine

## 2017-06-25 DIAGNOSIS — M351 Other overlap syndromes: Secondary | ICD-10-CM

## 2017-06-25 DIAGNOSIS — Z79891 Long term (current) use of opiate analgesic: Secondary | ICD-10-CM

## 2017-06-25 NOTE — Assessment & Plan Note (Signed)
She continues to take oxycodone 5-325 mg for her joint pain.  States that she takes 1-2 tablets/day for her pain.  States that the medication makes her pain manageable and increases her functionality.  States that some mornings she does wake up with inflamed joints in her hands.  Unfortunately she has never taken any pictures.  She has not followed up with rheumatology since we last saw each other I discussed that there may be other medications that we can try to help control her joint pain.  She states that she will communicate with [facility] to get her into a rheumatologist several referrals have been placed in the past.  She continues to endorse facial rash, arthralgias, and myalgias.  She denies any oral ulcers or other alarming signs for autoimmune disease.  We discussed that I will send out prescriptions for oxycodone; however, she needs to follow-up with rheumatology.  If she continues to miss appointments with dermatology and fails to follow-up will no longer be able to provide her with oxycodone.  She voices understanding and again relates that she will discuss getting [facility].  I will again put in a referral for rheumatology.  Plan: - Oxycodone 5-325 mg 1-2 tablets as needed for pain (3 prescriptions sent).  No refills will be given until the end of April/early May  - Urine toxicology earlier this month consistent with oxycodone use - Referral to rheumatology

## 2017-06-25 NOTE — Telephone Encounter (Signed)
HER MEDICATION REFILLS NEED TO GO TO [facility], NOT [id number]

## 2017-06-25 NOTE — Telephone Encounter (Signed)
[facility]

## 2017-06-28 MED ORDER — OXYCODONE-ACETAMINOPHEN 5-325 MG PO TABS: 1.0000 | ORAL_TABLET | Freq: Every day | ORAL | 0 refills | Status: DC | PRN

## 2017-06-28 NOTE — Telephone Encounter (Signed)
oxyCODONE-acetaminophen (PERCOCET) 5-325 MG tablet, is not at the outpatient pharmacy.

## 2017-06-28 NOTE — Progress Notes (Signed)
Medicine attending: Medical history, presenting problems, physical findings, and medications, reviewed with resident physician Dr Justin Helberg on the day of the patient visit and I concur with his evaluation and management plan. 

## 2017-06-28 NOTE — Telephone Encounter (Signed)
Pt requesting rx sent to [facility]. [facility] cost $30.00 and does not accept the orange card. Thanks

## 2017-07-02 MED FILL — OXYCODONE-ACETAMINOPHEN 5-3: 5-325 | 22 days supply | Qty: 45 | Fill #0

## 2017-07-06 ENCOUNTER — Telehealth: Payer: Self-pay | Admitting: *Deleted

## 2017-07-06 NOTE — Telephone Encounter (Signed)
Phone note opened in error - no call made at this time. Dorie RankSharon Shikara Mcauliffe, RN, 07/06/17- 9:11 P

## 2017-07-07 ENCOUNTER — Encounter: Payer: Self-pay | Admitting: Internal Medicine

## 2017-07-28 ENCOUNTER — Encounter: Payer: Self-pay | Admitting: Internal Medicine

## 2017-08-10 ENCOUNTER — Telehealth: Payer: Self-pay | Admitting: Internal Medicine

## 2017-08-10 NOTE — Telephone Encounter (Signed)
Attempted to contact patient to schedule an appointment at the request of Jasmine DecemberSharon to see Dr. Caron PresumeHelberg in [facility] this week or next.  No answer left message asking patient to call me back as soon as possible.

## 2017-08-20 ENCOUNTER — Encounter: Payer: Self-pay | Admitting: Internal Medicine

## 2017-08-20 ENCOUNTER — Other Ambulatory Visit: Payer: Self-pay | Admitting: *Deleted

## 2017-08-20 ENCOUNTER — Ambulatory Visit (INDEPENDENT_AMBULATORY_CARE_PROVIDER_SITE_OTHER): Payer: Self-pay | Admitting: Internal Medicine

## 2017-08-20 VITALS — BP 147/79 | HR 61 | Temp 97.8°F | Ht 64.0 in | Wt 236.3 lb

## 2017-08-20 DIAGNOSIS — M351 Other overlap syndromes: Secondary | ICD-10-CM

## 2017-08-20 DIAGNOSIS — Z79891 Long term (current) use of opiate analgesic: Secondary | ICD-10-CM

## 2017-08-20 MED ORDER — OXYCODONE-ACETAMINOPHEN 5-325 MG PO TABS: 1.0000 | ORAL_TABLET | Freq: Every day | ORAL | 0 refills | Status: DC | PRN

## 2017-08-20 MED FILL — OXYCODONE-ACETAMINOPHEN 5-3: 5-325 | 22 days supply | Qty: 45 | Fill #0

## 2017-08-20 NOTE — Telephone Encounter (Signed)
Pt called / stated she just left seeing her doctor and  Percocet rx was not at the pharmacy. Thanks

## 2017-08-20 NOTE — Progress Notes (Addendum)
   CC: Follow-up on her MCTD  HPI:  [name] is a 57 y.o. female who presented the clinic for continued evaluation and management for mixed connective tissue disease. For detailed evaluation and management plan please refer to problem-based charting below.  Past Medical History:  Diagnosis Date  . Arthritis    of cervical and lumbar spine  . Asthma    undiagnosed, was told by a physician in the ED, patient does not take any inhalers at home.   . Chest pain    multiple ED visit, no objective evidence of cardiac or pulmonary causes  of chest pain in past   Review of Systems:   Denies headaches, visual changes Denies CP, SOB  Physical Exam: Vitals:   08/20/17 1335  BP: (!) 147/79  Pulse: 61  Temp: 97.8 F (36.6 C)  TempSrc: Oral  SpO2: 98%  Weight: 236 lb 4.8 oz (107.2 kg)  Height: 5\' 4"  (1.626 m)   General: Well nourished female in no acute distress Pulm: Good air movement with no wheezing or crackles  CV: RRR, no murmurs, no rubs  Skin: Warm and dry   Assessment & Plan:   See Encounters Tab for problem based charting.  Patient discussed with Dr. Cyndie ChimeGranfortuna   Medicine attending: Medical history, presenting problems, physical findings, and medications, reviewed with resident physician Dr Cyndy FreezeJustin Helbergon the day of the patient visit and I concur with his evaluation and management plan.

## 2017-08-20 NOTE — Assessment & Plan Note (Signed)
Patient presented for continued evaluation and management of her mixed connective tissue disease. She continues to be on chronic opiate therapy for the management of her pain to improve her quality of life. I have placed several referrals to get her over to rheumatology because I feel this management is an appropriate and she needs to be evaluated to determine whether she actually has mixed connective tissue disease and requires biologic therapy. She is continue to avoid going to see rheumatology. She came in today so we could discussed the utility of getting her over to rheumatology. After thorough discussion she agrees to follow up with rheumatology to see if a change in medical management is warranted. She will follow-up in May. No changes made to her medical management at this point.

## 2017-08-20 NOTE — Patient Instructions (Addendum)
Thank you for allowing us to provide your care. Together we can get you living your best life. We have placed a referral for you to see rheumatology. It is important for you to see them. We have another appointment scheduled May 16th.

## 2017-09-23 ENCOUNTER — Encounter: Payer: Self-pay | Admitting: Internal Medicine

## 2017-09-27 ENCOUNTER — Other Ambulatory Visit: Payer: Self-pay | Admitting: Internal Medicine

## 2017-09-27 DIAGNOSIS — Z79891 Long term (current) use of opiate analgesic: Secondary | ICD-10-CM

## 2017-09-27 DIAGNOSIS — M351 Other overlap syndromes: Secondary | ICD-10-CM

## 2017-09-29 ENCOUNTER — Other Ambulatory Visit: Payer: Self-pay | Admitting: Internal Medicine

## 2017-09-29 DIAGNOSIS — M351 Other overlap syndromes: Secondary | ICD-10-CM

## 2017-09-29 DIAGNOSIS — Z79891 Long term (current) use of opiate analgesic: Secondary | ICD-10-CM

## 2017-09-29 MED ORDER — OXYCODONE-ACETAMINOPHEN 5-325 MG PO TABS
1.0000 | ORAL_TABLET | Freq: Every day | ORAL | 0 refills | Status: DC | PRN
Start: 2017-09-29 — End: 2017-09-29

## 2017-09-29 MED ORDER — OXYCODONE-ACETAMINOPHEN 5-325 MG PO TABS: 1.0000 | ORAL_TABLET | Freq: Every day | ORAL | 0 refills | Status: DC | PRN

## 2017-09-29 MED FILL — OXYCODONE-ACETAMINOPHEN 5-3: 5-325 | 22 days supply | Qty: 45 | Fill #0

## 2017-09-29 NOTE — Telephone Encounter (Signed)
Patient requesting refills on pain. Pls send to [facility] outpatient

## 2017-09-29 NOTE — Telephone Encounter (Signed)
Last rx written 08/20/17. Last OV 3/22. Next OV 5/16. UDS 06/10/16.

## 2017-09-29 NOTE — Telephone Encounter (Signed)
Hi Dr Caron Presume - oxycodone cannot be phone in. Thanks

## 2017-09-29 NOTE — Addendum Note (Signed)
Addended byLevora Dredge T on: 09/29/2017 12:00 PM   Modules accepted: Orders

## 2017-10-14 ENCOUNTER — Ambulatory Visit (INDEPENDENT_AMBULATORY_CARE_PROVIDER_SITE_OTHER): Payer: Self-pay | Admitting: Internal Medicine

## 2017-10-14 ENCOUNTER — Encounter: Payer: Self-pay | Admitting: Internal Medicine

## 2017-10-14 ENCOUNTER — Other Ambulatory Visit: Payer: Self-pay

## 2017-10-14 VITALS — BP 139/71 | HR 56 | Temp 98.5°F | Ht 64.0 in | Wt 229.1 lb

## 2017-10-14 DIAGNOSIS — G2581 Restless legs syndrome: Secondary | ICD-10-CM

## 2017-10-14 DIAGNOSIS — Z23 Encounter for immunization: Secondary | ICD-10-CM

## 2017-10-14 DIAGNOSIS — Z79891 Long term (current) use of opiate analgesic: Secondary | ICD-10-CM

## 2017-10-14 DIAGNOSIS — Z0000 Encounter for general adult medical examination without abnormal findings: Secondary | ICD-10-CM

## 2017-10-14 DIAGNOSIS — M351 Other overlap syndromes: Secondary | ICD-10-CM

## 2017-10-14 HISTORY — DX: Restless legs syndrome: G25.81

## 2017-10-14 NOTE — Assessment & Plan Note (Signed)
Patient voices concerns about new nocturnal leg cramping. She states that this is been going on for several weeks and primarily occurs in the evenings. When this occurs she gets up and stretches and drinks pickle juice or eats mustard. These do help to relieve her symptoms. She otherwise has been feels well.   Plan: - Check electrolytes and ferritin  - Encouraged stretching prior to bed

## 2017-10-14 NOTE — Progress Notes (Signed)
   CC: Joint Pain   HPI:  [name] is a 57 y.o. female who presented to the clinic for continued evaluation and management of her chronic medical illnesses. For a detailed evaluation and management plan please refer to problem based charting below.   Past Medical History:  Diagnosis Date  . Arthritis    of cervical and lumbar spine  . Asthma    undiagnosed, was told by a physician in the ED, patient does not take any inhalers at home.   . Chest pain    multiple ED visit, no objective evidence of cardiac or pulmonary causes  of chest pain in past   Review of Systems:   Denies chest pain, SOB  Denies dysuria, polyuria   Physical Exam: Vitals:   10/14/17 1316  BP: 139/71  Pulse: (!) 56  Temp: 98.5 F (36.9 C)  TempSrc: Oral  SpO2: 98%  Weight: 229 lb 1.6 oz (103.9 kg)  Height:  (1.626 m)   General: Well nourished female in no acute distress Pulm: Good air movement with no wheezing or crackles  CV: RRR, no murmurs, no rubs  Extremities: Pulses palpable in all extremities, no LE edema  Skin: Warm and dry   Assessment & Plan:   See Encounters Tab for problem based charting.  Patient discussed with Dr. Heide Spark

## 2017-10-14 NOTE — Assessment & Plan Note (Signed)
Checking UDS today

## 2017-10-14 NOTE — Assessment & Plan Note (Signed)
Patient presented for continued evaluation and management of her mixed connective tissue disease.   She states that the oxycodone helps her to remain functional. Most days she only takes 1 tablet but on severe days she does take 2. She tries not to take them on days she has to do a lot of driving. She continues to stay active and has lost 7 lbs over the past few weeks. She denies worsening of her rash but endorses worsening LE cramps and right sided chest pain. She denies SOB, CP.   She has not seen Rheumatology since our last visit. She states that she talked with them on the phone yesterday and was told they needed a new referral. She was told the appointment would cost $100. She will follow-up with Rheumatology.   We will continue opiate therapy at this point and send another referral to rheumatology. The patient will follow-up in 3 months.   Plan: 1. Rheumatology referral  2. Oxycodone 5/325 1-2 tablets PRN

## 2017-10-14 NOTE — Patient Instructions (Signed)
Thank you for allowing [place] to provide your care. Today we made the following changes:  1. Please call [facility] to get scheduled for your Rheumatology appointment.   2. We are checking some basic labs for your cramps.   3. Please let me know if your rib pain does not improve.    Please come back in 3 months for continued care.

## 2017-10-14 NOTE — Assessment & Plan Note (Signed)
Patient received her Tdap and hepatitis C screening today.

## 2017-10-15 LAB — BMP8+ANION GAP
ANION GAP: 14 mmol/L (ref 10.0–18.0)
BUN/Creatinine Ratio: 18 (ref 9–23)
BUN: 13 mg/dL (ref 6–24)
CALCIUM: 9.5 mg/dL (ref 8.7–10.2)
CO2: 23 mmol/L (ref 20–29)
CREATININE: 0.71 mg/dL (ref 0.57–1.00)
Chloride: 104 mmol/L (ref 96–106)
GFR calc Af Amer: 112 mL/min/{1.73_m2} (ref >59)
GFR, EST NON AFRICAN AMERICAN: 97 mL/min/{1.73_m2} (ref >59)
Glucose: 86 mg/dL (ref 65–99)
POTASSIUM: 4.3 mmol/L (ref 3.5–5.2)
Sodium: 141 mmol/L (ref 134–144)

## 2017-10-15 LAB — FERRITIN: FERRITIN: 94 ng/mL (ref 15–150)

## 2017-10-15 LAB — HEPATITIS C ANTIBODY

## 2017-10-19 NOTE — Progress Notes (Signed)
Internal Medicine Clinic Attending  Case discussed with Dr. Helberg at the time of the visit.  We reviewed the resident's history and exam and pertinent patient test results.  I agree with the assessment, diagnosis, and plan of care documented in the resident's note.    

## 2017-10-20 LAB — TOXASSURE SELECT,+ANTIDEPR,UR

## 2017-11-03 ENCOUNTER — Other Ambulatory Visit: Payer: Self-pay | Admitting: Internal Medicine

## 2017-11-03 DIAGNOSIS — M351 Other overlap syndromes: Secondary | ICD-10-CM

## 2017-11-03 DIAGNOSIS — Z79891 Long term (current) use of opiate analgesic: Secondary | ICD-10-CM

## 2017-11-03 NOTE — Telephone Encounter (Signed)
Patient is requesting refill for pain medicine, [facility]

## 2017-11-04 MED ORDER — OXYCODONE-ACETAMINOPHEN 5-325 MG PO TABS: 1.0000 | ORAL_TABLET | Freq: Every day | ORAL | 0 refills | Status: DC | PRN

## 2017-11-04 MED FILL — OXYCODONE-ACETAMINOPHEN 5-3: 5-325 | 22 days supply | Qty: 45 | Fill #0

## 2017-11-12 ENCOUNTER — Ambulatory Visit: Payer: Self-pay

## 2017-12-07 ENCOUNTER — Other Ambulatory Visit: Payer: Self-pay | Admitting: Internal Medicine

## 2017-12-07 ENCOUNTER — Ambulatory Visit: Payer: Self-pay

## 2017-12-07 DIAGNOSIS — Z79891 Long term (current) use of opiate analgesic: Secondary | ICD-10-CM

## 2017-12-07 DIAGNOSIS — M351 Other overlap syndromes: Secondary | ICD-10-CM

## 2017-12-07 NOTE — Telephone Encounter (Signed)
Needs refill on  oxyCODONE-acetaminophen (PERCOCET) 5-325 MG tablet at   [facility] [place], [place] [phone] (Phone) [phone] (Fax)

## 2017-12-08 MED ORDER — OXYCODONE-ACETAMINOPHEN 5-325 MG PO TABS: 1.0000 | ORAL_TABLET | Freq: Every day | ORAL | 0 refills | Status: DC | PRN

## 2017-12-08 MED FILL — OXYCODONE-ACETAMINOPHEN 5-3: 5-325 | 22 days supply | Qty: 45 | Fill #0

## 2018-01-05 ENCOUNTER — Ambulatory Visit: Payer: Self-pay

## 2018-01-10 ENCOUNTER — Other Ambulatory Visit: Payer: Self-pay | Admitting: Internal Medicine

## 2018-01-10 DIAGNOSIS — M351 Other overlap syndromes: Secondary | ICD-10-CM

## 2018-01-10 DIAGNOSIS — Z79891 Long term (current) use of opiate analgesic: Secondary | ICD-10-CM

## 2018-01-10 NOTE — Telephone Encounter (Signed)
Need refill on oxyCODONE-acetaminophen (PERCOCET) 5-325 MG tablet, @ [facility] [phone]; pt contact# [phone]

## 2018-01-11 MED ORDER — OXYCODONE-ACETAMINOPHEN 5-325 MG PO TABS: 1.0000 | ORAL_TABLET | Freq: Every day | ORAL | 0 refills | Status: DC | PRN

## 2018-01-11 MED FILL — OXYCODONE-ACETAMINOPHEN 5-3: 5-325 | 23 days supply | Qty: 45 | Fill #0

## 2018-01-13 ENCOUNTER — Encounter: Payer: Self-pay | Admitting: Internal Medicine

## 2018-02-15 ENCOUNTER — Other Ambulatory Visit: Payer: Self-pay

## 2018-02-15 DIAGNOSIS — M351 Other overlap syndromes: Secondary | ICD-10-CM

## 2018-02-15 DIAGNOSIS — Z79891 Long term (current) use of opiate analgesic: Secondary | ICD-10-CM

## 2018-02-15 NOTE — Telephone Encounter (Signed)
oxyCODONE-acetaminophen (PERCOCET) 5-325 MG tablet, refill request @ [place] [facility].

## 2018-02-17 MED ORDER — OXYCODONE-ACETAMINOPHEN 5-325 MG PO TABS
1.0000 | ORAL_TABLET | Freq: Every day | ORAL | 0 refills | Status: DC | PRN
Start: 2018-02-17 — End: 2018-03-22

## 2018-02-17 MED FILL — OXYCODONE-ACETAMINOPHEN 5-3: 5-325 | 22 days supply | Qty: 45 | Fill #0

## 2018-02-17 NOTE — Telephone Encounter (Signed)
Called pt at both #'s lm for rtc on home #, appt 11/7

## 2018-02-17 NOTE — Telephone Encounter (Signed)
Patient has cancelled last 3 appointments. Needs appointment ASAP. Please relay that this is the last prescription that will be sent out if she continues to cancel her appointments.

## 2018-02-18 ENCOUNTER — Telehealth: Payer: Self-pay | Admitting: Internal Medicine

## 2018-02-18 NOTE — Telephone Encounter (Signed)
Called pt - informed she needs to keep her appts; stated she has a lot going on in her life right now. She has an appt 11/7 - Dr Caron PresumeHelberg this is your first available; do u want her to see someone else [facility] Thanks

## 2018-02-18 NOTE — Telephone Encounter (Signed)
Rx has been refilled . See 9/17 telephone encounter.

## 2018-02-18 NOTE — Telephone Encounter (Signed)
Returning call about her medication refill.  Patient has sch an appt for 04/17/2018 with her PCP Dr. Caron PresumeHelberg.

## 2018-02-21 NOTE — Telephone Encounter (Signed)
No I need to see the patient. Thank you though.

## 2018-03-22 ENCOUNTER — Other Ambulatory Visit: Payer: Self-pay | Admitting: Internal Medicine

## 2018-03-22 DIAGNOSIS — M351 Other overlap syndromes: Secondary | ICD-10-CM

## 2018-03-22 DIAGNOSIS — Z79891 Long term (current) use of opiate analgesic: Secondary | ICD-10-CM

## 2018-03-22 MED FILL — OXYCODONE-ACETAMINOPHEN 5-3: 5-325 | 23 days supply | Qty: 45 | Fill #0

## 2018-04-07 ENCOUNTER — Other Ambulatory Visit: Payer: Self-pay

## 2018-04-07 ENCOUNTER — Encounter: Payer: Self-pay | Admitting: Internal Medicine

## 2018-04-07 ENCOUNTER — Ambulatory Visit (INDEPENDENT_AMBULATORY_CARE_PROVIDER_SITE_OTHER): Payer: Self-pay | Admitting: Internal Medicine

## 2018-04-07 VITALS — BP 163/84 | HR 60 | Temp 97.9°F | Ht 64.0 in | Wt 217.4 lb

## 2018-04-07 DIAGNOSIS — Z79891 Long term (current) use of opiate analgesic: Secondary | ICD-10-CM

## 2018-04-07 DIAGNOSIS — G8929 Other chronic pain: Secondary | ICD-10-CM

## 2018-04-07 NOTE — Patient Instructions (Addendum)
Thank you for allowing [place] to provide your care. Today we are not making any changes to your medications. If you feel your cold is getting worse please call me back and we can discuss antibiotic therapy. Please come back in 3 months for continued evaluation.

## 2018-04-10 ENCOUNTER — Encounter: Payer: Self-pay | Admitting: Internal Medicine

## 2018-04-10 NOTE — Progress Notes (Signed)
   CC: Chronic Pain  HPI:  Sonya Smith is a 57 y.o. female who presented to the clinic for continued evaluation and management of her chronic medical illnesses. For a detailed assessment and plan please refer to problem based charting below.   Past Medical History:  Diagnosis Date  . Arthritis    of cervical and lumbar spine  . Asthma    undiagnosed, was told by a physician in the ED, patient does not take any inhalers at home.   . Chest pain    multiple ED visit, no objective evidence of cardiac or pulmonary causes  of chest pain in past   Review of Systems:  12 point ROS preformed. All negative aside from those mentioned in the HPI.  Physical Exam: Vitals:   04/07/18 1538  BP: (!) 163/84  Pulse: 60  Temp: 97.9 F (36.6 C)  TempSrc: Oral  SpO2: 98%  Weight: 217 lb 6.4 oz (98.6 kg)  Height: 5\' 4"  (1.626 m)   General: Well nourished female in no acute distress Pulm: Good air movement with no wheezing or crackles  CV: RRR, no murmurs, no rubs  Abdomen: Active bowel sounds, soft, non-distended, no tenderness to palpation   Assessment & Plan:   See Encounters Tab for problem based charting.  Patient discussed with Dr. Cyndie Chime

## 2018-04-10 NOTE — Assessment & Plan Note (Signed)
Patient presents for continued evaluation of her chronic pain. She is on Oxycodone 5mg  1-2 tablets daily (#45). Her pain is fairly well controlled with this regimen and allows her to function in her ADLs. She does supplement her pain medications with marijuana. Database reviewed and appropriate. Obtaining UDS today. Plan for continued close follow-up every 3 months.

## 2018-04-11 NOTE — Progress Notes (Signed)
Medicine attending: Medical history, presenting problems, physical findings, and medications, reviewed with resident physician Dr Justin Helberg on the day of the patient visit and I concur with his evaluation and management plan. 

## 2018-04-13 LAB — TOXASSURE SELECT,+ANTIDEPR,UR

## 2018-04-25 ENCOUNTER — Other Ambulatory Visit: Payer: Self-pay

## 2018-04-25 DIAGNOSIS — Z79891 Long term (current) use of opiate analgesic: Secondary | ICD-10-CM

## 2018-04-25 DIAGNOSIS — M351 Other overlap syndromes: Secondary | ICD-10-CM

## 2018-04-25 MED ORDER — OXYCODONE-ACETAMINOPHEN 5-325 MG PO TABS: 1.0000 | ORAL_TABLET | Freq: Every day | ORAL | 0 refills | Status: DC | PRN

## 2018-04-25 MED FILL — OXYCODONE-ACETAMINOPHEN 5-3: 5-325 | 23 days supply | Qty: 45 | Fill #0

## 2018-04-25 NOTE — Telephone Encounter (Signed)
oxyCODONE-acetaminophen (PERCOCET/ROXICET) 5-325 MG tablet  Refill request @  [facility] [place], [place] - [address] [phone] (Phone) [phone] (Fax)

## 2018-04-25 NOTE — Telephone Encounter (Signed)
Last rx written 03/22/18. Last OV 04/07/18. Next OV No f/u appt has been scheduled yet. UDS 04/07/18.

## 2018-05-26 ENCOUNTER — Other Ambulatory Visit: Payer: Self-pay | Admitting: Internal Medicine

## 2018-05-26 DIAGNOSIS — M351 Other overlap syndromes: Secondary | ICD-10-CM

## 2018-05-26 DIAGNOSIS — Z79891 Long term (current) use of opiate analgesic: Secondary | ICD-10-CM

## 2018-05-26 MED FILL — OXYCODONE-ACETAMINOPHEN 5-3: 5-325 | 23 days supply | Qty: 45 | Fill #0

## 2018-06-14 ENCOUNTER — Encounter: Payer: Self-pay | Admitting: Internal Medicine

## 2018-06-27 ENCOUNTER — Other Ambulatory Visit: Payer: Self-pay | Admitting: Internal Medicine

## 2018-06-27 DIAGNOSIS — M351 Other overlap syndromes: Secondary | ICD-10-CM

## 2018-06-27 DIAGNOSIS — Z79891 Long term (current) use of opiate analgesic: Secondary | ICD-10-CM

## 2018-06-27 NOTE — Telephone Encounter (Signed)
Needs refill on oxyCODONE-acetaminophen (PERCOCET/ROXICET) 5-325 MG tablet at [facility] - [place], [place] - [address] [facility] pt contact [phone]

## 2018-06-28 MED ORDER — OXYCODONE-ACETAMINOPHEN 5-325 MG PO TABS: 1.0000 | ORAL_TABLET | Freq: Every day | ORAL | 0 refills | Status: DC | PRN

## 2018-06-28 MED FILL — OXYCODONE-ACETAMINOPHEN 5-3: 5-325 | 23 days supply | Qty: 45 | Fill #0

## 2018-07-14 ENCOUNTER — Encounter: Payer: Self-pay | Admitting: Internal Medicine

## 2018-07-14 ENCOUNTER — Ambulatory Visit (INDEPENDENT_AMBULATORY_CARE_PROVIDER_SITE_OTHER): Payer: Self-pay | Admitting: Internal Medicine

## 2018-07-14 VITALS — BP 137/79 | HR 55 | Temp 98.2°F | Ht 64.0 in | Wt 229.4 lb

## 2018-07-14 DIAGNOSIS — Z0000 Encounter for general adult medical examination without abnormal findings: Secondary | ICD-10-CM

## 2018-07-14 DIAGNOSIS — M351 Other overlap syndromes: Secondary | ICD-10-CM

## 2018-07-14 DIAGNOSIS — R233 Spontaneous ecchymoses: Secondary | ICD-10-CM

## 2018-07-14 DIAGNOSIS — Z79891 Long term (current) use of opiate analgesic: Secondary | ICD-10-CM

## 2018-07-14 LAB — CBC WITH DIFFERENTIAL/PLATELET
Abs Immature Granulocytes: 0.02 10*3/uL (ref 0.00–0.07)
BASOS ABS: 0.1 10*3/uL (ref 0.0–0.1)
BASOS PCT: 1 %
Eosinophils Absolute: 0.2 10*3/uL (ref 0.0–0.5)
Eosinophils Relative: 2 %
HCT: 43.5 % (ref 36.0–46.0)
Hemoglobin: 14 g/dL (ref 12.0–15.0)
Immature Granulocytes: 0 %
Lymphocytes Relative: 30 %
Lymphs Abs: 2.4 10*3/uL (ref 0.7–4.0)
MCH: 28.5 pg (ref 26.0–34.0)
MCHC: 32.2 g/dL (ref 30.0–36.0)
MCV: 88.6 fL (ref 80.0–100.0)
Monocytes Absolute: 0.6 10*3/uL (ref 0.1–1.0)
Monocytes Relative: 8 %
NRBC: 0 % (ref 0.0–0.2)
Neutro Abs: 4.8 10*3/uL (ref 1.7–7.7)
Neutrophils Relative %: 59 %
Platelets: 188 10*3/uL (ref 150–400)
RBC: 4.91 MIL/uL (ref 3.87–5.11)
RDW: 13.2 % (ref 11.5–15.5)
WBC: 8 10*3/uL (ref 4.0–10.5)

## 2018-07-14 LAB — APTT: APTT: 32 s (ref 24–36)

## 2018-07-14 LAB — PROTIME-INR
INR: 0.96
Prothrombin Time: 12.7 seconds (ref 11.4–15.2)

## 2018-07-14 NOTE — Patient Instructions (Addendum)
Thank you for allowing me to provide your care. I have sent out a prescription for your medications please continue them as prescribed. I will send out another referral to Rheumatology. Please schedule this appointment as soon as possible.   Please schedule a follow-up in 3 months for a PAP smear.

## 2018-07-14 NOTE — Assessment & Plan Note (Signed)
Drug database checked today. She is getting 45 tablets of Oxycodone 5-325 every 30 days. She has not had any outside prescriptions. Last UDS was consistent. Will send out another 3 months. Repeat UDS at each visit every 3 months.

## 2018-07-14 NOTE — Progress Notes (Signed)
   CC: F/U for chronic opiate use.  HPI:  [name] is a 58 y.o. female with PMHx listed below presenting for F/U for chronic opiate use. Please see the A&P for the status of the patient's chronic medical problems.  Past Medical History:  Diagnosis Date  . Arthritis    of cervical and lumbar spine  . Asthma    undiagnosed, was told by a physician in the ED, patient does not take any inhalers at home.   . Chest pain    multiple ED visit, no objective evidence of cardiac or pulmonary causes  of chest pain in past   Review of Systems:  Performed and all others negative.  Physical Exam: Vitals:   07/14/18 1325  BP: 137/79  Pulse: (!) 55  Temp: 98.2 F (36.8 C)  TempSrc: Oral  Weight: 229 lb 6.4 oz (104.1 kg)   General: Well nourished female in no acute distress Pulm: Good air movement with no wheezing or crackles  CV: RRR, no murmurs, no rubs   Assessment & Plan:   See Encounters Tab for problem based charting.  Patient discussed with Dr. Oswaldo Done

## 2018-07-14 NOTE — Assessment & Plan Note (Signed)
HPI: Petechial rash that occurred after getting her BP taken. No painful. On the dorsum of the left hand.   A/P: Check CBC w/diff, PT, PTT.

## 2018-07-14 NOTE — Assessment & Plan Note (Signed)
HPI: Patient presented for continued evaluation and management of her MCD. She is currently on Oxycodone for treatment of her arthritic pain. She states that the medication helps her to stay functional and makes the pain bearable. She takes 1-2 tablets per day. When the weather is bad her pain is worse. She is otherwise doing well. No new rashes, ulcers, myalgias, hematuria, SOB, CP, cough.   A/P: Drug database checked today. She is getting 45 tablets of Oxycodone 5-325 every 30 days. She has not had any outside prescriptions. Last UDS was consistent. Will send out another 3 months. Repeat UDS at each visit every 3 months.

## 2018-07-14 NOTE — Assessment & Plan Note (Signed)
Patient needs influenza vaccine, PAP, and Mammogram. Referral for mammogram sent. She will follow-up in 3 months for her PAP.

## 2018-07-15 NOTE — Progress Notes (Signed)
Internal Medicine Clinic Attending  Case discussed with Dr. Helberg at the time of the visit.  We reviewed the resident's history and exam and pertinent patient test results.  I agree with the assessment, diagnosis, and plan of care documented in the resident's note.    

## 2018-07-19 LAB — TOXASSURE SELECT,+ANTIDEPR,UR

## 2018-08-01 ENCOUNTER — Other Ambulatory Visit: Payer: Self-pay | Admitting: Internal Medicine

## 2018-08-01 DIAGNOSIS — M351 Other overlap syndromes: Secondary | ICD-10-CM

## 2018-08-01 DIAGNOSIS — Z79891 Long term (current) use of opiate analgesic: Secondary | ICD-10-CM

## 2018-08-01 MED ORDER — OXYCODONE-ACETAMINOPHEN 5-325 MG PO TABS: 1.0000 | ORAL_TABLET | Freq: Every day | ORAL | 0 refills | Status: DC | PRN

## 2018-08-01 NOTE — Telephone Encounter (Signed)
Call made to pharmacy-no rx on file, will send to pcp for review.Kingsley Spittle Cassady3/2/202011:48 AM

## 2018-08-01 NOTE — Telephone Encounter (Signed)
Needs refill on oxyCODONE-acetaminophen (PERCOCET/ROXICET) 5-325 MG tablet  [facility] - [place], [place] - [address] [facility]. ;pt contact [phone]

## 2018-08-02 MED FILL — OXYCODONE-ACETAMINOPHEN 5-3: 5-325 | 23 days supply | Qty: 45 | Fill #0

## 2018-08-17 ENCOUNTER — Ambulatory Visit: Payer: Self-pay

## 2018-09-02 ENCOUNTER — Other Ambulatory Visit: Payer: Self-pay | Admitting: Internal Medicine

## 2018-09-02 DIAGNOSIS — M351 Other overlap syndromes: Secondary | ICD-10-CM

## 2018-09-02 DIAGNOSIS — Z79891 Long term (current) use of opiate analgesic: Secondary | ICD-10-CM

## 2018-09-02 NOTE — Telephone Encounter (Signed)
Last rx written 3/2. Last OV 2/13. Next OV  No appt has been schedule. UDS 07/14/18.

## 2018-09-02 NOTE — Telephone Encounter (Signed)
Refill Request - Pt aware that the [facility] will be used instead of [facility]   oxyCODONE-acetaminophen (PERCOCET/ROXICET) 5-325 MG tablet

## 2018-09-05 ENCOUNTER — Other Ambulatory Visit: Payer: Self-pay | Admitting: Internal Medicine

## 2018-09-05 DIAGNOSIS — Z79891 Long term (current) use of opiate analgesic: Secondary | ICD-10-CM

## 2018-09-05 DIAGNOSIS — M351 Other overlap syndromes: Secondary | ICD-10-CM

## 2018-09-05 MED ORDER — OXYCODONE-ACETAMINOPHEN 5-325 MG PO TABS: 1.0000 | ORAL_TABLET | Freq: Every day | ORAL | 0 refills | Status: DC | PRN

## 2018-09-05 NOTE — Telephone Encounter (Signed)
Pt is calling regarding medicine for her pain medicine

## 2018-09-06 ENCOUNTER — Other Ambulatory Visit: Payer: Self-pay | Admitting: Internal Medicine

## 2018-09-06 ENCOUNTER — Telehealth: Payer: Self-pay | Admitting: *Deleted

## 2018-09-06 DIAGNOSIS — M351 Other overlap syndromes: Secondary | ICD-10-CM

## 2018-09-06 DIAGNOSIS — Z79891 Long term (current) use of opiate analgesic: Secondary | ICD-10-CM

## 2018-09-06 MED ORDER — OXYCODONE-ACETAMINOPHEN 5-325 MG PO TABS: 1.0000 | ORAL_TABLET | Freq: Every day | ORAL | 0 refills | Status: DC | PRN

## 2018-09-06 MED FILL — OXYCODONE-ACETAMINOPHEN 5-3: 5-325 | 23 days supply | Qty: 45 | Fill #0

## 2018-09-06 NOTE — Telephone Encounter (Signed)
Dr Caron Presume, please send the ox[facility] script again [facility], they have cancelled the first out byut need a new one sent to [facility]

## 2018-09-06 NOTE — Progress Notes (Signed)
Refill for oxycodone sent to [facility].

## 2018-10-06 ENCOUNTER — Other Ambulatory Visit: Payer: Self-pay

## 2018-10-06 DIAGNOSIS — Z79891 Long term (current) use of opiate analgesic: Secondary | ICD-10-CM

## 2018-10-06 DIAGNOSIS — M351 Other overlap syndromes: Secondary | ICD-10-CM

## 2018-10-06 MED ORDER — OXYCODONE-ACETAMINOPHEN 5-325 MG PO TABS: 1.0000 | ORAL_TABLET | Freq: Every day | ORAL | 0 refills | Status: DC | PRN

## 2018-10-06 NOTE — Telephone Encounter (Signed)
Last rx written 09/06/18. Last OV  07/14/18. Next OV has not been scheduled. UDS  07/14/18

## 2018-10-06 NOTE — Telephone Encounter (Signed)
oxyCODONE-acetaminophen (PERCOCET/ROXICET) 5-325 MG tablet   REFILL REQUEST @  [facility] - [place], [place] - [address] [place] [phone] (Phone) [phone] (Fax)

## 2018-10-07 ENCOUNTER — Other Ambulatory Visit: Payer: Self-pay | Admitting: Internal Medicine

## 2018-10-07 DIAGNOSIS — M351 Other overlap syndromes: Secondary | ICD-10-CM

## 2018-10-07 DIAGNOSIS — Z79891 Long term (current) use of opiate analgesic: Secondary | ICD-10-CM

## 2018-10-10 ENCOUNTER — Telehealth: Payer: Self-pay | Admitting: *Deleted

## 2018-10-10 DIAGNOSIS — Z79891 Long term (current) use of opiate analgesic: Secondary | ICD-10-CM

## 2018-10-10 DIAGNOSIS — M351 Other overlap syndromes: Secondary | ICD-10-CM

## 2018-10-10 MED ORDER — OXYCODONE-ACETAMINOPHEN 5-325 MG PO TABS: 1.0000 | ORAL_TABLET | Freq: Every day | ORAL | 0 refills | Status: DC | PRN

## 2018-10-10 MED FILL — OXYCODONE-ACETAMINOPHEN 5-3: 5-325 | 22 days supply | Qty: 45 | Fill #0

## 2018-10-10 NOTE — Telephone Encounter (Signed)
The script sent 5/7 for oxy needs to be redone and sent to [facility], they cannot transfer sending new request

## 2018-10-19 ENCOUNTER — Ambulatory Visit: Payer: Self-pay

## 2018-11-03 ENCOUNTER — Emergency Department (HOSPITAL_COMMUNITY): Payer: No Typology Code available for payment source

## 2018-11-03 ENCOUNTER — Emergency Department (HOSPITAL_COMMUNITY)
Admission: EM | Admit: 2018-11-03 | Discharge: 2018-11-03 | Disposition: A | Discharge status: Home / Self Care | Payer: No Typology Code available for payment source | Attending: Emergency Medicine | Admitting: Emergency Medicine

## 2018-11-03 ENCOUNTER — Other Ambulatory Visit: Payer: Self-pay

## 2018-11-03 ENCOUNTER — Encounter (HOSPITAL_COMMUNITY): Payer: Self-pay

## 2018-11-03 DIAGNOSIS — Z79899 Other long term (current) drug therapy: Secondary | ICD-10-CM | POA: Insufficient documentation

## 2018-11-03 DIAGNOSIS — Y92410 Unspecified street and highway as the place of occurrence of the external cause: Secondary | ICD-10-CM | POA: Insufficient documentation

## 2018-11-03 DIAGNOSIS — Z87891 Personal history of nicotine dependence: Secondary | ICD-10-CM | POA: Insufficient documentation

## 2018-11-03 DIAGNOSIS — Y999 Unspecified external cause status: Secondary | ICD-10-CM | POA: Insufficient documentation

## 2018-11-03 DIAGNOSIS — Y9389 Activity, other specified: Secondary | ICD-10-CM | POA: Diagnosis not present

## 2018-11-03 DIAGNOSIS — Z7982 Long term (current) use of aspirin: Secondary | ICD-10-CM | POA: Diagnosis not present

## 2018-11-03 DIAGNOSIS — S161XXA Strain of muscle, fascia and tendon at neck level, initial encounter: Secondary | ICD-10-CM

## 2018-11-03 DIAGNOSIS — S199XXA Unspecified injury of neck, initial encounter: Secondary | ICD-10-CM | POA: Diagnosis present

## 2018-11-03 MED ORDER — OXYCODONE-ACETAMINOPHEN 5-325 MG PO TABS
1.0000 | ORAL_TABLET | Freq: Once | ORAL | Status: AC
  Administered 2018-11-03: 1 via ORAL
  Filled 2018-11-03: qty 1
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
## 2018-11-03 NOTE — ED Notes (Signed)
Hard collar removed with MD permission. Pt informed that MD would be in to talk to her shortly

## 2018-11-03 NOTE — ED Notes (Signed)
Patient transported to CT 

## 2018-11-10 ENCOUNTER — Other Ambulatory Visit: Payer: Self-pay | Admitting: Internal Medicine

## 2018-11-10 DIAGNOSIS — Z79891 Long term (current) use of opiate analgesic: Secondary | ICD-10-CM

## 2018-11-10 DIAGNOSIS — M351 Other overlap syndromes: Secondary | ICD-10-CM

## 2018-11-10 MED ORDER — OXYCODONE-ACETAMINOPHEN 5-325 MG PO TABS: 1.0000 | ORAL_TABLET | Freq: Every day | ORAL | 0 refills | Status: DC | PRN

## 2018-11-10 NOTE — Telephone Encounter (Signed)
Needs refill on oxyCODONE-acetaminophen (PERCOCET/ROXICET) 5-325 MG tablet  ;pt contact [phone]   [facility] - [place], [place] - [address] 

## 2018-11-11 MED FILL — OXYCODONE-ACETAMINOPHEN 5-3: 5-325 | 22 days supply | Qty: 45 | Fill #0

## 2018-12-09 ENCOUNTER — Other Ambulatory Visit: Payer: Self-pay | Admitting: Internal Medicine

## 2018-12-09 DIAGNOSIS — M351 Other overlap syndromes: Secondary | ICD-10-CM

## 2018-12-09 DIAGNOSIS — Z79891 Long term (current) use of opiate analgesic: Secondary | ICD-10-CM

## 2018-12-09 NOTE — Telephone Encounter (Signed)
Needs refill on  oxyCODONE-acetaminophen (PERCOCET/ROXICET) 5-325 MG tablet   Sonya Smith, Sonya Smith     ;pt contact [phone]

## 2018-12-12 MED ORDER — OXYCODONE-ACETAMINOPHEN 5-325 MG PO TABS: 1.0000 | ORAL_TABLET | Freq: Every day | ORAL | 0 refills | Status: DC | PRN

## 2018-12-12 MED FILL — OXYCODONE-ACETAMINOPHEN 5-3: 5-325 | 22 days supply | Qty: 45 | Fill #0

## 2018-12-12 NOTE — Telephone Encounter (Signed)
Pt is calling regarding medicine refill  [phone]  pls contact pt

## 2018-12-15 ENCOUNTER — Encounter: Payer: Self-pay | Admitting: Internal Medicine

## 2018-12-15 ENCOUNTER — Other Ambulatory Visit: Payer: Self-pay

## 2018-12-15 ENCOUNTER — Ambulatory Visit (INDEPENDENT_AMBULATORY_CARE_PROVIDER_SITE_OTHER): Payer: Self-pay | Admitting: Internal Medicine

## 2018-12-15 VITALS — BP 146/85 | HR 85 | Temp 98.0°F | Ht 60.4 in | Wt 237.5 lb

## 2018-12-15 DIAGNOSIS — M2550 Pain in unspecified joint: Secondary | ICD-10-CM

## 2018-12-15 DIAGNOSIS — Z872 Personal history of diseases of the skin and subcutaneous tissue: Secondary | ICD-10-CM

## 2018-12-15 DIAGNOSIS — Z79891 Long term (current) use of opiate analgesic: Secondary | ICD-10-CM

## 2018-12-15 DIAGNOSIS — M791 Myalgia, unspecified site: Secondary | ICD-10-CM

## 2018-12-15 DIAGNOSIS — R233 Spontaneous ecchymoses: Secondary | ICD-10-CM

## 2018-12-15 NOTE — Progress Notes (Signed)
   CC: F/u for use of chronic opiate medication for therapeutic purposes.   HPI:  [name] is a 58 y.o. F with PMHx listed below presenting for F/u for use of chronic opiate medication for therapeutic purposes. Please see the A&P for the status of the patient's chronic medical problems.  Past Medical History:  Diagnosis Date  . Anxiety 10/09/2010  . Arthritis    of cervical and lumbar spine  . Asthma    undiagnosed, was told by a physician in the ED, patient does not take any inhalers at home.   . Chest pain    multiple ED visit, no objective evidence of cardiac or pulmonary causes  of chest pain in past  . Restless leg syndrome 10/14/2017   Review of Systems:  Performed and all others negative.  Physical Exam: Vitals:   12/15/18 1356  BP: (!) 146/85  Pulse: 85  Temp: 98 F (36.7 C)  TempSrc: Oral  SpO2: 98%  Weight: 237 lb 8 oz (107.7 kg)  Height: 5' 0.4" (1.534 m)   General: Well nourished female in no acute distress Pulm: Good air movement with no wheezing or crackles  CV: RRR, no murmurs, no rubs   Assessment & Plan:   See Encounters Tab for problem based charting.  Patient discussed with Dr. Rebeca Alert
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
## 2018-12-15 NOTE — Assessment & Plan Note (Addendum)
Chronic arthralgias and myalgias. Currently on Oxycodone 5/325 1-2 tablets per day (#45). She uses these medication to stay function. Drug database reviewed. Only getting prescriptions from our clinic. Last UDS 2/13 was appropriate. No red flag symptoms. No orange flag symptoms.   A/P: - Continue Oxycodone 5/325 1-2 tablets per day (#45) - Urine tox today

## 2018-12-15 NOTE — Assessment & Plan Note (Signed)
No further petechial rashes. PT/aPTT and platelet count normal at last visit. 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 Needs to see rheumatology.

## 2018-12-16 NOTE — Progress Notes (Signed)
Internal Medicine Clinic Attending  Case discussed with Dr. Helberg at the time of the visit.  We reviewed the resident's history and exam and pertinent patient test results.  I agree with the assessment, diagnosis, and plan of care documented in the resident's note.  Alexander Raines, M.D., Ph.D.  

## 2018-12-19 LAB — TOXASSURE SELECT,+ANTIDEPR,UR

## 2019-01-04 ENCOUNTER — Ambulatory Visit: Payer: Self-pay

## 2019-01-12 ENCOUNTER — Other Ambulatory Visit: Payer: Self-pay

## 2019-01-12 DIAGNOSIS — Z79891 Long term (current) use of opiate analgesic: Secondary | ICD-10-CM

## 2019-01-12 DIAGNOSIS — M351 Other overlap syndromes: Secondary | ICD-10-CM

## 2019-01-12 NOTE — Telephone Encounter (Signed)
oxyCODONE-acetaminophen (PERCOCET/ROXICET) 5-325 MG tablet     Refill request @  [facility] - [place], [place] - [address] [phone] (Phone) [phone] (Fax)    

## 2019-01-16 MED ORDER — OXYCODONE-ACETAMINOPHEN 5-325 MG PO TABS: 1.0000 | ORAL_TABLET | Freq: Every day | ORAL | 0 refills | Status: DC | PRN

## 2019-01-16 MED FILL — OXYCODONE-ACETAMINOPHEN 5-3: 5-325 | 23 days supply | Qty: 45 | Fill #0

## 2019-01-16 NOTE — Telephone Encounter (Signed)
Pt calling back to check if oxycodone is ready. Please call pt back.

## 2019-02-13 ENCOUNTER — Other Ambulatory Visit: Payer: Self-pay | Admitting: Internal Medicine

## 2019-02-13 DIAGNOSIS — M351 Other overlap syndromes: Secondary | ICD-10-CM

## 2019-02-13 DIAGNOSIS — Z79891 Long term (current) use of opiate analgesic: Secondary | ICD-10-CM

## 2019-02-13 MED ORDER — OXYCODONE-ACETAMINOPHEN 5-325 MG PO TABS: 1.0000 | ORAL_TABLET | Freq: Every day | ORAL | 0 refills | Status: DC | PRN

## 2019-02-13 MED FILL — OXYCODONE-ACETAMINOPHEN 5-3: 5-325 | 23 days supply | Qty: 45 | Fill #0

## 2019-02-13 NOTE — Telephone Encounter (Signed)
Needs refill on oxyCODONE-acetaminophen (PERCOCET/ROXICET) 5-325 MG tablet  ;pt contact [phone]   [facility] - [place], [place] - [address] 

## 2019-02-13 NOTE — Telephone Encounter (Signed)
Last rx written/17/20. Last OV  12/15/18. Next OV  03/16/19. UDS  12/15/18.

## 2019-03-13 ENCOUNTER — Other Ambulatory Visit: Payer: Self-pay

## 2019-03-13 DIAGNOSIS — M351 Other overlap syndromes: Secondary | ICD-10-CM

## 2019-03-13 DIAGNOSIS — Z79891 Long term (current) use of opiate analgesic: Secondary | ICD-10-CM

## 2019-03-13 MED ORDER — OXYCODONE-ACETAMINOPHEN 5-325 MG PO TABS: 1.0000 | ORAL_TABLET | Freq: Every day | ORAL | 0 refills | Status: DC | PRN

## 2019-03-13 MED FILL — OXYCODONE-ACETAMINOPHEN 5-3: 5-325 | 23 days supply | Qty: 45 | Fill #0

## 2019-03-13 NOTE — Telephone Encounter (Signed)
oxyCODONE-acetaminophen (PERCOCET/ROXICET) 5-325 MG tablet, refill request @  [place], [place] - [place] [phone] (Phone) [phone] (Fax)

## 2019-03-16 ENCOUNTER — Encounter: Payer: Self-pay | Admitting: Internal Medicine

## 2019-04-06 ENCOUNTER — Other Ambulatory Visit: Payer: Self-pay

## 2019-04-06 ENCOUNTER — Encounter: Payer: Self-pay | Admitting: Internal Medicine

## 2019-04-06 ENCOUNTER — Ambulatory Visit (INDEPENDENT_AMBULATORY_CARE_PROVIDER_SITE_OTHER): Payer: Self-pay | Admitting: Internal Medicine

## 2019-04-06 VITALS — BP 143/75 | HR 57 | Temp 97.6°F | Ht 64.0 in | Wt 239.1 lb

## 2019-04-06 DIAGNOSIS — Z79899 Other long term (current) drug therapy: Secondary | ICD-10-CM

## 2019-04-06 DIAGNOSIS — Z79891 Long term (current) use of opiate analgesic: Secondary | ICD-10-CM

## 2019-04-06 DIAGNOSIS — R0781 Pleurodynia: Secondary | ICD-10-CM | POA: Insufficient documentation

## 2019-04-06 DIAGNOSIS — G8929 Other chronic pain: Secondary | ICD-10-CM

## 2019-04-06 DIAGNOSIS — M791 Myalgia, unspecified site: Secondary | ICD-10-CM

## 2019-04-06 DIAGNOSIS — I10 Essential (primary) hypertension: Secondary | ICD-10-CM | POA: Insufficient documentation

## 2019-04-06 DIAGNOSIS — M2550 Pain in unspecified joint: Secondary | ICD-10-CM

## 2019-04-06 DIAGNOSIS — M351 Other overlap syndromes: Secondary | ICD-10-CM

## 2019-04-06 HISTORY — DX: Essential (primary) hypertension: I10

## 2019-04-06 MED ORDER — OXYCODONE-ACETAMINOPHEN 5-325 MG PO TABS: 1.0000 | ORAL_TABLET | Freq: Every day | ORAL | 0 refills | Status: DC | PRN

## 2019-04-06 NOTE — Patient Instructions (Signed)
Thank you for allowing [place] to provide your care. Today we discussed:  1) Your chest pain sounds pleuritic in nature. It may be related to pleurisy or to your muscles. You can try ibuprofen or Aleve to help with the pain.  2) I will send out a refill for your pain medication.  3) Check your blood pressure 1 to 2 times per week. When you come back please bring a record of these blood pressure recordings and your home cuff so we can compare them. If they remain high we may need to start a medication for hypertension to decrease your risk of strokes and heart attacks.  Please come back to see me in three months or sooner if any issues arise.

## 2019-04-06 NOTE — Assessment & Plan Note (Signed)
Patient complains of pleuritic chest pain today. She states that is intermittent in nature. Exacerbated by taking a deep breath. It is painful when she pushes on her chest. Is not associated with exertion. She denies associated diaphoresis, lightheadedness, changes in vision, palpitations, shortness of breath. She is not been sick recently. On physical exam she has tenderness to palpation of the chest wall.  A/P: - Discussed the sounds pleuritic in nature. She can use NSAIDs and if the pain is not improve she will come back.

## 2019-04-06 NOTE — Assessment & Plan Note (Addendum)
Patient with chronic arthralgias and myalgias. She is currently on oxycodone 5/325 1 to 2 tablets per day (#45). She uses his medications to stay functional. Drug database reviewed. She is only get prescriptions from our clinic. Her last UDS was appropriate. She does not have any red flag symptoms.   A/P: - Continue oxycodone 5/325 1 to 2 tablets per day (#45) - Urine tox today  - Follow-up in 3 months

## 2019-04-06 NOTE — Assessment & Plan Note (Signed)
Patient noted to be hypertensive on previous appointments. Today she is again hypertensive. She is not currently on any medications. She denies signs or symptoms of an organ damage including blurry vision, headaches, altered mental status, chest pain, shortness of breath, abdominal pain. She states that she checks her blood pressure at home and is always around 120/80. She thinks that is elevated only when she comes to the doctor's office.  A/P: - Discussed checking her blood pressure 1 to 2 times per week for the next three months. At her next visit she will bring her blood pressure log and her blood pressure monitor for comparison - Attempted to get a BMP today however the patient left prior to labs getting drawn.

## 2019-04-06 NOTE — Progress Notes (Signed)
   CC: F/u for use of chronic opiate medication for therapeutic purposes, HTN  HPI:  [name] is a 58 y.o. female with PMHx listed below presenting for F/u for use of chronic opiate medication for therapeutic purposes. Please see the A&P for the status of the patient's chronic medical problems.  Past Medical History:  Diagnosis Date  . Anxiety 10/09/2010  . Arthritis    of cervical and lumbar spine  . Asthma    undiagnosed, was told by a physician in the ED, patient does not take any inhalers at home.   . Chest pain    multiple ED visit, no objective evidence of cardiac or pulmonary causes  of chest pain in past  . Restless leg syndrome 10/14/2017   Review of Systems:  Performed and all others negative.  Physical Exam: Vitals:   04/06/19 1435 04/06/19 1436  BP:  (!) 143/75  Pulse:  (!) 57  Temp:  97.6 F (36.4 C)  TempSrc:  Oral  SpO2:  99%  Weight: 239 lb 1.6 oz (108.5 kg)   Height: 5\' 4"  (1.626 m)    General: Well nourished female in no acute distress Pulm: Good air movement with no wheezing or crackles  CV: RRR, no murmurs, no rubs   Assessment & Plan:   See Encounters Tab for problem based charting.  Patient discussed with Dr. Dareen Piano

## 2019-04-07 NOTE — Progress Notes (Signed)
Internal Medicine Clinic Attending  Case discussed with Dr. Helberg at the time of the visit.  We reviewed the resident's history and exam and pertinent patient test results.  I agree with the assessment, diagnosis, and plan of care documented in the resident's note.    

## 2019-04-09 LAB — TOXASSURE SELECT,+ANTIDEPR,UR

## 2019-04-12 MED FILL — OXYCODONE-ACETAMINOPHEN 5-3: 5-325 | 22 days supply | Qty: 45 | Fill #0

## 2019-05-11 ENCOUNTER — Other Ambulatory Visit: Payer: Self-pay | Admitting: Internal Medicine

## 2019-05-11 DIAGNOSIS — M351 Other overlap syndromes: Secondary | ICD-10-CM

## 2019-05-11 DIAGNOSIS — Z79891 Long term (current) use of opiate analgesic: Secondary | ICD-10-CM

## 2019-05-11 NOTE — Telephone Encounter (Signed)
Needs refill on  oxyCODONE-acetaminophen (PERCOCET/ROXICET) 5-325 MG tablet   Elta Guadeloupe as Reviewed  [place], [place]   ;pt contact [phone]

## 2019-05-12 ENCOUNTER — Other Ambulatory Visit: Payer: Self-pay | Admitting: Internal Medicine

## 2019-05-12 DIAGNOSIS — Z79891 Long term (current) use of opiate analgesic: Secondary | ICD-10-CM

## 2019-05-12 DIAGNOSIS — M351 Other overlap syndromes: Secondary | ICD-10-CM

## 2019-05-12 MED ORDER — OXYCODONE-ACETAMINOPHEN 5-325 MG PO TABS: 1.0000 | ORAL_TABLET | Freq: Every day | ORAL | 0 refills | Status: DC | PRN

## 2019-05-12 MED FILL — OXYCODONE-ACETAMINOPHEN 5-3: 5-325 | 23 days supply | Qty: 45 | Fill #0

## 2019-06-09 ENCOUNTER — Other Ambulatory Visit: Payer: Self-pay | Admitting: Internal Medicine

## 2019-06-09 DIAGNOSIS — M351 Other overlap syndromes: Secondary | ICD-10-CM

## 2019-06-09 DIAGNOSIS — Z79891 Long term (current) use of opiate analgesic: Secondary | ICD-10-CM

## 2019-06-09 MED ORDER — OXYCODONE-ACETAMINOPHEN 5-325 MG PO TABS: 1.0000 | ORAL_TABLET | Freq: Every day | ORAL | 0 refills | Status: DC | PRN

## 2019-06-09 NOTE — Telephone Encounter (Signed)
Needs refill on oxyCODONE-acetaminophen (PERCOCET/ROXICET) 5-325 MG tablet  ;pt contact [phone]   [facility] - , [place] - [address] 

## 2019-06-10 MED FILL — OXYCODONE-APAP 5-325MG: 5-325 | 23 days supply | Qty: 45 | Fill #0

## 2019-07-06 ENCOUNTER — Encounter: Payer: Self-pay | Admitting: Internal Medicine

## 2019-07-10 ENCOUNTER — Other Ambulatory Visit: Payer: Self-pay | Admitting: Internal Medicine

## 2019-07-10 DIAGNOSIS — M351 Other overlap syndromes: Secondary | ICD-10-CM

## 2019-07-10 DIAGNOSIS — Z79891 Long term (current) use of opiate analgesic: Secondary | ICD-10-CM

## 2019-07-10 MED ORDER — OXYCODONE-ACETAMINOPHEN 5-325 MG PO TABS: 1.0000 | ORAL_TABLET | Freq: Every day | ORAL | 0 refills | Status: DC | PRN

## 2019-07-10 MED FILL — OXYCODONE-APAP 5-325MG: 5-325 | 22 days supply | Qty: 45 | Fill #0

## 2019-07-10 NOTE — Telephone Encounter (Signed)
Last rx written 06/09/19. Last OV  04/06/19. Next OV  07/20/19. UDS  04/06/19.

## 2019-07-10 NOTE — Telephone Encounter (Signed)
Needs refill on oxyCODONE-acetaminophen (PERCOCET/ROXICET) 5-325 MG tablet  ;pt contact [phone]   [facility] - [place], [place] - [address] [place]

## 2019-07-20 ENCOUNTER — Encounter: Payer: Self-pay | Admitting: Internal Medicine

## 2019-08-07 ENCOUNTER — Other Ambulatory Visit: Payer: Self-pay | Admitting: *Deleted

## 2019-08-07 DIAGNOSIS — M351 Other overlap syndromes: Secondary | ICD-10-CM

## 2019-08-07 DIAGNOSIS — Z79891 Long term (current) use of opiate analgesic: Secondary | ICD-10-CM

## 2019-08-07 MED ORDER — OXYCODONE-ACETAMINOPHEN 5-325 MG PO TABS: 1.0000 | ORAL_TABLET | Freq: Every day | ORAL | 0 refills | Status: DC | PRN

## 2019-08-07 NOTE — Telephone Encounter (Signed)
Please schedule this patient for a follow-up appointment with me. Thank you.

## 2019-08-08 MED FILL — OXYCODONE-ACETAMINOPHEN 5-3: 5-325 | 30 days supply | Qty: 45 | Fill #0

## 2019-08-08 NOTE — Telephone Encounter (Signed)
This patient has called back and scheduled an appt with you on 08/31/2019.

## 2019-08-10 ENCOUNTER — Encounter: Payer: Self-pay | Admitting: Internal Medicine

## 2019-08-31 ENCOUNTER — Other Ambulatory Visit: Payer: Self-pay

## 2019-08-31 ENCOUNTER — Ambulatory Visit (INDEPENDENT_AMBULATORY_CARE_PROVIDER_SITE_OTHER): Payer: Self-pay | Admitting: Internal Medicine

## 2019-08-31 VITALS — BP 153/72 | HR 59 | Temp 97.9°F | Ht 64.0 in | Wt 227.8 lb

## 2019-08-31 DIAGNOSIS — I10 Essential (primary) hypertension: Secondary | ICD-10-CM

## 2019-08-31 DIAGNOSIS — Z79899 Other long term (current) drug therapy: Secondary | ICD-10-CM

## 2019-08-31 DIAGNOSIS — Z79891 Long term (current) use of opiate analgesic: Secondary | ICD-10-CM

## 2019-08-31 NOTE — Assessment & Plan Note (Signed)
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 She is currently on oxycodone 5 mg 1 tab QAM and 0.5 tab QHS. This helps her to remain functional. She is currently able to do everything she wants to do. She does have days where her pain seems to be worse. But overall she does not feel as progressed. She is not been able to follow-up with rheumatology since her last visit. He has had no falls.  A/P: - Referral to rheumatology  - Continue Oxycodone 5-325mg  (#45)

## 2019-08-31 NOTE — Patient Instructions (Addendum)
Thank you for allowing me to provide your care over the last three years. I appreciated the opportunity to work with you and get to know you better. I have sent out refills on all your prescriptions.  I have also put in another referral to rheumatology. I think it would be highly beneficial for you to see them. Please make an appointment soon as you can.  I would like you to come back in three months to meet your new primary care doctor!

## 2019-08-31 NOTE — Progress Notes (Signed)
Internal Medicine Clinic Attending  Case discussed with Dr. Helberg at the time of the visit.  We reviewed the resident's history and exam and pertinent patient test results.  I agree with the assessment, diagnosis, and plan of care documented in the resident's note.    

## 2019-08-31 NOTE — Assessment & Plan Note (Signed)
Patient noted to be hypertensive at all of her clinic visits. At her last visit on 11/5 she was asked to keep a personal record of her home blood pressure recordings. Today she brought that in. Average as listed below. Her blood pressure was elevated 153/72 today. She brought in her home cuff for comparison and it yielded similar values.  Average: 129/74  A/P: - Consistent with white coat HTN but borderline  - Continue to monitor for need to initiate medications - Discussed lifestyle modifications

## 2019-08-31 NOTE — Progress Notes (Signed)
   CC: HTN, chronic use of opiate medications  HPI:  [name] is a 59 y.o. female with PMHx listed below presenting for HTN, chronic use of opiate medications. Please see the A&P for the status of the patient's chronic medical problems.  Past Medical History:  Diagnosis Date  . Anxiety 10/09/2010  . Arthritis    of cervical and lumbar spine  . Asthma    undiagnosed, was told by a physician in the ED, patient does not take any inhalers at home.   . Chest pain    multiple ED visit, no objective evidence of cardiac or pulmonary causes  of chest pain in past  . Hypertension 04/06/2019  . Restless leg syndrome 10/14/2017   Review of Systems:  Performed and all others negative.  Physical Exam: Vitals:   08/31/19 1314  BP: (!) 153/72  Pulse: (!) 59  Temp: 97.9 F (36.6 C)  TempSrc: Oral  SpO2: 100%  Weight: 227 lb 12.8 oz (103.3 kg)  Height: 5\' 4"  (1.626 m)   General: Well nourished female in no acute distress Pulm: Good air movement with no wheezing or crackles  CV: RRR, no murmurs, no rubs   Assessment & Plan:   See Encounters Tab for problem based charting.  Patient discussed with Dr. 

## 2019-09-01 LAB — BMP8+ANION GAP
Anion Gap: 13 mmol/L (ref 10.0–18.0)
BUN/Creatinine Ratio: 22 (ref 9–23)
BUN: 15 mg/dL (ref 6–24)
CO2: 25 mmol/L (ref 20–29)
Calcium: 9 mg/dL (ref 8.7–10.2)
Chloride: 103 mmol/L (ref 96–106)
Creatinine, Ser: 0.68 mg/dL (ref 0.57–1.00)
GFR calc Af Amer: 113 mL/min/{1.73_m2} (ref >59)
GFR calc non Af Amer: 98 mL/min/{1.73_m2} (ref >59)
Glucose: 92 mg/dL (ref 65–99)
Potassium: 4.6 mmol/L (ref 3.5–5.2)
Sodium: 141 mmol/L (ref 134–144)

## 2019-09-05 LAB — TOXASSURE SELECT,+ANTIDEPR,UR

## 2019-09-06 ENCOUNTER — Other Ambulatory Visit: Payer: Self-pay

## 2019-09-06 DIAGNOSIS — Z79891 Long term (current) use of opiate analgesic: Secondary | ICD-10-CM

## 2019-09-06 DIAGNOSIS — M351 Other overlap syndromes: Secondary | ICD-10-CM

## 2019-09-06 NOTE — Telephone Encounter (Signed)
oxyCODONE-acetaminophen (PERCOCET/ROXICET) 5-325 MG tablet     Refill request @  [facility] [place], [place] - [address] [place] [phone] (Phone) [phone] (Fax)

## 2019-09-07 ENCOUNTER — Other Ambulatory Visit: Payer: Self-pay | Admitting: Internal Medicine

## 2019-09-07 DIAGNOSIS — M351 Other overlap syndromes: Secondary | ICD-10-CM

## 2019-09-07 DIAGNOSIS — Z79891 Long term (current) use of opiate analgesic: Secondary | ICD-10-CM

## 2019-09-07 MED FILL — OXYCODONE-APAP 5-325MG: 5-325 | 22 days supply | Qty: 45 | Fill #0

## 2019-09-11 NOTE — Telephone Encounter (Signed)
DONE 4/8

## 2019-09-25 ENCOUNTER — Ambulatory Visit: Payer: Self-pay

## 2019-10-05 ENCOUNTER — Encounter: Payer: Self-pay | Admitting: *Deleted

## 2019-10-06 ENCOUNTER — Other Ambulatory Visit: Payer: Self-pay | Admitting: Internal Medicine

## 2019-10-06 ENCOUNTER — Other Ambulatory Visit: Payer: Self-pay

## 2019-10-06 DIAGNOSIS — M351 Other overlap syndromes: Secondary | ICD-10-CM

## 2019-10-06 DIAGNOSIS — Z79891 Long term (current) use of opiate analgesic: Secondary | ICD-10-CM

## 2019-10-06 NOTE — Telephone Encounter (Signed)
oxyCODONE-acetaminophen (PERCOCET/ROXICET) 5-325 MG tablet, REFILL REQUEST @  [facility] - [place], [place] - [address] [place] [phone] (Phone) [phone] (Fax)

## 2019-10-09 MED ORDER — OXYCODONE-ACETAMINOPHEN 5-325 MG PO TABS: 1.0000 | ORAL_TABLET | Freq: Every day | ORAL | 0 refills | Status: DC | PRN

## 2019-11-06 ENCOUNTER — Other Ambulatory Visit: Payer: Self-pay | Admitting: Internal Medicine

## 2019-11-06 DIAGNOSIS — M351 Other overlap syndromes: Secondary | ICD-10-CM

## 2019-11-06 DIAGNOSIS — Z79891 Long term (current) use of opiate analgesic: Secondary | ICD-10-CM

## 2019-11-06 MED ORDER — OXYCODONE-ACETAMINOPHEN 5-325 MG PO TABS: 1.0000 | ORAL_TABLET | Freq: Every day | ORAL | 0 refills | Status: DC | PRN

## 2019-11-06 NOTE — Telephone Encounter (Signed)
Reviewed PMDP, chronic Rx, refill is appropriate.

## 2019-11-06 NOTE — Telephone Encounter (Signed)
Need refill on oxyCODONE-acetaminophen (PERCOCET/ROXICET) 5-325 MG tablet  ;pt contact [phone]   [facility] - [place], [place] - [address] 

## 2019-12-05 ENCOUNTER — Other Ambulatory Visit: Payer: Self-pay | Admitting: Student

## 2019-12-05 DIAGNOSIS — M351 Other overlap syndromes: Secondary | ICD-10-CM

## 2019-12-05 DIAGNOSIS — Z79891 Long term (current) use of opiate analgesic: Secondary | ICD-10-CM

## 2019-12-05 MED ORDER — OXYCODONE-ACETAMINOPHEN 5-325 MG PO TABS: 1.0000 | ORAL_TABLET | Freq: Every day | ORAL | 0 refills | Status: DC | PRN

## 2019-12-05 MED FILL — OXYCODONE-APAP 5-325MG: 5-325 | 22 days supply | Qty: 45 | Fill #0

## 2019-12-05 NOTE — Telephone Encounter (Signed)
Need refill on oxyCODONE-acetaminophen (PERCOCET/ROXICET) 5-325 MG tablet  ;pt contact [phone]   [facility] - [place], [place] - [address] 

## 2020-01-03 ENCOUNTER — Other Ambulatory Visit: Payer: Self-pay | Admitting: *Deleted

## 2020-01-03 DIAGNOSIS — M351 Other overlap syndromes: Secondary | ICD-10-CM

## 2020-01-03 DIAGNOSIS — Z79891 Long term (current) use of opiate analgesic: Secondary | ICD-10-CM

## 2020-01-03 MED ORDER — OXYCODONE-ACETAMINOPHEN 5-325 MG PO TABS: 1.0000 | ORAL_TABLET | Freq: Every day | ORAL | 0 refills | Status: DC | PRN

## 2020-01-03 MED FILL — OXYCODONE-APAP 5-325MG: 5-325 | 22 days supply | Qty: 45 | Fill #0

## 2020-01-03 NOTE — Telephone Encounter (Signed)
Will refill percocet at this time, however, patient has tested positive for [facility] on recent and prior UDS. As I develop a relationship with the patient I will begin to discuss with her the need to stop the marijuana use or we will need to taper her off of her percocet. I do not believe I can have this conversation until our patient-physician relationship is established.

## 2020-01-19 ENCOUNTER — Encounter: Payer: Self-pay | Admitting: Student

## 2020-01-19 ENCOUNTER — Ambulatory Visit (INDEPENDENT_AMBULATORY_CARE_PROVIDER_SITE_OTHER): Payer: Self-pay | Admitting: Student

## 2020-01-19 ENCOUNTER — Other Ambulatory Visit: Payer: Self-pay

## 2020-01-19 VITALS — BP 148/80 | HR 55 | Wt 234.4 lb

## 2020-01-19 DIAGNOSIS — Z0000 Encounter for general adult medical examination without abnormal findings: Secondary | ICD-10-CM

## 2020-01-19 DIAGNOSIS — I878 Other specified disorders of veins: Secondary | ICD-10-CM

## 2020-01-19 DIAGNOSIS — M351 Other overlap syndromes: Secondary | ICD-10-CM

## 2020-01-19 DIAGNOSIS — I10 Essential (primary) hypertension: Secondary | ICD-10-CM

## 2020-01-19 DIAGNOSIS — Z79891 Long term (current) use of opiate analgesic: Secondary | ICD-10-CM

## 2020-01-19 NOTE — Progress Notes (Signed)
CC: Mixed Connective Tissue Disorder  HPI:  [name] is a 59 y.o. F with a PMHx of mixed connective tissue disorder, HTN who presents to the clinic today to meet her new PCP. One of her health goals is to continue to have her chronic pain from her mixed connective tissue disorder under control. She states that her pain regimen of oxy-acetaminophen and NSAIDS for breakthrough pain controls her pain well. She notes that she has been unable to see a rheumatologist in the past due to not having insurance, however, the patient currently has the orange card. She would like to apply for disability.   She notes in the past she only had high blood pressure when she was in the clinic. She notes Dr. Caron Presume had her check her blood pressures at home and they averaged in the 120's. She then states he did not request she continue checking her pressures at home.   She also notes she has had worsening in her lower extremity swelling for the past 2 weeks. She notes however it has improved since onset. She denies feeling short of breath with increased activity or needing to sleep on more pillows at night due to shortness of breath. Denies pain in either leg. She notes she has had lower extremity swelling in the past. She has tried compression stockings but notes that they were painful to wear.  She is unsure about receiving the COVID vaccine as she wanted to make sure it was safe to receive with her mixed connective tissue disorder.   She has no other complaints at time of my examination  Past Medical History:  Diagnosis Date  . Anxiety 10/09/2010  . Arthritis    of cervical and lumbar spine  . Asthma    undiagnosed, was told by a physician in the ED, patient does not take any inhalers at home.   . Chest pain    multiple ED visit, no objective evidence of cardiac or pulmonary causes  of chest pain in past  . Hypertension 04/06/2019  . Restless leg syndrome 10/14/2017   Review of Systems:  As per  HPI  Physical Exam:  Vitals:   01/19/20 1331 01/19/20 1424  BP: (!) 157/70 (!) 148/80  Pulse: (!) 58 (!) 55  SpO2: 98%   Weight: 234 lb 6.4 oz (106.3 kg)    Physical Exam Vitals and nursing note reviewed.  Constitutional:      Appearance: Normal appearance.  HENT:     Head: Normocephalic and atraumatic.  Eyes:     Extraocular Movements: Extraocular movements intact.  Cardiovascular:     Rate and Rhythm: Normal rate and regular rhythm.     Pulses: Normal pulses.     Heart sounds: Normal heart sounds. No murmur heard.  No gallop.   Pulmonary:     Effort: Pulmonary effort is normal. No respiratory distress.     Breath sounds: Normal breath sounds. No stridor. No wheezing, rhonchi or rales.  Musculoskeletal:     Cervical back: Normal range of motion.     Right lower leg: Edema (trace) present.     Left lower leg: Edema (trace) present.  Skin:    General: Skin is warm and dry.     Findings: Erythema (bilateral lower extremities) present.  Neurological:     General: No focal deficit present.     Mental Status: She is alert and oriented to person, place, and time. Mental status is at baseline.  Psychiatric:  Thought Content: Thought content normal.        Judgment: Judgment normal.     Comments: Patient tearful when discussing her chronic pain    Assessment & Plan:   See Encounters Tab for problem based charting.  Patient discussed with and seen by Dr. Sandre Kitty

## 2020-01-19 NOTE — Assessment & Plan Note (Signed)
Hx of venous stasis of lower extremities. Punch biopsy confirmed diagnosis 2017.   A/P - Patient states swelling has improved over past few days - Recommended patient purchase stockings/hose as may be less painful than compression socks.

## 2020-01-19 NOTE — Assessment & Plan Note (Addendum)
°  A/P -Patient states she will get the COVID shot, was hesitant at first due to having mixed connective tissue disorder.  -Patient to schedule pap -Referral in place for mammogram.

## 2020-01-19 NOTE — Assessment & Plan Note (Addendum)
Patient continues to have pain due to her MCD. She states it is well controlled but at times the pain is unbearable. She takes 1-2 oxycodone tabs a day and also supplements with ibuprofen for breakthrough pain. She notes feeling as though she is able to perform her ADL's. She became tearful on examination when talking about her pain. Denies wanting to speak with counselor at this time. Will research support groups for patient's with mixed connective tissue disorders.   Patient continues to be uninsured outside of orange card and is unable to receive rheumatologic evaluation. She also notes wanting to apply for disability and because our office is not as familiar with this process, recommended patient reach out to a disability lawyer to further assist patient in this matter.  Last UDS 08/31/19. Will repeat approximately same time next year. She is compliant and not receiving  pain medications from other providers.  A/P - Will continue to work with patient and help find rheumatologist - Patient to reach out to disability lawyer.  - Continue pain medication regimen at this time. - Will search for support groups for patients with mixed connective tissue disoders.

## 2020-01-19 NOTE — Patient Instructions (Signed)
Thank you for allowing [place] to assist in your care today!   Today we discussed  Mixed Connective Tissue Disoder - Please reach out to a disability lawyer to see if they can assist you in that process - We will continue to try and find a rheumatologist who will see uninsured patients - I will look for support groups as well  High Blood Pressure - Please keep a blood pressure log at home and bring it to your next appointment!  - If you find your blood pressure is consistently high, please call and schedule an appointment sooner  COVID Vaccine - Please get your vaccine as soon as possible!   Health Maintenance  - Please schedule for a pap smear and we will put in a referral for a mammogram.  Thank you and please call our office with any questions or concerns

## 2020-01-19 NOTE — Assessment & Plan Note (Addendum)
Patient has history of high blood pressure while in clinic, suspected white coat syndrome. Patient's prior PCP had patient keep a BP log and average BP was 129/74. Will have patient continue log twice daily and bring back in at three month visit.   A/P Continue to monitor and will initate medications as needed Patient to monitor BP twice daily and bring log in at next visit

## 2020-01-19 NOTE — Assessment & Plan Note (Signed)
Last UDS 07/2019. Will repeat annually. Patient has history of compliance with contract.

## 2020-01-22 NOTE — Progress Notes (Signed)
Internal Medicine Clinic Attending  I saw and evaluated the patient.  I personally confirmed the key portions of the history and exam documented by Dr. Katsadouros and I reviewed pertinent patient test results.  The assessment, diagnosis, and plan were formulated together and I agree with the documentation in the resident's note.  Claus Silvestro, M.D., Ph.D.  

## 2020-02-01 ENCOUNTER — Other Ambulatory Visit: Payer: Self-pay | Admitting: Student

## 2020-02-01 ENCOUNTER — Other Ambulatory Visit: Payer: Self-pay | Admitting: Internal Medicine

## 2020-02-01 DIAGNOSIS — Z79891 Long term (current) use of opiate analgesic: Secondary | ICD-10-CM

## 2020-02-01 DIAGNOSIS — M351 Other overlap syndromes: Secondary | ICD-10-CM

## 2020-02-01 MED ORDER — OXYCODONE-ACETAMINOPHEN 5-325 MG PO TABS: 1.0000 | ORAL_TABLET | Freq: Every day | ORAL | 0 refills | Status: DC | PRN

## 2020-02-01 MED FILL — OXYCODONE-APAP 5-325MG: 5-325 | 22 days supply | Qty: 45 | Fill #0

## 2020-02-01 NOTE — Telephone Encounter (Signed)
I've placed a future order to [facility].

## 2020-02-01 NOTE — Telephone Encounter (Signed)
Need refill on oxyCODONE-acetaminophen (PERCOCET/ROXICET) 5-325 MG tablet  ;pt contact [phone]   [facility] - [place], [place] - [address] 

## 2020-03-01 ENCOUNTER — Other Ambulatory Visit: Payer: Self-pay

## 2020-03-01 DIAGNOSIS — Z79891 Long term (current) use of opiate analgesic: Secondary | ICD-10-CM

## 2020-03-01 DIAGNOSIS — M351 Other overlap syndromes: Secondary | ICD-10-CM

## 2020-03-01 MED ORDER — OXYCODONE-ACETAMINOPHEN 5-325 MG PO TABS: 1.0000 | ORAL_TABLET | Freq: Every day | ORAL | 0 refills | Status: DC | PRN

## 2020-03-01 MED FILL — OXYCODONE-APAP 5-325MG: 5-325 | 23 days supply | Qty: 45 | Fill #0

## 2020-03-01 NOTE — Telephone Encounter (Signed)
oxyCODONE-acetaminophen (PERCOCET/ROXICET) 5-325 MG tablet refill request @  [facility] [place], [place] - [address] [place] Phone:  [phone]  Fax:  [phone]

## 2020-03-01 NOTE — Telephone Encounter (Signed)
Last rx written 02/01/20. Last OV 01/19/20. Next OV 04/22/20. UDS 08/31/19.

## 2020-03-29 ENCOUNTER — Other Ambulatory Visit: Payer: Self-pay

## 2020-03-29 ENCOUNTER — Other Ambulatory Visit (HOSPITAL_COMMUNITY): Payer: Self-pay | Admitting: Internal Medicine

## 2020-03-29 DIAGNOSIS — Z79891 Long term (current) use of opiate analgesic: Secondary | ICD-10-CM

## 2020-03-29 DIAGNOSIS — M351 Other overlap syndromes: Secondary | ICD-10-CM

## 2020-03-29 MED ORDER — OXYCODONE-ACETAMINOPHEN 5-325 MG PO TABS: 1.0000 | ORAL_TABLET | Freq: Every day | ORAL | 0 refills | Status: DC | PRN

## 2020-03-29 NOTE — Telephone Encounter (Signed)
oxyCODONE-acetaminophen (PERCOCET/ROXICET) 5-325 MG tablet, REFILL REQUEST @  [facility] - [place], [place] - [address] Phone:  [phone]  Fax:  [phone]      

## 2020-04-01 MED FILL — OXYCODONE-APAP 5-325MG: 5-325 | 22 days supply | Qty: 45 | Fill #0

## 2020-04-05 ENCOUNTER — Telehealth: Payer: Self-pay | Admitting: *Deleted

## 2020-04-05 ENCOUNTER — Emergency Department (HOSPITAL_COMMUNITY): Payer: Self-pay

## 2020-04-05 ENCOUNTER — Emergency Department (HOSPITAL_COMMUNITY)
Admission: EM | Admit: 2020-04-05 | Discharge: 2020-04-06 | Disposition: A | Discharge status: Home / Self Care | Payer: Self-pay | Attending: Emergency Medicine | Admitting: Emergency Medicine

## 2020-04-05 ENCOUNTER — Other Ambulatory Visit: Payer: Self-pay

## 2020-04-05 DIAGNOSIS — R0789 Other chest pain: Secondary | ICD-10-CM | POA: Insufficient documentation

## 2020-04-05 DIAGNOSIS — I10 Essential (primary) hypertension: Secondary | ICD-10-CM | POA: Insufficient documentation

## 2020-04-05 DIAGNOSIS — Z87891 Personal history of nicotine dependence: Secondary | ICD-10-CM | POA: Insufficient documentation

## 2020-04-05 DIAGNOSIS — J45909 Unspecified asthma, uncomplicated: Secondary | ICD-10-CM | POA: Insufficient documentation

## 2020-04-05 LAB — CBC
HCT: 43.8 % (ref 36.0–46.0)
Hemoglobin: 14.4 g/dL (ref 12.0–15.0)
MCH: 29.9 pg (ref 26.0–34.0)
MCHC: 32.9 g/dL (ref 30.0–36.0)
MCV: 90.9 fL (ref 80.0–100.0)
Platelets: 190 10*3/uL (ref 150–400)
RBC: 4.82 MIL/uL (ref 3.87–5.11)
RDW: 13.1 % (ref 11.5–15.5)
WBC: 8.6 10*3/uL (ref 4.0–10.5)
nRBC: 0 % (ref 0.0–0.2)

## 2020-04-05 LAB — TROPONIN I (HIGH SENSITIVITY)
Troponin I (High Sensitivity): 3 ng/L (ref <18)
Troponin I (High Sensitivity): 4 ng/L (ref <18)

## 2020-04-05 LAB — BASIC METABOLIC PANEL
Anion gap: 12 (ref 5–15)
BUN: 17 mg/dL (ref 6–20)
CO2: 23 mmol/L (ref 22–32)
Calcium: 9.2 mg/dL (ref 8.9–10.3)
Chloride: 103 mmol/L (ref 98–111)
Creatinine, Ser: 0.72 mg/dL (ref 0.44–1.00)
GFR, Estimated: >60 mL/min (ref >60)
Glucose, Bld: 176 mg/dL — ABNORMAL HIGH (ref 70–99)
Potassium: 3.9 mmol/L (ref 3.5–5.1)
Sodium: 138 mmol/L (ref 135–145)

## 2020-04-05 LAB — I-STAT BETA HCG BLOOD, ED (MC, WL, AP ONLY): I-stat hCG, quantitative: <5 m[IU]/mL (ref <5)

## 2020-04-05 NOTE — ED Notes (Signed)
Pt report worsening of her chest pain.

## 2020-04-05 NOTE — ED Triage Notes (Signed)
Pt arrives to ED with c/o of chest pain that has been ongoing for a few months but recently over this week has been more intense with a burning sensation  in her lungs with a deep breath.  She states this feels like pneumonia.

## 2020-04-05 NOTE — Telephone Encounter (Signed)
Yes, I agree 

## 2020-04-05 NOTE — Telephone Encounter (Signed)
Pt states she has had for several months, "twinges in her chest, L arm pain, has been increasing now she says that the episodes are closer and lasting longer with shortness of breath and just not feeling well when she has "one". She is advised to call 911 or have someone take her to ED ASAP Do you agree?

## 2020-04-06 MED ORDER — KETOROLAC TROMETHAMINE 60 MG/2ML IM SOLN
30.0000 mg | Freq: Once | INTRAMUSCULAR | Status: AC
  Administered 2020-04-06: 30 mg via INTRAMUSCULAR
  Filled 2020-04-06: qty 2

## 2020-04-06 MED ORDER — LIDOCAINE HCL (PF) 1 % IJ SOLN
INTRAMUSCULAR | Status: AC
  Filled 2020-04-06: qty 5

## 2020-04-06 NOTE — Discharge Instructions (Addendum)
You may use over-the-counter Motrin (Ibuprofen), Acetaminophen (Tylenol), topical muscle creams such as SalonPas, Icy Hot, Bengay, etc. Please stretch, apply heat, and have massage therapy for additional assistance. ° °

## 2020-04-06 NOTE — ED Provider Notes (Signed)
[facility] EMERGENCY DEPARTMENT Provider Note  CSN: [id number] Arrival date & time: 04/05/20 1432  Chief Complaint(s) Chest Pain  HPI Sonya Smith is a 59 y.o. female with a history of mixed connective tissue disorder and chronic pain here for 2 months of left-sided chest pain described as a deep ache that has worsened over the past week and become a burning-like sensation worse with movement and palpation as well as deep breathing. She did report doing some housecleaning last week, which might have exacerbated it.  Pain is nonradiating and nonexertional.  No nausea or vomiting.  No abdominal pain.  No other physical complaints.  Patient is concerned that she might have pneumonia because she had pain like this in the past with it. She denies any fevers or coughing.  HPI  Past Medical History Past Medical History:  Diagnosis Date  . Anxiety 10/09/2010  . Arthritis    of cervical and lumbar spine  . Asthma    undiagnosed, was told by a physician in the ED, patient does not take any inhalers at home.   . Chest pain    multiple ED visit, no objective evidence of cardiac or pulmonary causes  of chest pain in past  . Hypertension 04/06/2019  . Restless leg syndrome 10/14/2017   Patient Active Problem List   Diagnosis Date Noted  . Venous stasis of both lower extremities 01/19/2020  . Hypertension 04/06/2019  . Pleuritic chest pain 04/06/2019  . Healthcare maintenance 10/14/2017  . Chronic use of opiate drug for therapeutic purpose 06/10/2016  . Mixed connective tissue disease (HCC) 01/07/2016   Home Medication(s) Prior to Admission medications   Medication Sig Start Date End Date Taking? Authorizing Provider  aspirin EC 81 MG tablet Take 162 mg by mouth daily as needed for mild pain.   Yes [provider]  diclofenac sodium (VOLTAREN) 1 % GEL Apply 2 g topically 4 (four) times daily as needed (muscle pain).   Yes [provider]  ibuprofen  (ADVIL) 200 MG tablet Take 400 mg by mouth every 6 (six) hours as needed for moderate pain.   Yes [provider]  oxyCODONE-acetaminophen (PERCOCET/ROXICET) 5-325 MG tablet Take 1-2 tablets by mouth daily as needed for severe pain. 04/01/20  Yes Eliezer Bottom, MD                                                                                                                                    Past Surgical History Past Surgical History:  Procedure Laterality Date  . drainage of peri tonsillar abscess    . peri tonsillar abscess  10/11   drainage   Family History Family History  Problem Relation Age of Onset  . Diabetes Mother   . Stroke Maternal Aunt        66s  . Cancer Maternal Uncle        recurrent  . Hypertension Sister  Social History Social History   Tobacco Use  . Smoking status: Former Smoker    Packs/day: 1.00    Years: 30.00    Pack years: 30.00    Types: Cigarettes    Quit date: 10/09/2003    Years since quitting: 16.5  . Smokeless tobacco: Never Used  Vaping Use  . Vaping Use: Never used  Substance Use Topics  . Alcohol use: No    Alcohol/week: 0.0 standard drinks  . Drug use: Yes    Comment: occassional marijuana   Allergies Meloxicam, Codeine, and Darvocet [propoxyphene n-acetaminophen]  Review of Systems Review of Systems All other systems are reviewed and are negative for acute change except as noted in the HPI  Physical Exam Vital Signs  I have reviewed the triage vital signs BP 136/68 (BP Location: Right Arm)   Pulse (!) 59   Temp 98.8 F (37.1 C) (Oral)   Resp 16   Ht 5\' 4"  (1.626 m)   Wt 99.8 kg   LMP 07/07/2015   SpO2 97%   BMI 37.76 kg/m   Physical Exam Vitals reviewed.  Constitutional:      General: She is not in acute distress.    Appearance: She is well-developed. She is not diaphoretic.  HENT:     Head: Normocephalic and atraumatic.     Nose: Nose normal.  Eyes:     General: No scleral icterus.       Right  eye: No discharge.        Left eye: No discharge.     Conjunctiva/sclera: Conjunctivae normal.     Pupils: Pupils are equal, round, and reactive to light.  Cardiovascular:     Rate and Rhythm: Normal rate and regular rhythm.     Heart sounds: No murmur heard.  No friction rub. No gallop.   Pulmonary:     Effort: Pulmonary effort is normal. No respiratory distress.     Breath sounds: Normal breath sounds. No stridor. No rales.    Chest:     Chest wall: Tenderness present.    Abdominal:     General: There is no distension.     Palpations: Abdomen is soft.     Tenderness: There is no abdominal tenderness.  Musculoskeletal:        General: No tenderness.     Cervical back: Normal range of motion and neck supple.  Skin:    General: Skin is warm and dry.     Findings: No erythema or rash.  Neurological:     Mental Status: She is alert and oriented to person, place, and time.     ED Results and Treatments Labs (all labs ordered are listed, but only abnormal results are displayed) Labs Reviewed  BASIC METABOLIC PANEL - Abnormal; Notable for the following components:      Result Value   Glucose, Bld 176 (*)    All other components within normal limits  CBC  I-STAT BETA HCG BLOOD, ED ([facility], [facility], [facility] ONLY)  TROPONIN I (HIGH SENSITIVITY)  TROPONIN I (HIGH SENSITIVITY)  EKG  EKG Interpretation  Date/Time:  Friday April 05 2020 16:26:33 EDT Ventricular Rate:  57 PR Interval:  136 QRS Duration: 92 QT Interval:  418 QTC Calculation: 406 R Axis:   84 Text Interpretation: Sinus bradycardia Otherwise normal ECG No significant change since last tracing Confirmed by Drema Pry [phone]) on 04/06/2020 1:26:21 AM      Radiology DG Chest 2 View  Result Date: 04/05/2020 CLINICAL DATA:  Mid and left chest pain for 2 weeks. Pain down left arm. EXAM: CHEST - 2 VIEW  COMPARISON:  Chest radiograph 11/03/2018 FINDINGS: The cardiomediastinal contours are normal. Mild bronchial thickening is increased from prior. Pulmonary vasculature is normal. No consolidation, pleural effusion, or pneumothorax. No acute osseous abnormalities are seen. Mild thoracic scoliosis, unchanged. IMPRESSION: Mild bronchial thickening.  No other acute findings. Electronically Signed   By: Narda Rutherford M.D.   On: 04/05/2020 15:25    Pertinent labs & imaging results that were available during my care of the patient were reviewed by me and considered in my medical decision making (see chart for details).  Medications Ordered in ED Medications  ketorolac (TORADOL) injection 30 mg (30 mg Intramuscular Given 04/06/20 0227)                                                                                                                                    Procedures Procedures  (including critical care time)  Medical Decision Making / ED Course I have reviewed the nursing notes for this encounter and the patient's prior records (if available in EHR or on provided paperwork).   DELLA SCRIVENER was evaluated in Emergency Department on 04/06/2020 for the symptoms described in the history of present illness. She was evaluated in the context of the global COVID-19 pandemic, which necessitated consideration that the patient might be at risk for infection with the SARS-CoV-2 virus that causes COVID-19. Institutional protocols and algorithms that pertain to the evaluation of patients at risk for COVID-19 are in a state of rapid change based on information released by regulatory bodies including the CDC and federal and state organizations. These policies and algorithms were followed during the patient's care in the ED.  Atypical chest pain.  EKG without acute ischemic changes or evidence of pericarditis.  Troponins negative.  No leukocytosis or anemia.  No significant electrolyte derangements or renal  sufficiency.  Chest x-ray without evidence suggestive of pneumonia, pneumothorax, pneumomediastinum.  No abnormal contour of the mediastinum to suggest dissection. No evidence of acute injuries.  Presentation is not classic for aortic dissection or esophageal perforation.  Low suspicion for pulmonary embolism.      Final Clinical Impression(s) / ED Diagnoses Final diagnoses:  Chest wall pain   The patient appears reasonably screened and/or stabilized for discharge and I doubt any other medical condition or other [facility] requiring further screening, evaluation, or treatment in the ED at this time prior to discharge. Safe  for discharge with strict return precautions.  Disposition: Discharge  Condition: Good  I have discussed the results, Dx and Tx plan with the patient/family who expressed understanding and agree(s) with the plan. Discharge instructions discussed at length. The patient/family was given strict return precautions who verbalized understanding of the instructions. No further questions at time of discharge.    ED Discharge Orders    None       Follow Up: Primary care provider  Schedule an appointment as soon as possible for a visit        This chart was dictated using voice recognition software.  Despite best efforts to proofread,  errors can occur which can change the documentation meaning.   Nira Connardama, Mariel Lukins Eduardo, MD 04/06/20 [phone]

## 2020-04-08 ENCOUNTER — Other Ambulatory Visit: Payer: Self-pay

## 2020-04-08 DIAGNOSIS — Z79891 Long term (current) use of opiate analgesic: Secondary | ICD-10-CM

## 2020-04-16 LAB — TOXASSURE SELECT,+ANTIDEPR,UR

## 2020-04-22 ENCOUNTER — Encounter: Payer: Self-pay | Admitting: Student

## 2020-04-22 ENCOUNTER — Other Ambulatory Visit: Payer: Self-pay

## 2020-04-22 ENCOUNTER — Ambulatory Visit (INDEPENDENT_AMBULATORY_CARE_PROVIDER_SITE_OTHER): Payer: Self-pay | Admitting: Student

## 2020-04-22 VITALS — BP 174/91 | HR 52 | Temp 97.9°F | Ht 64.0 in | Wt 236.8 lb

## 2020-04-22 DIAGNOSIS — I10 Essential (primary) hypertension: Secondary | ICD-10-CM

## 2020-04-22 DIAGNOSIS — M351 Other overlap syndromes: Secondary | ICD-10-CM

## 2020-04-22 DIAGNOSIS — I878 Other specified disorders of veins: Secondary | ICD-10-CM

## 2020-04-22 DIAGNOSIS — Z79891 Long term (current) use of opiate analgesic: Secondary | ICD-10-CM

## 2020-04-22 NOTE — Progress Notes (Signed)
Internal Medicine Clinic Attending  I saw and evaluated the patient.  I personally confirmed the key portions of the history and exam documented by Dr. Katsadouros and I reviewed pertinent patient test results.  The assessment, diagnosis, and plan were formulated together and I agree with the documentation in the resident's note.  

## 2020-04-22 NOTE — Progress Notes (Signed)
CC: Chronic Pain  HPI:  [name] is a 59 y.o. female with a past medical history stated below and presents today for chronic pain. Please see problem based assessment and plan for additional details.  Past Medical History:  Diagnosis Date  . Anxiety 10/09/2010  . Arthritis    of cervical and lumbar spine  . Asthma    undiagnosed, was told by a physician in the ED, patient does not take any inhalers at home.   . Chest pain    multiple ED visit, no objective evidence of cardiac or pulmonary causes  of chest pain in past  . Hypertension 04/06/2019  . Restless leg syndrome 10/14/2017    Current Outpatient Medications on File Prior to Visit  Medication Sig Dispense Refill  . aspirin EC 81 MG tablet Take 162 mg by mouth daily as needed for mild pain.    Marland Kitchen diclofenac sodium (VOLTAREN) 1 % GEL Apply 2 g topically 4 (four) times daily as needed (muscle pain).    Marland Kitchen ibuprofen (ADVIL) 200 MG tablet Take 400 mg by mouth every 6 (six) hours as needed for moderate pain.    Marland Kitchen oxyCODONE-acetaminophen (PERCOCET/ROXICET) 5-325 MG tablet Take 1-2 tablets by mouth daily as needed for severe pain. 45 tablet 0   No current facility-administered medications on file prior to visit.    Family History  Problem Relation Age of Onset  . Diabetes Mother   . Stroke Maternal Aunt        65s  . Cancer Maternal Uncle        recurrent  . Hypertension Sister     Social History   Socioeconomic History  . Marital status: Divorced    Spouse name: Not on file  . Number of children: Not on file  . Years of education: Not on file  . Highest education level: Not on file  Occupational History  . Not on file  Tobacco Use  . Smoking status: Former Smoker    Packs/day: 1.00    Years: 30.00    Pack years: 30.00    Types: Cigarettes    Quit date: 10/09/2003    Years since quitting: 16.5  . Smokeless tobacco: Never Used  Vaping Use  . Vaping Use: Never used  Substance and Sexual Activity  .  Alcohol use: No    Alcohol/week: 0.0 standard drinks  . Drug use: Yes    Comment: occassional marijuana  . Sexual activity: Not Currently  Other Topics Concern  . Not on file  Social History Narrative   Lives in [place], [place] with her boy friend who is disabled. 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 She has a daughter who is independent and provides her money for medicines. 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  No health insurance.    Social Determinants of Health   Financial Resource Strain:   . Difficulty of Paying Living Expenses: Not on file  Food Insecurity:   . Worried About Programme researcher, broadcasting/film/video in the Last Year: Not on file  . Ran Out of Food in the Last Year: Not on file  Transportation Needs:   . Lack of Transportation (Medical): Not on file  . Lack of Transportation (Non-Medical): Not on file  Physical Activity:   . Days of Exercise per Week: Not on file  . Minutes of Exercise per Session: Not on file  Stress:   .  Feeling of Stress : Not on file  Social Connections:   . Frequency of Communication with Friends and Family: Not on file  . Frequency of Social Gatherings with Friends and Family: Not on file  . Attends Religious Services: Not on file  . Active Member of Clubs or Organizations: Not on file  . Attends Banker Meetings: Not on file  . Marital Status: Not on file  Intimate Partner Violence:   . Fear of Current or Ex-Partner: Not on file  . Emotionally Abused: Not on file  . Physically Abused: Not on file  . Sexually Abused: Not on file    Review of Systems: ROS negative except for what is noted on the assessment and plan.  There were no vitals filed for this visit.   Physical Exam: Physical Exam Vitals and nursing note reviewed.  Constitutional:      Appearance: Normal appearance.  HENT:     Head: Normocephalic and atraumatic.  Cardiovascular:     Rate and Rhythm:  Normal rate and regular rhythm.     Pulses: Normal pulses.     Heart sounds: Normal heart sounds. No murmur heard.  No friction rub. No gallop.   Pulmonary:     Effort: Pulmonary effort is normal. No respiratory distress.     Breath sounds: Normal breath sounds. No stridor. No wheezing, rhonchi or rales.  Musculoskeletal:     Right lower leg: Edema (1+) present.     Left lower leg: Edema (1+) present.     Comments: Lower extremities consistent with venous stasis dermatitis.   RLE tender to palpate diffusely and worsening pain when palpating over circular lower extremity mass. Area is not warm, no localized swelling to the area.   Neurological:     General: No focal deficit present.     Mental Status: She is alert and oriented to person, place, and time. Mental status is at baseline.  Psychiatric:        Mood and Affect: Mood normal.        Behavior: Behavior normal.      Assessment & Plan:   See Encounters Tab for problem based charting.  Patient seen with Dr. Bonne Dolores, D.O. [facility] Internal Medicine, PGY-1 Pager: [phone], Phone: [phone] Date 04/22/2020 Time 11:00 AM

## 2020-04-22 NOTE — Assessment & Plan Note (Addendum)
Assessment: Patient continues to endorse significant diffuse pain that is relieved by percocet and ibuprofen. She notes that she has not filled out the paperwork for disability or medicaid yet as she has had a stressful three months. She notes many stressors, offered counseling services, but denies wanting to meet with them at this time (PHQ-9) = 0.   The plan during the patient's prior visit was to have her follow up with rheumatology, however, she no longer has the orange card as it has expired. As such, she has been unable to follow up with rheumatology.   In the past multiple attempts have been made for the patient to be seen by rheumatology for her chronic pain and positive RNP which led to the suspicion of the patient have mixed connective tissue disease.   She is quite frustrated with whether or not she has MCT disease, but I informed her that all of her rheumatological tests have returned negative except for her RNP which can be indicative of MCT. In the past she has failed to follow up with [facility] rheumatology.    Also discussed that chronic opioids are not the definitive treatment for MCT and that is why we are trying to have her be seen by rheumatology. I informed if she continues to be non-adherent with the plan, then we will have to stop prescribing her chronic opioid medications as they should be temporary until the underlying cause of her pain is found.  Because the patient does not have insurance at this time, will repeat ESR and CRP and start with that. If positive, will consider steroid treatment. Also considering x-ray of hands and ankles to assess for rheumatological changes. This may help use in finding the underlying etiology of the patient's chronic pain. She does have family history of HLA-B27.   Patient has appointment to meet with financial services in the clinic and apply for orange card and cone financial assistance program. If she is approved, then she will be able to see  rheumatology here at [facility] through orthopedics.   Plan: - ESR and CRP. If positive, trial steroids. If negative consider imaging hands and ankle. - Due to patient currently being uninsured, will hold off on repeating myositis panel and ANA.   - Pending referral to rheum at [facility] if patient approved by financial services.

## 2020-04-22 NOTE — Patient Instructions (Signed)
Thank you, [name] for allowing [place] to provide your care today. Today we discussed your mixed connective tissue disorder and chronic pain. Our goal is to have you be seen by rheumatology please keep your appointment to apply for the orange card.   Please continue to work to apply for medicare and disability.   I will work with our staff here to figure out how to get you in with rheumatology  We are also running two labs today, if they are positive I will call you and prescribed a dose of steroids.   Please check your blood pressure at home and record the results and bring them into your next visit.    I have ordered the following labs for you:   Lab Orders     Sed Rate (ESR)     CRP (C-Reactive Protein)    Referrals ordered today:   Referral Orders  No referral(s) requested today     I have ordered the following medication/changed the following medications:   Stop the following medications: There are no discontinued medications.   Start the following medications: No orders of the defined types were placed in this encounter.    Follow up: 3 months    Remember: Call us if you need help with any of the above plan.   Should you have any questions or concerns please call the internal medicine clinic at [phone].     Sanjuana Letters, D.O. [place]

## 2020-04-22 NOTE — Assessment & Plan Note (Addendum)
Assessment: Patient with 1+ pitting edema of the lower extremities and venous stasis dermatitis. She notes purchasing compression stockings after prior visit with me 12/2019, however, they caused the patient significant amount of pain. I recommended she check sports stores as they have compression socks that are less tight and may not cause her as much pain.   She does have areas of erythema on her lower extremities and on the left lower extremity one area that is raised and feels almost like a mass, tender to palpate. Patient with punch biopsy in 2017, confirmed diagnosis of venous statis, no signs of mixed connective tissue disorder seen.   Low suspicion for vasculitis at this time, however, repeating ESR and CRP.  Do not suspect DVT, PAD, or lymphedema as source of swelling at this time.  Plan: - Continue conservative management with compression socks - Pending CRP and ESR

## 2020-04-22 NOTE — Assessment & Plan Note (Signed)
Assessment: BP: (!) 174/91  Patient with history of white coat syndrome and elevated blood pressure when in the clinic. Notes her blood pressures at home range from 120-130's systolically. She did not bring her BP log as requested at our last visit.   She was seen in the ED with a BP 136/68 on 04/05/20.   Instructed patient to bring in blood pressure log at next visit and to keep checking blood pressure twice daily.   Plan: - Will continue to monitor - Request patient bring in blood pressure readings at next visit.

## 2020-04-23 LAB — SEDIMENTATION RATE: Sed Rate: 25 mm/hr (ref 0–40)

## 2020-04-23 LAB — C-REACTIVE PROTEIN: CRP: 8 mg/L (ref 0–10)

## 2020-04-23 NOTE — Progress Notes (Signed)
Patient called and informed of unremarkable inflammatory markers. Also discussed with patient importance of making sure she brings all necessary paperwork to financial assistance appointment on 11/29. If approved for CAFA and orange card, a rheumatology referral can then be made for patient to be evaluated for her chronic pain.

## 2020-04-29 ENCOUNTER — Ambulatory Visit: Payer: Self-pay

## 2020-04-30 ENCOUNTER — Other Ambulatory Visit: Payer: Self-pay | Admitting: Student

## 2020-04-30 ENCOUNTER — Other Ambulatory Visit: Payer: Self-pay

## 2020-04-30 DIAGNOSIS — Z79891 Long term (current) use of opiate analgesic: Secondary | ICD-10-CM

## 2020-04-30 DIAGNOSIS — M351 Other overlap syndromes: Secondary | ICD-10-CM

## 2020-04-30 MED ORDER — OXYCODONE-ACETAMINOPHEN 5-325 MG PO TABS: 1.0000 | ORAL_TABLET | Freq: Every day | ORAL | 0 refills | Status: DC | PRN

## 2020-04-30 MED FILL — OXYCODONE-APAP 5-325MG: 5-325 | 22 days supply | Qty: 45 | Fill #0

## 2020-04-30 NOTE — Telephone Encounter (Signed)
Need refill on oxyCODONE-acetaminophen (PERCOCET/ROXICET) 5-325 MG tablet  ;pt contact [phone]   [facility] - [place],  - [address] 

## 2020-06-03 ENCOUNTER — Other Ambulatory Visit: Payer: Self-pay

## 2020-06-03 ENCOUNTER — Other Ambulatory Visit: Payer: Self-pay | Admitting: Internal Medicine

## 2020-06-03 DIAGNOSIS — Z79891 Long term (current) use of opiate analgesic: Secondary | ICD-10-CM

## 2020-06-03 DIAGNOSIS — M351 Other overlap syndromes: Secondary | ICD-10-CM

## 2020-06-03 MED ORDER — OXYCODONE-ACETAMINOPHEN 5-325 MG PO TABS: 1.0000 | ORAL_TABLET | Freq: Every day | ORAL | 0 refills | Status: DC | PRN

## 2020-06-03 MED FILL — OXYCODONE-APAP 5-325MG: 5-325 | 22 days supply | Qty: 45 | Fill #0

## 2020-06-03 NOTE — Assessment & Plan Note (Addendum)
Refill request for percocet 5-325 mg 1-2 tablets daily for pain. Medication is for myalgias and arthralgia secondary to possible mixed connective tissue disease. Last UDS 04/08/20 with expected results. Last refill 04/30/20, refill appropriate.   - refilled percocet 5-325 mg 1-2 tablets qd prn for pain

## 2020-06-03 NOTE — Telephone Encounter (Signed)
oxyCODONE-acetaminophen (PERCOCET/ROXICET) 5-325 MG tablet, refill request @  [facility] [place], [place] - [address] [place] Phone:  [phone]  Fax:  [phone]

## 2020-07-04 ENCOUNTER — Other Ambulatory Visit: Payer: Self-pay | Admitting: Student

## 2020-07-04 ENCOUNTER — Other Ambulatory Visit: Payer: Self-pay

## 2020-07-04 DIAGNOSIS — M351 Other overlap syndromes: Secondary | ICD-10-CM

## 2020-07-04 DIAGNOSIS — Z79891 Long term (current) use of opiate analgesic: Secondary | ICD-10-CM

## 2020-07-04 MED ORDER — OXYCODONE-ACETAMINOPHEN 5-325 MG PO TABS: 1.0000 | ORAL_TABLET | Freq: Every day | ORAL | 0 refills | Status: DC | PRN

## 2020-07-04 MED FILL — OXYCODONE-APAP 5-325MG: 5-325 | 22 days supply | Qty: 45 | Fill #0

## 2020-07-04 NOTE — Telephone Encounter (Signed)
oxyCODONE-acetaminophen (PERCOCET/ROXICET) 5-325 MG tablet, REFILL REQUEST @  [facility] [place], [place] - [address] [place] Phone:  [phone]  Fax:  [phone]

## 2020-07-08 NOTE — Progress Notes (Signed)
  [facility] Internal Medicine Residency Telephone Encounter Continuity Care Appointment  HPI:   This telephone encounter was created for Sonya Smith on 07/08/2020 for the following purpose/cc sneezing, cough, congestion.    Past Medical History:  Past Medical History:  Diagnosis Date  . Anxiety 10/09/2010  . Arthritis    of cervical and lumbar spine  . Asthma    undiagnosed, was told by a physician in the ED, patient does not take any inhalers at home.   . Chest pain    multiple ED visit, no objective evidence of cardiac or pulmonary causes  of chest pain in past  . Hypertension 04/06/2019  . Restless leg syndrome 10/14/2017      ROS:  Review of Systems  Constitutional: Positive for chills. Negative for fever and weight loss.  HENT: Positive for congestion. Negative for sinus pain and sore throat.   Eyes: Negative for blurred vision and double vision.  Respiratory: Positive for cough and sputum production. Negative for shortness of breath.   Cardiovascular: Positive for chest pain. Negative for palpitations.  Gastrointestinal: Negative for abdominal pain, diarrhea, nausea and vomiting.  Musculoskeletal: Positive for myalgias.  Neurological: Negative for dizziness, loss of consciousness and headaches.  All other systems reviewed and are negative.      Assessment / Plan / Recommendations:   Please see A&P under problem oriented charting for assessment of the patient's acute and chronic medical conditions.   As always, pt is advised that if symptoms worsen or new symptoms arise, they should go to an urgent care facility or to to ER for further evaluation.   Consent and Medical Decision Making:   Patient discussed with Dr. Oswaldo Done  This is a telephone encounter between Sonya Smith and Sonya Smith on 07/08/2020 for sneezing, cough, congestion. The visit was conducted with the patient located at home and Sonya Smith at [facility]. The patient's identity was confirmed using  their DOB and current address. The patient has consented to being evaluated through a telephone encounter and understands the associated risks (an examination cannot be done and the patient may need to come in for an appointment) / benefits (allows the patient to remain at home, decreasing exposure to coronavirus). I personally spent 12 minutes on medical discussion.

## 2020-07-09 ENCOUNTER — Other Ambulatory Visit: Payer: Self-pay

## 2020-07-09 ENCOUNTER — Encounter: Payer: Self-pay | Admitting: Student

## 2020-07-09 ENCOUNTER — Other Ambulatory Visit: Payer: Self-pay | Admitting: Student

## 2020-07-09 ENCOUNTER — Ambulatory Visit (INDEPENDENT_AMBULATORY_CARE_PROVIDER_SITE_OTHER): Payer: Self-pay | Admitting: Student

## 2020-07-09 ENCOUNTER — Telehealth: Payer: Self-pay

## 2020-07-09 DIAGNOSIS — J069 Acute upper respiratory infection, unspecified: Secondary | ICD-10-CM | POA: Insufficient documentation

## 2020-07-09 MED ORDER — CROMOLYN SODIUM 5.2 MG/ACT NA AERS: 1.0000 | INHALATION_SPRAY | Freq: Four times a day (QID) | NASAL | 0 refills | Status: DC

## 2020-07-09 MED FILL — CROMOLYN SODIUM NASAL SPRAY: 5.2 | 25 days supply | Qty: 26 | Fill #0

## 2020-07-09 NOTE — Assessment & Plan Note (Signed)
History of asthma, mixed connective tissue disorder, former smoker. Patient complains of 2 weeks of cough, congestion, sneezing. She has pain with coughing which has improved. The cough produced green phlegm last week but has since become clear. Endorses chills but has been checking temperature and has not had fever. No known sick contacts. Unvaccinated against COVID-19 without any known exposures. Denies fever, sore throat, loss of taste or smell. Unable to evaluate lung sounds as this was a telehealth visit.  -given phone number to schedule COVID test -hold off on CXR given lack of insurance -treat as URI: continue home robitussin, start zyrtec, start cromolyn nasal spray for excessive sneezing

## 2020-07-09 NOTE — Telephone Encounter (Signed)
Return pt's call - states she just talked to the doctor and he had ordered medication. Inform Dr Imogene Burn ordered Cromolyn nasal spray. "treat as URI: continue home robitussin, start zyrtec, start cromolyn nasal spray for excessive sneezing" She states he was going to order an antihistamine; inform of Zyrtec, which she thought was a nasal spray. Inform zyrtec is a pill which she checked and has some at home. And the nasal spray has to be ordered per the pharmacy.

## 2020-07-09 NOTE — Telephone Encounter (Signed)
Pls contact pt [phone] regarding medicine

## 2020-07-10 NOTE — Progress Notes (Signed)
Internal Medicine Clinic Attending  Case discussed with Dr. Chen  At the time of the visit.  We reviewed the resident's history and exam and pertinent patient test results.  I agree with the assessment, diagnosis, and plan of care documented in the resident's note. 

## 2020-08-01 ENCOUNTER — Other Ambulatory Visit: Payer: Self-pay

## 2020-08-01 ENCOUNTER — Other Ambulatory Visit: Payer: Self-pay | Admitting: Internal Medicine

## 2020-08-01 DIAGNOSIS — Z79891 Long term (current) use of opiate analgesic: Secondary | ICD-10-CM

## 2020-08-01 DIAGNOSIS — M351 Other overlap syndromes: Secondary | ICD-10-CM

## 2020-08-01 MED ORDER — OXYCODONE-ACETAMINOPHEN 5-325 MG PO TABS: 1.0000 | ORAL_TABLET | Freq: Every day | ORAL | 0 refills | Status: DC | PRN

## 2020-08-01 MED FILL — OXYCODONE-APAP 5-325MG: 5-325 | 22 days supply | Qty: 45 | Fill #0

## 2020-08-01 NOTE — Telephone Encounter (Signed)
Pt is requesting her oxyCODONE-acetaminophen (PERCOCET/ROXICET) 5-325 MG tablet sent to  [facility] - [place], [place] - [address] [place] Phone:  [phone]  Fax:  [phone]     ( pt has two pills left )

## 2020-08-30 ENCOUNTER — Other Ambulatory Visit: Payer: Self-pay | Admitting: Internal Medicine

## 2020-08-30 ENCOUNTER — Other Ambulatory Visit: Payer: Self-pay

## 2020-08-30 DIAGNOSIS — M351 Other overlap syndromes: Secondary | ICD-10-CM

## 2020-08-30 DIAGNOSIS — Z79891 Long term (current) use of opiate analgesic: Secondary | ICD-10-CM

## 2020-08-30 MED ORDER — OXYCODONE-ACETAMINOPHEN 5-325 MG PO TABS: 1.0000 | ORAL_TABLET | Freq: Every day | ORAL | 0 refills | Status: DC | PRN

## 2020-08-30 NOTE — Telephone Encounter (Signed)
Need refill on oxyCODONE-acetaminophen (PERCOCET/ROXICET) 5-325 MG tablet  ;pt contact [phone]   [facility] - [place], [place] - [address] [place]

## 2020-08-30 NOTE — Telephone Encounter (Signed)
Last office visit:  07/09/20 telehealth  04/22/20 w/pcp Last UDS: 04/08/2020 Last Refill: last written 08/01/20 #45 Next appt: none scheduled-will send request to front office

## 2020-08-31 ENCOUNTER — Other Ambulatory Visit (HOSPITAL_COMMUNITY): Payer: Self-pay

## 2020-08-31 MED FILL — Oxycodone w/ Acetaminophen Tab 5-325 MG: ORAL | 22 days supply | Qty: 45 | Fill #0 | Status: AC

## 2020-09-02 ENCOUNTER — Emergency Department (HOSPITAL_COMMUNITY)
Admission: EM | Admit: 2020-09-02 | Discharge: 2020-09-02 | Disposition: A | Discharge status: Home / Self Care | Payer: Self-pay | Attending: Emergency Medicine | Admitting: Emergency Medicine

## 2020-09-02 ENCOUNTER — Encounter (HOSPITAL_COMMUNITY): Payer: Self-pay | Admitting: Radiology

## 2020-09-02 ENCOUNTER — Other Ambulatory Visit: Payer: Self-pay

## 2020-09-02 ENCOUNTER — Emergency Department (HOSPITAL_COMMUNITY): Payer: Self-pay

## 2020-09-02 DIAGNOSIS — J45909 Unspecified asthma, uncomplicated: Secondary | ICD-10-CM | POA: Insufficient documentation

## 2020-09-02 DIAGNOSIS — Z87891 Personal history of nicotine dependence: Secondary | ICD-10-CM | POA: Insufficient documentation

## 2020-09-02 DIAGNOSIS — Z20822 Contact with and (suspected) exposure to covid-19: Secondary | ICD-10-CM | POA: Insufficient documentation

## 2020-09-02 DIAGNOSIS — J36 Peritonsillar abscess: Secondary | ICD-10-CM

## 2020-09-02 DIAGNOSIS — I10 Essential (primary) hypertension: Secondary | ICD-10-CM | POA: Insufficient documentation

## 2020-09-02 LAB — CBC WITH DIFFERENTIAL/PLATELET
Abs Immature Granulocytes: 0.02 10*3/uL (ref 0.00–0.07)
Basophils Absolute: 0.1 10*3/uL (ref 0.0–0.1)
Basophils Relative: 1 %
Eosinophils Absolute: 0.2 10*3/uL (ref 0.0–0.5)
Eosinophils Relative: 2 %
HCT: 45.4 % (ref 36.0–46.0)
Hemoglobin: 15.2 g/dL — ABNORMAL HIGH (ref 12.0–15.0)
Immature Granulocytes: 0 %
Lymphocytes Relative: 21 %
Lymphs Abs: 1.9 10*3/uL (ref 0.7–4.0)
MCH: 30.3 pg (ref 26.0–34.0)
MCHC: 33.5 g/dL (ref 30.0–36.0)
MCV: 90.6 fL (ref 80.0–100.0)
Monocytes Absolute: 0.7 10*3/uL (ref 0.1–1.0)
Monocytes Relative: 7 %
Neutro Abs: 6.2 10*3/uL (ref 1.7–7.7)
Neutrophils Relative %: 69 %
Platelets: 229 10*3/uL (ref 150–400)
RBC: 5.01 MIL/uL (ref 3.87–5.11)
RDW: 13.2 % (ref 11.5–15.5)
WBC: 9 10*3/uL (ref 4.0–10.5)
nRBC: 0 % (ref 0.0–0.2)

## 2020-09-02 LAB — BASIC METABOLIC PANEL
Anion gap: 10 (ref 5–15)
BUN: 13 mg/dL (ref 6–20)
CO2: 26 mmol/L (ref 22–32)
Calcium: 9.3 mg/dL (ref 8.9–10.3)
Chloride: 101 mmol/L (ref 98–111)
Creatinine, Ser: 0.79 mg/dL (ref 0.44–1.00)
GFR, Estimated: >60 mL/min (ref >60)
Glucose, Bld: 177 mg/dL — ABNORMAL HIGH (ref 70–99)
Potassium: 4.2 mmol/L (ref 3.5–5.1)
Sodium: 137 mmol/L (ref 135–145)

## 2020-09-02 LAB — POC SARS CORONAVIRUS 2 AG -  ED: SARS Coronavirus 2 Ag: NEGATIVE

## 2020-09-02 MED ORDER — CLINDAMYCIN PHOSPHATE 900 MG/50ML IV SOLN
900.0000 mg | Freq: Once | INTRAVENOUS | Status: AC
  Administered 2020-09-02: 900 mg via INTRAVENOUS
  Filled 2020-09-02: qty 50

## 2020-09-02 MED ORDER — IOHEXOL 300 MG/ML  SOLN
75.0000 mL | Freq: Once | INTRAMUSCULAR | Status: AC | PRN
  Administered 2020-09-02: 75 mL via INTRAVENOUS

## 2020-09-02 MED ORDER — CLINDAMYCIN HCL 300 MG PO CAPS: 300.0000 mg | ORAL_CAPSULE | Freq: Three times a day (TID) | ORAL | 0 refills | Status: AC

## 2020-09-02 MED ORDER — MORPHINE SULFATE (PF) 4 MG/ML IV SOLN
4.0000 mg | Freq: Once | INTRAVENOUS | Status: AC
  Administered 2020-09-02: 4 mg via INTRAVENOUS
  Filled 2020-09-02: qty 1

## 2020-09-02 MED ORDER — ONDANSETRON HCL 4 MG/2ML IJ SOLN
4.0000 mg | Freq: Once | INTRAMUSCULAR | Status: AC
  Administered 2020-09-02: 4 mg via INTRAVENOUS
  Filled 2020-09-02: qty 2

## 2020-09-02 MED ORDER — METHYLPREDNISOLONE 4 MG PO TBPK: ORAL_TABLET | Freq: Three times a day (TID) | ORAL | 0 refills | Status: DC

## 2020-09-02 MED ORDER — OXYCODONE HCL 5 MG PO TABS
5.0000 mg | ORAL_TABLET | Freq: Once | ORAL | Status: AC
  Administered 2020-09-02: 5 mg via ORAL
  Filled 2020-09-02: qty 1

## 2020-09-02 MED ORDER — SODIUM CHLORIDE 0.9 % IV SOLN
10.0000 mg | Freq: Once | INTRAVENOUS | Status: AC
  Administered 2020-09-02: 10 mg via INTRAVENOUS
  Filled 2020-09-02 (×2): qty 1

## 2020-09-02 NOTE — ED Triage Notes (Signed)
Emergency Medicine Provider Triage Evaluation Note  Sonya Smith , a 60 y.o. female  was evaluated in triage.  Pt complains of left sided throat pain since thursday. No fevers.  Able to swallow but hurts a lot.   Review of Systems  Positive: Sore throat Negative: fever  Physical Exam  BP (!) 155/83 (BP Location: Left Arm)   Pulse 78   Temp 98.3 F (36.8 C) (Oral)   Resp 16   LMP 07/07/2015   SpO2 90%  Gen:   Awake, no distress   HEENT:  Atraumatic, trismus limits exam Resp:  Normal effort  Cardiac:  Normal rate  Abd:   Nondistended, nontender  MSK:   Moves extremities without difficulty  Neuro:  Speech clear   Medical Decision Making  Medically screening exam initiated at 1:41 PM.  Appropriate orders placed.  [name] was informed that the remainder of the evaluation will be completed by another provider, this initial triage assessment does not replace that evaluation, and the importance of remaining in the ED until their evaluation is complete.  Clinical Impression  Left sided sore throat.    Cristina Gong, [place] 09/02/20 1344

## 2020-09-02 NOTE — Discharge Instructions (Signed)
Please take your antibitotic (Clindamycin) and your antiswelling (solumedrol) medications three times daily for the next week. Follow up with your PCP within 72 hours.

## 2020-09-02 NOTE — ED Triage Notes (Signed)
Pt reports swelling to L neck progressing since Thursday. Now having difficulty swallowing. Hx of same with need for drainage in the past. Denies fevers or chills.

## 2020-09-02 NOTE — ED Notes (Signed)
Pt able to speak in complete sentences. Pt has non labored breathing.

## 2020-09-02 NOTE — ED Provider Notes (Signed)
[facility] EMERGENCY DEPARTMENT Provider Note   CSN: [id number] Arrival date & time: 09/02/20  1315     History No chief complaint on file.   Sonya Smith is a 60 y.o. female.  HPI Patient is a 60 y.o. female here with a chief complaint of left sided throat swelling since last Thursday. She has a history of peritonsilar abscess that she has needed drainage for in the past. Patient endorsing subjective fevers but denies n/v/d. Patient voice is at normal and denies airway concern.  She is otherwise healthy and denies other symptoms at this time.     Past Medical History:  Diagnosis Date  . Anxiety 10/09/2010  . Arthritis    of cervical and lumbar spine  . Asthma    undiagnosed, was told by a physician in the ED, patient does not take any inhalers at home.   . Chest pain    multiple ED visit, no objective evidence of cardiac or pulmonary causes  of chest pain in past  . Hypertension 04/06/2019  . Restless leg syndrome 10/14/2017    Patient Active Problem List   Diagnosis Date Noted  . Peritonsillar abscess   . Upper respiratory tract infection 07/09/2020  . Venous stasis of both lower extremities 01/19/2020  . Hypertension 04/06/2019  . Pleuritic chest pain 04/06/2019  . Healthcare maintenance 10/14/2017  . Chronic use of opiate drug for therapeutic purpose 06/10/2016  . Mixed connective tissue disease (HCC) 01/07/2016    Past Surgical History:  Procedure Laterality Date  . drainage of peri tonsillar abscess    . peri tonsillar abscess  10/11   drainage     OB History   No obstetric history on file.     Family History  Problem Relation Age of Onset  . Diabetes Mother   . Stroke Maternal Aunt        36s  . Cancer Maternal Uncle        recurrent  . Hypertension Sister     Social History   Tobacco Use  . Smoking status: Former Smoker    Packs/day: 1.00    Years: 30.00    Pack years: 30.00    Types: Cigarettes    Quit date: 10/09/2003     Years since quitting: 16.9  . Smokeless tobacco: Never Used  Vaping Use  . Vaping Use: Never used  Substance Use Topics  . Alcohol use: No    Alcohol/week: 0.0 standard drinks  . Drug use: Yes    Comment: occassional marijuana    Home Medications Prior to Admission medications   Medication Sig Start Date End Date Taking? Authorizing Provider  aspirin EC 81 MG tablet Take 81 mg by mouth daily as needed for mild pain.   Yes [provider]  diclofenac sodium (VOLTAREN) 1 % GEL Apply 2 g topically 4 (four) times daily as needed (muscle pain).   Yes [provider]  ibuprofen (ADVIL) 200 MG tablet Take 400 mg by mouth every 6 (six) hours as needed for moderate pain.   Yes [provider]  oxyCODONE-acetaminophen (PERCOCET/ROXICET) 5-325 MG tablet TAKE 1-2 TABLETS BY MOUTH DAILY AS NEEDED FOR SEVERE PAIN. Patient taking differently: Take 1-2 tablets by mouth as needed for severe pain. 08/30/20 02/26/21 Yes Theotis Barrio, MD  cromolyn (NASALCROM) 5.2 MG/ACT nasal spray PLACE 1 SPRAY INTO BOTH NOSTRILS 4 TIMES DAILY. Patient not taking: Reported on 09/02/2020 07/09/20 07/09/21  Remo Lipps, MD  oxyCODONE-acetaminophen (PERCOCET/ROXICET) [phone]  MG tablet TAKE 1 TO 2 TABLETS BY MOUTH DAILY AS NEEDED FOR SEVERE PAIN. EFFECTIVE DATE 04/01/20. Patient not taking: No sig reported 03/29/20 09/25/20  Eliezer Bottom, MD    Allergies    Meloxicam, Codeine, and Darvocet [propoxyphene n-acetaminophen]  Review of Systems   Review of Systems  Constitutional: Positive for fever. Negative for chills.  HENT: Positive for trouble swallowing. Negative for ear pain and sore throat.   Eyes: Negative for pain and visual disturbance.  Respiratory: Negative for cough and shortness of breath.   Cardiovascular: Negative for chest pain and palpitations.  Gastrointestinal: Negative for abdominal pain and vomiting.  Genitourinary: Negative for dysuria and hematuria.  Musculoskeletal: Negative  for arthralgias and back pain.  Skin: Negative for color change and rash.  Neurological: Negative for seizures and syncope.  All other systems reviewed and are negative.   Physical Exam Updated Vital Signs BP 116/75   Pulse (!) 58   Temp 98.3 F (36.8 C) (Oral)   Resp 16   LMP 07/07/2015   SpO2 98%   Physical Exam Vitals and nursing note reviewed.  Constitutional:      General: She is not in acute distress.    Appearance: She is well-developed. She is obese.  HENT:     Head: Normocephalic and atraumatic.     Nose: No congestion.     Mouth/Throat:     Comments: Right sided deviation of the uvula. Concern for swelling of the posterior oropharynx.  Eyes:     Conjunctiva/sclera: Conjunctivae normal.  Cardiovascular:     Rate and Rhythm: Normal rate and regular rhythm.     Heart sounds: No murmur heard.   Pulmonary:     Effort: Pulmonary effort is normal. No respiratory distress.     Breath sounds: Normal breath sounds.  Abdominal:     Palpations: Abdomen is soft.     Tenderness: There is no abdominal tenderness.  Musculoskeletal:     Cervical back: Neck supple.  Skin:    General: Skin is warm and dry.  Neurological:     Mental Status: She is alert.     ED Results / Procedures / Treatments   Labs (all labs ordered are listed, but only abnormal results are displayed) Labs Reviewed  CBC WITH DIFFERENTIAL/PLATELET - Abnormal; Notable for the following components:      Result Value   Hemoglobin 15.2 (*)    All other components within normal limits  BASIC METABOLIC PANEL - Abnormal; Notable for the following components:   Glucose, Bld 177 (*)    All other components within normal limits  POC SARS CORONAVIRUS 2 AG -  ED    Medications Ordered in ED Medications  dexamethasone (DECADRON) 10 mg in sodium chloride 0.9 % 50 mL IVPB (has no administration in time range)  morphine 4 MG/ML injection 4 mg (has no administration in time range)  ondansetron (ZOFRAN)  injection 4 mg (has no administration in time range)  oxyCODONE (Oxy IR/ROXICODONE) immediate release tablet 5 mg (5 mg Oral Given 09/02/20 1544)  iohexol (OMNIPAQUE) 300 MG/ML solution 75 mL (75 mLs Intravenous Contrast Given 09/02/20 1727)  clindamycin (CLEOCIN) IVPB 900 mg (0 mg Intravenous Stopped 09/02/20 2006)    ED Course  I have reviewed the triage vital signs and the nursing notes.  Pertinent labs & imaging results that were available during my care of the patient were reviewed by me and considered in my medical decision making (see chart for details).  MDM Rules/Calculators/A&P Patient's HPI and PE findings are most consistent with a peritonsillar abscess. Due to history of re-accumulation and slightly abnormal position of discomfort, proceeded with CT soft tissue with no other abnormalities appreciated. ENT consulted who performed bedside  I&D with purulence expressed. She is symptomatically improved following the procedure. Treated with ambulatory clindamycin and solumedrol on recommendations of ENT.   Disposition: Based on the above findings, I believe patient is stable for discharge.   Patient/family educated about specific return precautions for given chief complaint and symptoms.  Patient/family educated about follow-up with PCP.  Patient/family expressed understanding of return precautions and need for follow-up. Patient spoken to regarding all imaging and laboratory results and appropriate follow up for these results. All education provided in verbal form with additional information in written form. Time was allowed for answering of patient questions. Patient discharged.   Emergency Department Medication Summary: Medications  dexamethasone (DECADRON) 10 mg in sodium chloride 0.9 % 50 mL IVPB (has no administration in time range)  morphine 4 MG/ML injection 4 mg (has no administration in time range)  ondansetron (ZOFRAN) injection 4 mg (has no administration in time range)   oxyCODONE (Oxy IR/ROXICODONE) immediate release tablet 5 mg (5 mg Oral Given 09/02/20 1544)  iohexol (OMNIPAQUE) 300 MG/ML solution 75 mL (75 mLs Intravenous Contrast Given 09/02/20 1727)  clindamycin (CLEOCIN) IVPB 900 mg (0 mg Intravenous Stopped 09/02/20 2006)     Final Clinical Impression(s) / ED Diagnoses Final diagnoses:  None    Rx / DC Orders ED Discharge Orders    None       Glyn Ade, MD 09/02/20 2205    Tegeler, Canary Brim, MD 09/03/20 [phone]

## 2020-09-02 NOTE — Consult Note (Signed)
Reason for Consult: Throat pain Referring Physician: ER MD  Sonya Smith is an 60 y.o. female with a 5-day history of worsening left-sided throat pain associated with otalgia and dysphagia.  She has a history of left peritonsillar abscess treated with needle drainage several years ago.  She denies any easy bruising or bleeding disorders.  She reports no allergies to lidocaine  Past Medical History:  Diagnosis Date  . Anxiety 10/09/2010  . Arthritis    of cervical and lumbar spine  . Asthma    undiagnosed, was told by a physician in the ED, patient does not take any inhalers at home.   . Chest pain    multiple ED visit, no objective evidence of cardiac or pulmonary causes  of chest pain in past  . Hypertension 04/06/2019  . Restless leg syndrome 10/14/2017    Past Surgical History:  Procedure Laterality Date  . drainage of peri tonsillar abscess    . peri tonsillar abscess  10/11   drainage    Family History  Problem Relation Age of Onset  . Diabetes Mother   . Stroke Maternal Aunt        57s  . Cancer Maternal Uncle        recurrent  . Hypertension Sister     Social History:  reports that she quit smoking about 16 years ago. Her smoking use included cigarettes. She has a 30.00 pack-year smoking history. She has never used smokeless tobacco. She reports current drug use. She reports that she does not drink alcohol.  Allergies:  Allergies  Allergen Reactions  . Meloxicam Shortness Of Breath and Other (See Comments)  . Codeine Nausea And Vomiting    "bad thoughts"  . Darvocet [Propoxyphene N-Acetaminophen] Nausea And Vomiting    Medications: I have reviewed the patient's current medications. Prior to Admission: (Not in a hospital admission)   Results for orders placed or performed during the hospital encounter of 09/02/20 (from the past 48 hour(s))  CBC with Differential     Status: Abnormal   Collection Time: 09/02/20  1:44 PM  Result Value Ref Range   WBC 9.0 4.0 -  10.5 K/uL   RBC 5.01 3.87 - 5.11 MIL/uL   Hemoglobin 15.2 (H) 12.0 - 15.0 g/dL   HCT 99.8 33.8 - 25.0 %   MCV 90.6 80.0 - 100.0 fL   MCH 30.3 26.0 - 34.0 pg   MCHC 33.5 30.0 - 36.0 g/dL   RDW 53.9 76.7 - 34.1 %   Platelets 229 150 - 400 K/uL   nRBC 0.0 0.0 - 0.2 %   Neutrophils Relative % 69 %   Neutro Abs 6.2 1.7 - 7.7 K/uL   Lymphocytes Relative 21 %   Lymphs Abs 1.9 0.7 - 4.0 K/uL   Monocytes Relative 7 %   Monocytes Absolute 0.7 0.1 - 1.0 K/uL   Eosinophils Relative 2 %   Eosinophils Absolute 0.2 0.0 - 0.5 K/uL   Basophils Relative 1 %   Basophils Absolute 0.1 0.0 - 0.1 K/uL   Immature Granulocytes 0 %   Abs Immature Granulocytes 0.02 0.00 - 0.07 K/uL    Comment: Performed at [facility], [address] [place], [place] [address]  Basic metabolic panel     Status: Abnormal   Collection Time: 09/02/20  1:44 PM  Result Value Ref Range   Sodium 137 135 - 145 mmol/L   Potassium 4.2 3.5 - 5.1 mmol/L   Chloride 101 98 - 111  mmol/L   CO2 26 22 - 32 mmol/L   Glucose, Bld 177 (H) 70 - 99 mg/dL    Comment: Glucose reference range applies only to samples taken after fasting for at least 8 hours.   BUN 13 6 - 20 mg/dL   Creatinine, Ser 5.40 0.44 - 1.00 mg/dL   Calcium 9.3 8.9 - 08.6 mg/dL   GFR, Estimated >76 >19 mL/min    Comment: (NOTE) Calculated using the CKD-EPI Creatinine Equation (2021)    Anion gap 10 5 - 15    Comment: Performed at [facility], [address] [place], [place] [address]    CT Soft Tissue Neck W Contrast  Result Date: 09/02/2020 CLINICAL DATA:  Left-sided neck pain with tonsillar swelling and difficulty swallowing. EXAM: CT NECK WITH CONTRAST TECHNIQUE: Multidetector CT imaging of the neck was performed using the standard protocol following the bolus administration of intravenous contrast. CONTRAST:  4mL OMNIPAQUE IOHEXOL 300 MG/ML  SOLN COMPARISON:  10/05/2011 FINDINGS: Pharynx and larynx: Peritonsillar abscess on the left measuring up to  2.5 cm in diameter. No evidence of extension into the parapharyngeal space. Salivary glands: Parotid and submandibular glands are normal. Thyroid: Normal Lymph nodes: No enlarged or low-density nodes on either side of the neck. Vascular: Normal Limited intracranial: Normal Visualized orbits: Normal Mastoids and visualized paranasal sinuses: Clear Skeleton: Ordinary spondylosis C5-6 and C6-7. Upper chest: Minimal pulmonary scarring.  No active process. Other: None IMPRESSION: Peritonsillar abscess on the left measuring up to 2.5 cm in diameter. No evidence of extension into the parapharyngeal space at this time. Electronically Signed   By: Paulina Fusi M.D.   On: 09/02/2020 17:41    Review of Systems Blood pressure 116/75, pulse (!) 58, temperature 98.3 F (36.8 C), temperature source Oral, resp. rate 16, last menstrual period 07/07/2015, SpO2 98 %. Physical Exam  Left peritonsillar swelling suggestive of a peritonsillar abscess/airway widely patent/tongue and FOM normal Neck normal  Assessment/Plan:  CT scan and exam point towards a left peritonsillar abscess  After informed consent was obtained, the peritonsillar space was anesthetized using 1% lidocaine with 1 100,000 epinephrine.  A small incision was made in the soft palate.  Exploration of the peritonsillar space resulted in opening of an abscess pocket.  The patient was asked to gargle with half-strength hydrogen peroxide and water until bleeding was controlled and wound cleaned.  1.  Clindamycin 300 mg p.o. 4 times daily x 7 days  2.  Medrol Dosepak x 5 days  3.  Follow-up with ENT 1 week (sooner if not better) -this is the second peritonsillar abscess on the same side.  The patient probably needs her tonsils removed.  I have recommended that she set up a follow-up visit for consult as an outpatient.   Rejeana Brock 09/02/2020, 7:02 PM

## 2020-09-03 ENCOUNTER — Other Ambulatory Visit (HOSPITAL_COMMUNITY): Payer: Self-pay

## 2020-09-03 MED ORDER — METHYLPREDNISOLONE 4 MG PO TBPK
ORAL_TABLET | ORAL | 0 refills | Status: DC
  Filled 2020-09-03: qty 21, 6d supply, fill #0

## 2020-09-03 MED ORDER — CLINDAMYCIN HCL 300 MG PO CAPS
ORAL_CAPSULE | ORAL | 0 refills | Status: DC
  Filled 2020-09-03: qty 21, 7d supply, fill #0

## 2020-09-26 ENCOUNTER — Telehealth: Payer: Self-pay

## 2020-09-26 NOTE — Telephone Encounter (Signed)
Pt stated someone had call her and she could not understand the message; stated call was from [phone] # and was about a minute long. No note in chart. Pt aware of her appt on 5/3. Informed pt I will send a message to her doctor to see if he or team member had called.

## 2020-09-26 NOTE — Telephone Encounter (Signed)
Pls contact pt [phone]

## 2020-09-26 NOTE — Telephone Encounter (Signed)
None of [place] called. Maybe appointment reminder?

## 2020-10-01 ENCOUNTER — Ambulatory Visit (INDEPENDENT_AMBULATORY_CARE_PROVIDER_SITE_OTHER): Payer: Self-pay | Admitting: Student

## 2020-10-01 ENCOUNTER — Encounter: Payer: Self-pay | Admitting: Student

## 2020-10-01 ENCOUNTER — Other Ambulatory Visit (HOSPITAL_COMMUNITY): Payer: Self-pay

## 2020-10-01 ENCOUNTER — Other Ambulatory Visit: Payer: Self-pay

## 2020-10-01 VITALS — BP 151/78 | HR 71 | Temp 98.1°F | Ht 64.0 in | Wt 242.4 lb

## 2020-10-01 DIAGNOSIS — G8929 Other chronic pain: Secondary | ICD-10-CM | POA: Insufficient documentation

## 2020-10-01 DIAGNOSIS — M549 Dorsalgia, unspecified: Secondary | ICD-10-CM

## 2020-10-01 DIAGNOSIS — I10 Essential (primary) hypertension: Secondary | ICD-10-CM

## 2020-10-01 DIAGNOSIS — M351 Other overlap syndromes: Secondary | ICD-10-CM

## 2020-10-01 DIAGNOSIS — J36 Peritonsillar abscess: Secondary | ICD-10-CM

## 2020-10-01 DIAGNOSIS — Z79891 Long term (current) use of opiate analgesic: Secondary | ICD-10-CM

## 2020-10-01 MED ORDER — OXYCODONE-ACETAMINOPHEN 5-325 MG PO TABS
ORAL_TABLET | ORAL | 0 refills | Status: DC
  Filled 2020-10-01: qty 45, 22d supply, fill #0

## 2020-10-01 MED ORDER — CYCLOBENZAPRINE HCL 5 MG PO TABS
5.0000 mg | ORAL_TABLET | Freq: Every day | ORAL | 0 refills | Status: DC
  Filled 2020-10-01 (×2): qty 3, 3d supply, fill #0

## 2020-10-01 NOTE — Patient Instructions (Signed)
Thank you, Ms.[name] for allowing [place] to provide your care today. Today we discussed  Mixed connective tissue disorder Please follow-up with rheumatology, we are placing the referral and you will receive phone call.  Neck Pain Please apply cool compress and take muscle relaxer for 3 days.  This muscle relaxer may make you sleepy and or dizzy, please only take at nighttime when going to bed.  If his pain does not improve within the next week or so, please call our clinic.  Peritonsillar Abscess Please follow-up with ENT   I have ordered the following labs for you:  Lab Orders  No laboratory test(s) ordered today     Tests ordered today:  None  Referrals ordered today:    Referral Orders     Ambulatory referral to Rheumatology     Ambulatory referral to ENT   I have ordered the following medication/changed the following medications:   Stop the following medications: There are no discontinued medications.   Start the following medications: Meds ordered this encounter  Medications  . cyclobenzaprine (FLEXERIL) 5 MG tablet    Sig: Take 1 tablet (5 mg total) by mouth at bedtime.    Dispense:  3 tablet    Refill:  0     Follow up: 6 months    Remember: If your neck/upper back pain does not improve, please call our clinic!  Should you have any questions or concerns please call the internal medicine clinic at [phone].     Thalia Bloodgood, D.O. [facility]

## 2020-10-01 NOTE — Assessment & Plan Note (Addendum)
Assessment: Patient with upper back pain, centrally located around C5.  There is some mild swelling but no erythema, warmth.  The area is exquisitely tender to palpate.  I do believe this consistent with muscle strain.  Pain is been present for 2 weeks and she denies any improvement or worsening of the pain.  Ice packs did help the pain.  Is worse with certain movements such as flexion and scapular retraction.  Do not believe that this is cellulitis or an abscess.  Do not believe she has had any bony fractures.  Discussed with patient to continue using ice.  Will prescribe Flexeril for 3 days 5 mg at bedtime.  Discussed with her that this may cause her to be drowsy and and that she needs to be extra careful when ambulating after taking this medication along with her Percocets.  Also instructed her to apply Bengay or icy hot to the area.  Plan: -Flexeril 5 mg nightly for 3 days, ibuprofen, IcyHot/Bengay -Instructed patient to return to clinic if no improvement within the next week.

## 2020-10-01 NOTE — Assessment & Plan Note (Addendum)
Assessment: Patient is qualified for orange card, will place referral for rheumatology.  Please see prior documentation for full evaluation and assessment.  Patient denies any worsening of her chronic pain states that is fairly well managed on Percocet.  ESR and CRP were negative during last visit.  Because we are placing referral for rheumatology I will wait on ordering myositis panel and ANA.  Plan: -Rheumatology referral in place

## 2020-10-01 NOTE — Progress Notes (Signed)
CC: Chronic pain, acute upper back pain  HPI:  Sonya Smith is a 60 y.o. female with a past medical history stated below and presents today for management of her chronic pain as well as her acute upper back pain. Please see problem based assessment and plan for additional details.  Past Medical History:  Diagnosis Date  . Anxiety 10/09/2010  . Arthritis    of cervical and lumbar spine  . Asthma    undiagnosed, was told by a physician in the ED, patient does not take any inhalers at home.   . Chest pain    multiple ED visit, no objective evidence of cardiac or pulmonary causes  of chest pain in past  . Hypertension 04/06/2019  . Restless leg syndrome 10/14/2017    Current Outpatient Medications on File Prior to Visit  Medication Sig Dispense Refill  . aspirin EC 81 MG tablet Take 81 mg by mouth daily as needed for mild pain.    . clindamycin (CLEOCIN) 300 MG capsule Take 1 capsule by mouth 3 times a day for 7 days 21 capsule 0  . cromolyn (NASALCROM) 5.2 MG/ACT nasal spray PLACE 1 SPRAY INTO BOTH NOSTRILS 4 TIMES DAILY. (Patient not taking: Reported on 09/02/2020) 26 mL 0  . diclofenac sodium (VOLTAREN) 1 % GEL Apply 2 g topically 4 (four) times daily as needed (muscle pain).    Marland Kitchen ibuprofen (ADVIL) 200 MG tablet Take 400 mg by mouth every 6 (six) hours as needed for moderate pain.    . methylPREDNISolone (MEDROL DOSEPAK) 4 MG TBPK tablet Take by mouth 3 x daily with food for 7 days. 5 each 0  . methylPREDNISolone (MEDROL DOSEPAK) 4 MG TBPK tablet Take as directed and follow package instructions. 21 tablet 0   No current facility-administered medications on file prior to visit.    Family History  Problem Relation Age of Onset  . Diabetes Mother   . Stroke Maternal Aunt        35s  . Cancer Maternal Uncle        recurrent  . Hypertension Sister     Social History   Socioeconomic History  . Marital status: Divorced    Spouse name: Not on file  . Number of children: Not  on file  . Years of education: Not on file  . Highest education level: Not on file  Occupational History  . Not on file  Tobacco Use  . Smoking status: Former Smoker    Packs/day: 1.00    Years: 30.00    Pack years: 30.00    Types: Cigarettes    Quit date: 10/09/2003    Years since quitting: 16.9  . Smokeless tobacco: Never Used  Vaping Use  . Vaping Use: Never used  Substance and Sexual Activity  . Alcohol use: No    Alcohol/week: 0.0 standard drinks  . Drug use: Yes    Comment: occassional marijuana  . Sexual activity: Not Currently  Other Topics Concern  . Not on file  Social History Narrative   Lives in [place], [place] with her boy friend who is disabled. 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 She has a daughter who is independent and provides her money for medicines. 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  No health insurance.    Social Determinants of Health   Financial Resource Strain: Not on file  Food Insecurity: Not on file  Transportation Needs: Not on file  Physical Activity: Not on file  Stress: Not on file  Social Connections: Not on file  Intimate Partner Violence: Not on file    Review of Systems: ROS negative except for what is noted on the assessment and plan.  Vitals:   10/01/20 1518  BP: (!) 151/78  Pulse: 71  Temp: 98.1 F (36.7 C)  TempSrc: Oral  SpO2: 95%  Weight: 242 lb 6.4 oz (110 kg)  Height: 5\' 4"  (1.626 m)     Physical Exam: Constitutional: well-appearing, no acute distress HENT: normocephalic atraumatic, mucous membranes moist, peritonsillar regions examined, without erythema, drainage or wound present Eyes: conjunctiva non-erythematous Neck: supple Cardiovascular: regular rate and rhythm, no m/r/g Pulmonary/Chest: normal work of breathing on room air, lungs clear to auscultation bilaterally MSK: normal bulk and tone.  Upper back midline tender to palpate around  C5 region.  Pain worse with neck flexion as well as retraction of scaphoids.  Area is not warm to touch nor erythematous.  No deformity present.  No step-off. Neurological: alert & oriented x 3, 5/5 strength in bilateral upper and lower extremities, normal gait Skin: warm and dry.   Psych: Normal mood and thought process   Assessment & Plan:   See Encounters Tab for problem based charting.  Patient discussed with Dr. , D.O. [facility] Internal Medicine, PGY-1 Pager: [phone], Phone: [phone] Date 10/01/2020 Time 6:02 PM

## 2020-10-01 NOTE — Assessment & Plan Note (Signed)
Assessment: Patient states that her blood pressures at home are well controlled.  She notes that being in the clinic raises her pressures.  Discussed with her the need to bring in her blood pressure readings at her next visit.  She agrees to bring in blood pressure cuff as well as readings during next visit.   Plan: -Continue to monitor -Assess home blood pressure readings

## 2020-10-01 NOTE — Assessment & Plan Note (Signed)
Assessment: Patient on Percocet 5-325 1-2 tabs daily for pain.  Patient has chronic pain consisting of myalgias and arthralgias secondary to possible missed connective tissue disease.  UDS on 04/08/2020.  PDMP reviewed with expected results.  We will refill patient's pain meds.   Plan: -Refill Percocet

## 2020-10-01 NOTE — Assessment & Plan Note (Signed)
Assessment: Patient seen in the ED for left-sided peritonsillar abscess.  The area was drained and she was prescribed antibiotics as well as steroids.  She completed both of these on time.  ENTs note recommend that she have follow-up with them and that because she has had this occur twice potential thyroidectomy  Patient has qualified for the orange card, uncertain if she qualified for [facility] letter as well.    Plan: -Order placed for ENT referral

## 2020-10-02 ENCOUNTER — Other Ambulatory Visit (HOSPITAL_COMMUNITY): Payer: Self-pay

## 2020-10-21 NOTE — Progress Notes (Signed)
Internal Medicine Clinic Attending  Case discussed with Dr. Katsadouros  At the time of the visit.  We reviewed the resident's history and exam and pertinent patient test results.  I agree with the assessment, diagnosis, and plan of care documented in the resident's note.  

## 2020-10-31 ENCOUNTER — Other Ambulatory Visit: Payer: Self-pay | Admitting: Student

## 2020-10-31 ENCOUNTER — Other Ambulatory Visit (HOSPITAL_COMMUNITY): Payer: Self-pay

## 2020-10-31 DIAGNOSIS — Z79891 Long term (current) use of opiate analgesic: Secondary | ICD-10-CM

## 2020-10-31 DIAGNOSIS — M351 Other overlap syndromes: Secondary | ICD-10-CM

## 2020-10-31 MED ORDER — OXYCODONE-ACETAMINOPHEN 5-325 MG PO TABS
ORAL_TABLET | ORAL | 0 refills | Status: DC
  Filled 2020-10-31: qty 45, 22d supply, fill #0

## 2020-10-31 NOTE — Telephone Encounter (Signed)
Need refill on oxyCODONE-acetaminophen (PERCOCET/ROXICET) 5-325 MG tablet ;pt contact [phone]   [facility]

## 2020-11-28 ENCOUNTER — Other Ambulatory Visit (HOSPITAL_COMMUNITY): Payer: Self-pay

## 2020-11-28 ENCOUNTER — Other Ambulatory Visit: Payer: Self-pay

## 2020-11-28 DIAGNOSIS — Z79891 Long term (current) use of opiate analgesic: Secondary | ICD-10-CM

## 2020-11-28 DIAGNOSIS — M351 Other overlap syndromes: Secondary | ICD-10-CM

## 2020-11-28 MED ORDER — OXYCODONE-ACETAMINOPHEN 5-325 MG PO TABS
1.0000 | ORAL_TABLET | Freq: Every day | ORAL | 0 refills | Status: DC | PRN
  Filled 2020-11-30: qty 45, 22d supply, fill #0

## 2020-11-28 NOTE — Telephone Encounter (Signed)
oxyCODONE-acetaminophen (PERCOCET/ROXICET) 5-325 MG tablet, refill request @  [facility] Phone:  [phone]  Fax:  [phone]

## 2020-11-29 ENCOUNTER — Other Ambulatory Visit (HOSPITAL_COMMUNITY): Payer: Self-pay

## 2020-11-30 ENCOUNTER — Other Ambulatory Visit (HOSPITAL_COMMUNITY): Payer: Self-pay

## 2020-12-30 ENCOUNTER — Other Ambulatory Visit: Payer: Self-pay

## 2020-12-30 ENCOUNTER — Other Ambulatory Visit (HOSPITAL_COMMUNITY): Payer: Self-pay

## 2020-12-30 DIAGNOSIS — M351 Other overlap syndromes: Secondary | ICD-10-CM

## 2020-12-30 DIAGNOSIS — Z79891 Long term (current) use of opiate analgesic: Secondary | ICD-10-CM

## 2020-12-30 MED ORDER — OXYCODONE-ACETAMINOPHEN 5-325 MG PO TABS
1.0000 | ORAL_TABLET | Freq: Every day | ORAL | 0 refills | Status: DC | PRN
  Filled 2020-12-30: qty 45, 22d supply, fill #0

## 2020-12-30 NOTE — Telephone Encounter (Signed)
oxyCODONE-acetaminophen (PERCOCET/ROXICET) 5-325 MG tablet, REFILL REQUEST @ [facility].

## 2021-01-30 ENCOUNTER — Other Ambulatory Visit: Payer: Self-pay

## 2021-01-30 DIAGNOSIS — Z79891 Long term (current) use of opiate analgesic: Secondary | ICD-10-CM

## 2021-01-30 DIAGNOSIS — M351 Other overlap syndromes: Secondary | ICD-10-CM

## 2021-01-30 MED ORDER — OXYCODONE-ACETAMINOPHEN 5-325 MG PO TABS
1.0000 | ORAL_TABLET | Freq: Every day | ORAL | 0 refills | Status: DC | PRN
  Filled 2021-01-30: qty 45, 22d supply, fill #0

## 2021-01-30 NOTE — Telephone Encounter (Signed)
Pt is requesting heroxyCODONE-acetaminophen (PERCOCET/ROXICET) 5-325 MG tablet sent to  [facility] Phone:  [phone]  Fax:  [phone]      

## 2021-01-31 ENCOUNTER — Other Ambulatory Visit (HOSPITAL_COMMUNITY): Payer: Self-pay

## 2021-03-03 ENCOUNTER — Other Ambulatory Visit (HOSPITAL_COMMUNITY): Payer: Self-pay

## 2021-03-03 ENCOUNTER — Other Ambulatory Visit: Payer: Self-pay

## 2021-03-03 DIAGNOSIS — Z79891 Long term (current) use of opiate analgesic: Secondary | ICD-10-CM

## 2021-03-03 DIAGNOSIS — M351 Other overlap syndromes: Secondary | ICD-10-CM

## 2021-03-03 MED ORDER — OXYCODONE-ACETAMINOPHEN 5-325 MG PO TABS
1.0000 | ORAL_TABLET | Freq: Every day | ORAL | 0 refills | Status: DC | PRN
  Filled 2021-03-03: qty 45, 22d supply, fill #0

## 2021-03-03 NOTE — Telephone Encounter (Signed)
Last rx written 01/30/21. Last OV 10/01/20. Next OV  has not been scheduled. UDS  04/08/20.

## 2021-03-03 NOTE — Telephone Encounter (Signed)
oxyCODONE-acetaminophen (PERCOCET/ROXICET) 5-325 MG tablet, REFILL REQUEST @  ? [facility]. ?

## 2021-04-02 ENCOUNTER — Other Ambulatory Visit (HOSPITAL_COMMUNITY): Payer: Self-pay

## 2021-04-02 ENCOUNTER — Other Ambulatory Visit: Payer: Self-pay

## 2021-04-02 DIAGNOSIS — M351 Other overlap syndromes: Secondary | ICD-10-CM

## 2021-04-02 DIAGNOSIS — Z79891 Long term (current) use of opiate analgesic: Secondary | ICD-10-CM

## 2021-04-02 MED ORDER — OXYCODONE-ACETAMINOPHEN 5-325 MG PO TABS
1.0000 | ORAL_TABLET | Freq: Every day | ORAL | 0 refills | Status: DC | PRN
  Filled 2021-04-02: qty 45, 22d supply, fill #0

## 2021-04-02 NOTE — Telephone Encounter (Signed)
oxyCODONE-acetaminophen (PERCOCET/ROXICET) 5-325 MG tablet, refill request @ [facility].

## 2021-04-02 NOTE — Telephone Encounter (Signed)
PDMP reviewed and appropriate for refill. Patient will need UDS at next office visit.

## 2021-05-02 ENCOUNTER — Other Ambulatory Visit: Payer: Self-pay

## 2021-05-02 ENCOUNTER — Other Ambulatory Visit (HOSPITAL_COMMUNITY): Payer: Self-pay

## 2021-05-02 DIAGNOSIS — Z79891 Long term (current) use of opiate analgesic: Secondary | ICD-10-CM

## 2021-05-02 DIAGNOSIS — M351 Other overlap syndromes: Secondary | ICD-10-CM

## 2021-05-02 MED ORDER — OXYCODONE-ACETAMINOPHEN 5-325 MG PO TABS
1.0000 | ORAL_TABLET | Freq: Every day | ORAL | 0 refills | Status: DC | PRN
  Filled 2021-05-02: qty 45, 22d supply, fill #0

## 2021-05-02 NOTE — Telephone Encounter (Signed)
PDMP appropriate, will refill. Can we please schedule the patient for a follow up appointment. It has been a year since her last UDS and she needs a repeat. Thank you

## 2021-05-02 NOTE — Telephone Encounter (Signed)
oxyCODONE-acetaminophen (PERCOCET/ROXICET) 5-325 MG tablet, refill request @ [facility].   

## 2021-06-03 ENCOUNTER — Other Ambulatory Visit: Payer: Self-pay

## 2021-06-03 ENCOUNTER — Ambulatory Visit (INDEPENDENT_AMBULATORY_CARE_PROVIDER_SITE_OTHER): Payer: Self-pay | Admitting: Internal Medicine

## 2021-06-03 ENCOUNTER — Other Ambulatory Visit (HOSPITAL_COMMUNITY): Payer: Self-pay

## 2021-06-03 VITALS — BP 148/79 | HR 62 | Temp 98.1°F | Ht 65.0 in | Wt 223.3 lb

## 2021-06-03 DIAGNOSIS — M351 Other overlap syndromes: Secondary | ICD-10-CM

## 2021-06-03 DIAGNOSIS — G8929 Other chronic pain: Secondary | ICD-10-CM

## 2021-06-03 DIAGNOSIS — Z0000 Encounter for general adult medical examination without abnormal findings: Secondary | ICD-10-CM

## 2021-06-03 DIAGNOSIS — Z79891 Long term (current) use of opiate analgesic: Secondary | ICD-10-CM

## 2021-06-03 MED ORDER — OXYCODONE-ACETAMINOPHEN 5-325 MG PO TABS
1.0000 | ORAL_TABLET | Freq: Every day | ORAL | 0 refills | Status: DC | PRN
  Filled 2021-06-03: qty 45, 22d supply, fill #0

## 2021-06-03 NOTE — Assessment & Plan Note (Signed)
Patient declines flu vaccination today. Provided information to breast center to schedule a free mammogram and pap smear.

## 2021-06-03 NOTE — Assessment & Plan Note (Signed)
Patient presents today for a refill of percocet 5-325 mg. She takes 1 tablet in the morning and half a tablet at night. She states this helps control her pain but she still continues to have pain that limits her quality of life. She is requesting to increase to one tablet BID. Her treatment goals are to be able to get her daily tasks done, specifically errands like shopping. She feels right now it takes her a very long time to get anything done due to the pain. She has trouble getting out of bed in the mornings due to pain. She continues to have joint and muscle pain. Unable to see a rheumatologist due to her lack of insurance. She is in the process of applying for disability.   -Refill percocet -Will talk to PCP about possibly increasing monthly amount -UDS today

## 2021-06-03 NOTE — Patient Instructions (Signed)
It was a pleasure meeting you today! I have sent in your refill. I will talk to Dr. Kirtland Bouchard about potentially increasing this.   Thank you for allowing [place] to be a part of your care!

## 2021-06-03 NOTE — Progress Notes (Signed)
° °  CC: chronic pain, medication refill  HPI:  [name] is a 61 y.o. with a PMHx listed below presenting for a refill of her percocet for her chronic pain. For details of today's visit and the status of his chronic medical issues please refer to the assessment and plan.   Past Medical History:  Diagnosis Date   Anxiety 10/09/2010   Arthritis    of cervical and lumbar spine   Asthma    undiagnosed, was told by a physician in the ED, patient does not take any inhalers at home.    Chest pain    multiple ED visit, no objective evidence of cardiac or pulmonary causes  of chest pain in past   Hypertension 04/06/2019   Restless leg syndrome 10/14/2017   Review of Systems:   Review of Systems  Constitutional:  Positive for malaise/fatigue. Negative for chills and fever.  Cardiovascular:  Positive for leg swelling.  Musculoskeletal:  Positive for joint pain, myalgias and neck pain.  Neurological:  Positive for weakness.    Physical Exam:  Vitals:   06/03/21 1446  BP: (!) 148/79  Pulse: 62  Temp: 98.1 F (36.7 C)  TempSrc: Oral  SpO2: 97%  Weight: 223 lb 4.8 oz (101.3 kg)  Height: 5\' 5"  (1.651 m)    Physical Exam General: alert, appears stated age, in no acute distress, tearful on exam HEENT: Normocephalic, atraumatic, EOM intact, conjunctiva normal CV: Regular rate and rhythm, no murmurs rubs or gallops Pulm: Clear to auscultation bilaterally, normal work of breathing Abdomen: Soft, nondistended, bowel sounds present, no tenderness to palpation MSK: No lower extremity edema Skin: Warm and dry Neuro: Alert and oriented x3   Assessment & Plan:   See Encounters Tab for problem based charting.  Patient discussed with Dr. 

## 2021-06-07 LAB — TOXASSURE SELECT,+ANTIDEPR,UR

## 2021-06-23 NOTE — Progress Notes (Signed)
Internal Medicine Clinic Attending ° °Case discussed with Dr. Rehman  At the time of the visit.  We reviewed the resident’s history and exam and pertinent patient test results.  I agree with the assessment, diagnosis, and plan of care documented in the resident’s note.  ° °

## 2021-07-03 ENCOUNTER — Other Ambulatory Visit (HOSPITAL_COMMUNITY): Payer: Self-pay

## 2021-07-03 ENCOUNTER — Other Ambulatory Visit: Payer: Self-pay

## 2021-07-03 DIAGNOSIS — Z79891 Long term (current) use of opiate analgesic: Secondary | ICD-10-CM

## 2021-07-03 DIAGNOSIS — M351 Other overlap syndromes: Secondary | ICD-10-CM

## 2021-07-03 MED ORDER — OXYCODONE-ACETAMINOPHEN 5-325 MG PO TABS
1.0000 | ORAL_TABLET | Freq: Every day | ORAL | 0 refills | Status: DC | PRN
  Filled 2021-07-03: qty 45, 22d supply, fill #0

## 2021-07-03 NOTE — Telephone Encounter (Signed)
oxyCODONE-acetaminophen (PERCOCET/ROXICET) 5-325 MG tablet, refill request @ [facility].   

## 2021-07-31 ENCOUNTER — Other Ambulatory Visit: Payer: Self-pay

## 2021-07-31 ENCOUNTER — Other Ambulatory Visit (HOSPITAL_COMMUNITY): Payer: Self-pay

## 2021-07-31 DIAGNOSIS — Z79891 Long term (current) use of opiate analgesic: Secondary | ICD-10-CM

## 2021-07-31 DIAGNOSIS — M351 Other overlap syndromes: Secondary | ICD-10-CM

## 2021-07-31 MED ORDER — OXYCODONE-ACETAMINOPHEN 5-325 MG PO TABS
1.0000 | ORAL_TABLET | Freq: Every day | ORAL | 0 refills | Status: DC | PRN
  Filled 2021-07-31: qty 45, 22d supply, fill #0

## 2021-07-31 NOTE — Telephone Encounter (Signed)
oxyCODONE-acetaminophen (PERCOCET/ROXICET) 5-325 MG tablet, REFILL REQUEST @  ? [place]. ?

## 2021-09-01 ENCOUNTER — Other Ambulatory Visit: Payer: Self-pay

## 2021-09-01 ENCOUNTER — Other Ambulatory Visit: Payer: Self-pay | Admitting: Student

## 2021-09-01 ENCOUNTER — Other Ambulatory Visit (HOSPITAL_COMMUNITY): Payer: Self-pay

## 2021-09-01 DIAGNOSIS — Z79891 Long term (current) use of opiate analgesic: Secondary | ICD-10-CM

## 2021-09-01 DIAGNOSIS — M351 Other overlap syndromes: Secondary | ICD-10-CM

## 2021-09-01 NOTE — Telephone Encounter (Signed)
oxyCODONE-acetaminophen (PERCOCET/ROXICET) 5-325 MG tablet, REFILL REQUEST @  ? [facility]. ?

## 2021-09-01 NOTE — Telephone Encounter (Signed)
Last ToxAssure 04/08/2021. Last Appt 06/03/2021.  No future appts scheduled. ?

## 2021-09-02 ENCOUNTER — Other Ambulatory Visit (HOSPITAL_COMMUNITY): Payer: Self-pay

## 2021-09-02 ENCOUNTER — Other Ambulatory Visit: Payer: Self-pay | Admitting: Student

## 2021-09-02 DIAGNOSIS — M351 Other overlap syndromes: Secondary | ICD-10-CM

## 2021-09-02 DIAGNOSIS — Z79891 Long term (current) use of opiate analgesic: Secondary | ICD-10-CM

## 2021-09-02 MED ORDER — OXYCODONE-ACETAMINOPHEN 5-325 MG PO TABS
1.0000 | ORAL_TABLET | Freq: Every day | ORAL | 0 refills | Status: DC | PRN
  Filled 2021-09-02 – 2021-09-03 (×2): qty 45, 22d supply, fill #0

## 2021-09-02 NOTE — Telephone Encounter (Signed)
Refilled

## 2021-09-02 NOTE — Telephone Encounter (Signed)
Last ToxAssure was 04/08/2020.  Last visit was 06/03/2021. ?

## 2021-09-03 ENCOUNTER — Other Ambulatory Visit (HOSPITAL_COMMUNITY): Payer: Self-pay

## 2021-10-02 ENCOUNTER — Other Ambulatory Visit: Payer: Self-pay

## 2021-10-02 DIAGNOSIS — M351 Other overlap syndromes: Secondary | ICD-10-CM

## 2021-10-02 DIAGNOSIS — Z79891 Long term (current) use of opiate analgesic: Secondary | ICD-10-CM

## 2021-10-03 ENCOUNTER — Other Ambulatory Visit (HOSPITAL_COMMUNITY): Payer: Self-pay

## 2021-10-04 ENCOUNTER — Other Ambulatory Visit (HOSPITAL_COMMUNITY): Payer: Self-pay

## 2021-10-04 MED ORDER — OXYCODONE-ACETAMINOPHEN 5-325 MG PO TABS
1.0000 | ORAL_TABLET | Freq: Every day | ORAL | 0 refills | Status: DC | PRN
  Filled 2021-10-04: qty 45, 22d supply, fill #0

## 2021-10-06 ENCOUNTER — Other Ambulatory Visit (HOSPITAL_COMMUNITY): Payer: Self-pay

## 2021-11-03 ENCOUNTER — Other Ambulatory Visit: Payer: Self-pay | Admitting: *Deleted

## 2021-11-03 ENCOUNTER — Telehealth: Payer: Self-pay | Admitting: Student

## 2021-11-03 DIAGNOSIS — Z79891 Long term (current) use of opiate analgesic: Secondary | ICD-10-CM

## 2021-11-03 DIAGNOSIS — M351 Other overlap syndromes: Secondary | ICD-10-CM

## 2021-11-03 MED ORDER — OXYCODONE-ACETAMINOPHEN 5-325 MG PO TABS
1.0000 | ORAL_TABLET | Freq: Every day | ORAL | 0 refills | Status: DC | PRN
  Filled 2021-11-03: qty 45, 30d supply, fill #0

## 2021-11-03 NOTE — Telephone Encounter (Signed)
Last appointment and ToxAssure was 06/03/2021. No future appointments scheduled.

## 2021-11-03 NOTE — Telephone Encounter (Signed)
Last ToxAssure and visit was 06/03/2021.  No scheduled future appointments.

## 2021-11-03 NOTE — Telephone Encounter (Signed)
Refill Request  oxyCODONE-acetaminophen (PERCOCET/ROXICET) 5-325 MG tablet  [facility] 

## 2021-11-04 ENCOUNTER — Other Ambulatory Visit (HOSPITAL_COMMUNITY): Payer: Self-pay

## 2021-11-16 ENCOUNTER — Encounter (HOSPITAL_COMMUNITY): Payer: Self-pay

## 2021-11-16 ENCOUNTER — Ambulatory Visit (HOSPITAL_COMMUNITY)
Admission: EM | Admit: 2021-11-16 | Discharge: 2021-11-16 | Disposition: A | Discharge status: Home / Self Care | Payer: Self-pay | Attending: Physician Assistant | Admitting: Physician Assistant

## 2021-11-16 ENCOUNTER — Ambulatory Visit (INDEPENDENT_AMBULATORY_CARE_PROVIDER_SITE_OTHER): Payer: Self-pay

## 2021-11-16 DIAGNOSIS — L03012 Cellulitis of left finger: Secondary | ICD-10-CM

## 2021-11-16 DIAGNOSIS — M79645 Pain in left finger(s): Secondary | ICD-10-CM

## 2021-11-16 MED ORDER — SULFAMETHOXAZOLE-TRIMETHOPRIM 800-160 MG PO TABS: 1.0000 | ORAL_TABLET | Freq: Two times a day (BID) | ORAL | 0 refills | Status: AC

## 2021-11-16 MED ORDER — CEPHALEXIN 500 MG PO CAPS: 500.0000 mg | ORAL_CAPSULE | Freq: Four times a day (QID) | ORAL | 0 refills | Status: DC

## 2021-11-16 MED ORDER — SULFAMETHOXAZOLE-TRIMETHOPRIM 800-160 MG PO TABS: 1.0000 | ORAL_TABLET | Freq: Two times a day (BID) | ORAL | 0 refills | Status: DC

## 2021-11-16 NOTE — ED Triage Notes (Signed)
Pt reports possible exposure to poison oak. States her L fingers have swelling and painful.

## 2021-11-16 NOTE — Discharge Instructions (Signed)
There was no evidence of infection in the bone.  We are starting 2 antibiotics to cover for infection.  Take Keflex 500 mg 4 times daily for 1 week.  Take Bactrim DS twice daily for 1 week.  Keep area clean with soap and water and apply antibiotic ointment as needed.  If your symptoms or not improving quickly you should follow-up with hand specialist.  Please call to schedule an appointment.  If anything worsens you develop fever, nausea, vomiting, increased welling, numbness, increased pain you need to go to the emergency room immediately.

## 2021-11-16 NOTE — ED Provider Notes (Signed)
[facility]    CSN: [id number] Arrival date & time: 11/16/21  1653      History   Chief Complaint Chief Complaint  Patient presents with   Poison Ivy    HPI Sonya Smith is a 61 y.o. female.   Patient presents today with a weeklong history of worsening left ring finger swelling and pain.  She reports that she had been working outside when she got into some briars and believes that she cut her finger.  She was also exposed to poison sumac/ivy and has a pruritic rash but this is not her primary concern.  She has a history of autoimmune condition (mixed connective tissue disease) and is concerned about infection.  She does not take any chronic steroids or immunosuppressive medications.  Denies any recent antibiotics.  Reports that her pain is significant but she has a difficult time determining if it is worsening as she takes chronic opioids due to chronic pain.  She has not been taking additional over-the-counter medication for symptom management.  She denies any fever, chest pain, shortness of breath, nausea/vomiting.    Past Medical History:  Diagnosis Date   Anxiety 10/09/2010   Arthritis    of cervical and lumbar spine   Asthma    undiagnosed, was told by a physician in the ED, patient does not take any inhalers at home.    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      Home Medications    Prior to Admission medications   Medication Sig Start Date End Date Taking? Authorizing Provider  aspirin EC 81 MG tablet Take 81 mg by mouth daily as needed for mild pain.    [provider]  cephALEXin (KEFLEX) 500 MG capsule Take 1 capsule (500 mg total) by mouth 4 (four) times daily. 11/16/21   Keyonda Bickle, Noberto Retort, PA-C  diclofenac sodium (VOLTAREN) 1 % GEL Apply 2 g topically 4 (four) times daily as needed (muscle pain).    [provider]  ibuprofen (ADVIL) 200 MG tablet Take 400 mg by mouth every 6 (six) hours as needed for moderate pain.    [provider]  oxyCODONE-acetaminophen (PERCOCET/ROXICET) 5-325 MG tablet Take 1 to 2 tablets by mouth daily as needed for severe pain. 11/03/21   Katsadouros, Vasilios, MD  sulfamethoxazole-trimethoprim (BACTRIM DS) 800-160 MG tablet Take 1 tablet by mouth 2 (two) times daily for 7 days. 11/16/21 11/23/21  Mazie Fencl, Noberto Retort, PA-C    Family History Family History  Problem Relation Age of Onset   Diabetes Mother    Stroke Maternal Aunt        54s   Cancer Maternal Uncle        recurrent  Hypertension Sister     Social History Social History   Tobacco Use   Smoking status: Former    Packs/day: 1.00    Years: 30.00    Total pack years: 30.00    Types: Cigarettes    Quit date: 10/09/2003    Years since quitting: 18.1   Smokeless tobacco: Never  Vaping Use   Vaping Use: Never used  Substance Use Topics   Alcohol use: No    Alcohol/week: 0.0 standard drinks of alcohol   Drug use: Yes    Comment: occassional marijuana     Allergies   Meloxicam, Codeine, and Darvocet [propoxyphene n-acetaminophen]   Review of Systems Review of Systems  Constitutional:  Positive for activity change. Negative for appetite change, fatigue and fever.  Respiratory:  Negative for cough and shortness of breath.    Cardiovascular:  Negative for chest pain.  Gastrointestinal:  Negative for abdominal pain, diarrhea, nausea and vomiting.  Musculoskeletal:  Positive for arthralgias. Negative for myalgias.  Skin:  Positive for color change and wound.  Neurological:  Negative for dizziness, weakness, light-headedness, numbness and headaches.     Physical Exam Triage Vital Signs ED Triage Vitals  Enc Vitals Group     BP 11/16/21 1737 (!) 174/88     Pulse Rate 11/16/21 1736 60     Resp 11/16/21 1736 18     Temp 11/16/21 1737 98.2 F (36.8 C)     Temp Source 11/16/21 1737 Oral     SpO2 11/16/21 1736 100 %     Weight --      Height --      Head Circumference --      Peak Flow --      Pain Score 11/16/21 1734 7     Pain Loc --      Pain Edu? --      Excl. in GC? --    No data found.  Updated Vital Signs BP (!) 159/83 (BP Location: Right Arm)   Pulse 60   Temp 98.2 F (36.8 C) (Oral)   Resp 18   LMP 07/07/2015   SpO2 100%   Visual Acuity Right Eye Distance:   Left Eye Distance:   Bilateral Distance:    Right Eye Near:   Left Eye Near:    Bilateral Near:     Physical Exam Vitals reviewed.  Constitutional:      General: She is awake. She is not in acute distress.    Appearance: Normal appearance. She is well-developed. She is not ill-appearing.     Comments: Very pleasant female appears stated age in no acute distress sitting comfortably in exam room  HENT:     Head: Normocephalic and atraumatic.     Mouth/Throat:     Pharynx: Uvula midline. No oropharyngeal exudate or posterior oropharyngeal erythema.  Cardiovascular:     Rate and Rhythm: Normal rate and regular rhythm.     Heart sounds: Normal heart sounds, S1 normal and S2 normal. No murmur heard. Pulmonary:     Effort: Pulmonary effort is normal.     Breath sounds: Normal breath sounds. No wheezing, rhonchi or rales.     Comments: Clear to auscultation bilaterally Musculoskeletal:     Left hand: Swelling, tenderness and  bony tenderness present. Decreased range of motion. There is no disruption of two-point discrimination. Normal capillary refill.     Comments: Left ring finger; swollen with decreased range of motion with flexion secondary to swelling.  Tenderness palpation over  middle phalanx without deformity.  Hand neurovascularly intact.  Skin:    Comments: Widespread maculopapular rash with evidence of excoriation.  Psychiatric:        Behavior: Behavior is cooperative.      UC Treatments / Results  Labs (all labs ordered are listed, but only abnormal results are displayed) Labs Reviewed - No data to display  EKG   Radiology DG Finger Ring Left  Result Date: 11/16/2021 CLINICAL DATA:  LEFT ring finger pain and swelling. Initial encounter. EXAM: LEFT RING FINGER 3 V COMPARISON:  None Available. FINDINGS: Diffuse soft tissue swelling is noted. There is no evidence of fracture, subluxation, dislocation or focal bony lesion. No radiopaque foreign bodies are noted. IMPRESSION: Soft tissue swelling without acute bony abnormality. Electronically Signed   By: Harmon Pier M.D.   On: 11/16/2021 18:17    Procedures Procedures (including critical care time)  Medications Ordered in UC Medications - No data to display  Initial Impression / Assessment and Plan / UC Course  I have reviewed the triage vital signs and the nursing notes.  Pertinent labs & imaging results that were available during my care of the patient were reviewed by me and considered in my medical decision making (see chart for details).     Concern for infection given degree of swelling and erythema on clinical exam.  X-ray was obtained that showed no evidence of osteomyelitis.  We will start double coverage with cephalexin and Bactrim DS.  Discussed that she should use warm soaks as well as over-the-counter medications for symptom relief.  If her symptoms are not improving she should follow-up with hand specialist and was given contact  information for local provider with instruction to call to schedule an appointment.  Discussed that if she has any worsening symptoms including increased pain, decreased range of motion, fever, nausea, vomiting she needs to go to the emergency room as she may need IV antibiotics to which she expressed understanding.  Strict return precautions given.  Final Clinical Impressions(s) / UC Diagnoses   Final diagnoses:  Cellulitis of finger of left hand     Discharge Instructions      There was no evidence of infection in the bone.  We are starting 2 antibiotics to cover for infection.  Take Keflex 500 mg 4 times daily for 1 week.  Take Bactrim DS twice daily for 1 week.  Keep area clean with soap and water and apply antibiotic ointment as needed.  If your symptoms or not improving quickly you should follow-up with hand specialist.  Please call to schedule an appointment.  If anything worsens you develop fever, nausea, vomiting, increased welling, numbness, increased pain you need to go to the emergency room immediately.     ED Prescriptions     Medication Sig Dispense Auth. Provider   cephALEXin (KEFLEX) 500 MG capsule  (Status: Discontinued) Take 1 capsule (500 mg total) by mouth 4 (four) times daily. 28 capsule Danashia Landers K, PA-C   sulfamethoxazole-trimethoprim (BACTRIM DS) 800-160 MG tablet  (Status: Discontinued) Take 1 tablet by mouth 2 (two) times daily for 7 days. 14 tablet Vernecia Umble K, PA-C   cephALEXin (KEFLEX) 500 MG capsule Take 1 capsule (500 mg total) by mouth 4 (four) times daily. 28 capsule Maccoy Haubner K, PA-C   sulfamethoxazole-trimethoprim (BACTRIM DS) 800-160 MG tablet Take 1 tablet by mouth 2 (two) times daily for 7 days. 14 tablet Jodey Burbano, Noberto Retort, PA-C      PDMP not reviewed this  encounter.   Jeani Hawking, PA-C 11/16/21 1843

## 2021-12-01 ENCOUNTER — Other Ambulatory Visit (HOSPITAL_COMMUNITY): Payer: Self-pay

## 2021-12-01 ENCOUNTER — Other Ambulatory Visit: Payer: Self-pay

## 2021-12-01 DIAGNOSIS — Z79891 Long term (current) use of opiate analgesic: Secondary | ICD-10-CM

## 2021-12-01 DIAGNOSIS — M351 Other overlap syndromes: Secondary | ICD-10-CM

## 2021-12-01 MED ORDER — OXYCODONE-ACETAMINOPHEN 5-325 MG PO TABS
1.0000 | ORAL_TABLET | Freq: Every day | ORAL | 0 refills | Status: DC | PRN
  Filled 2021-12-01 – 2021-12-05 (×2): qty 45, 22d supply, fill #0

## 2021-12-05 ENCOUNTER — Other Ambulatory Visit (HOSPITAL_COMMUNITY): Payer: Self-pay

## 2021-12-15 ENCOUNTER — Other Ambulatory Visit: Payer: Self-pay

## 2021-12-15 ENCOUNTER — Encounter: Payer: Self-pay | Admitting: Student

## 2021-12-15 ENCOUNTER — Ambulatory Visit (INDEPENDENT_AMBULATORY_CARE_PROVIDER_SITE_OTHER): Payer: Self-pay | Admitting: Student

## 2021-12-15 DIAGNOSIS — M351 Other overlap syndromes: Secondary | ICD-10-CM

## 2021-12-15 DIAGNOSIS — I10 Essential (primary) hypertension: Secondary | ICD-10-CM

## 2021-12-15 DIAGNOSIS — Z0000 Encounter for general adult medical examination without abnormal findings: Secondary | ICD-10-CM

## 2021-12-15 DIAGNOSIS — Z87891 Personal history of nicotine dependence: Secondary | ICD-10-CM

## 2021-12-15 NOTE — Assessment & Plan Note (Signed)
Given referral for free mammogram screening

## 2021-12-15 NOTE — Patient Instructions (Signed)
Thank you, Ms.[name] for allowing [place] to provide your care today. Today we discussed .  Chronic Pain Please let us know if you approve for disability. If not, we will work on getting you the orange card again and follow up with rheumatology.     I have ordered the following labs for you:  Lab Orders  No laboratory test(s) ordered today    Referrals ordered today:   Referral Orders  No referral(s) requested today     I have ordered the following medication/changed the following medications:   Stop the following medications: There are no discontinued medications.   Start the following medications: No orders of the defined types were placed in this encounter.    Follow up: 3-4 months    Should you have any questions or concerns please call the internal medicine clinic at [phone].    Thalia Bloodgood, D.O. [facility]

## 2021-12-15 NOTE — Assessment & Plan Note (Addendum)
Patient last seen 5/22 for her mixed connective tissue disorder diagnosed in the past. She has not followed up with rheumatology despite multiple referrals while insured. She is now uninsured and applying for disability. She is quite tearful on exam, endorsing chronic pain and not having an answer as to the etiology. We discussed the need to follow up once referral are placed. She is currently on percocet's for her chronic pain however she continues to be non adherent to referrals and further recommendations will need to consider tapering off. We also discussed how starting other medications without knowing what it is we are treating is inappropriate.   She does not want to have myositis work up done as she is uninsured. Continued to encourage her to re-apply for orange card/kafa letter if disability not approved.

## 2021-12-15 NOTE — Progress Notes (Signed)
CC: follow up chronic pain  HPI:  Sonya Smith is a 61 y.o. female living with a history stated below and presents today for her chronic pain thought to be 2/2 mixed connective tissue disease. Please see problem based assessment and plan for additional details.  Past Medical History:  Diagnosis Date   Anxiety 10/09/2010   Arthritis    of cervical and lumbar spine   Asthma    undiagnosed, was told by a physician in the ED, patient does not take any inhalers at home.    Chest pain    multiple ED visit, no objective evidence of cardiac or pulmonary causes  of chest pain in past   Hypertension 04/06/2019   Restless leg syndrome 10/14/2017    Current Outpatient Medications on File Prior to Visit  Medication Sig Dispense Refill   aspirin EC 81 MG tablet Take 81 mg by mouth daily as needed for mild pain.     cephALEXin (KEFLEX) 500 MG capsule Take 1 capsule (500 mg total) by mouth 4 (four) times daily. 28 capsule 0   diclofenac sodium (VOLTAREN) 1 % GEL Apply 2 g topically 4 (four) times daily as needed (muscle pain).     ibuprofen (ADVIL) 200 MG tablet Take 400 mg by mouth every 6 (six) hours as needed for moderate pain.     oxyCODONE-acetaminophen (PERCOCET/ROXICET) 5-325 MG tablet Take 1 to 2 tablets by mouth daily as needed for severe pain. 45 tablet 0   No current facility-administered medications on file prior to visit.    Family History  Problem Relation Age of Onset   Diabetes Mother    Stroke Maternal Aunt        61s   Cancer Maternal Uncle        recurrent   Hypertension Sister     Social History   Socioeconomic History   Marital status: Divorced    Spouse name: Not on file   Number of children: Not on file   Years of education: Not on file   Highest education level: Not on file  Occupational History   Not on file  Tobacco Use   Smoking status: Former    Packs/day: 1.00    Years: 30.00    Total pack years: 30.00    Types: Cigarettes    Quit date:  10/09/2003    Years since quitting: 18.1   Smokeless tobacco: Never  Vaping Use   Vaping Use: Never used  Substance and Sexual Activity   Alcohol use: No    Alcohol/week: 0.0 standard drinks of alcohol   Drug use: Yes    Comment: occassional marijuana   Sexual activity: Not Currently  Other Topics Concern   Not on file  Social History Narrative   Lives in [place], [place] with her boy friend who is disabled. 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 She has a daughter who is independent and provides her money for medicines. 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  No health insurance.    Social Determinants of Health   Financial Resource Strain: Not on file  Food Insecurity: Not on file  Transportation Needs: Not on file  Physical Activity: Not on file  Stress: Not on file  Social Connections: Not on file  Intimate Partner Violence: Not on file    Review of Systems: ROS negative except for what is noted on the assessment and plan.  Vitals:   12/15/21 1035  BP: (!) 145/76  Pulse: (!) 51  Temp: 97.9 F (36.6 C)  TempSrc: Oral  SpO2: 97%  Weight: 220 lb 11.2 oz (100.1 kg)  Height: 5\' 2"  (1.575 m)    Physical Exam: Constitutional: no acute distress HENT: normocephalic atraumatic Eyes: conjunctiva non-erythematous Neck: supple Cardiovascular: regular rate and rhythm, no m/r/g Pulmonary/Chest: normal work of breathing on room air MSK: normal bulk and tone Neurological: alert & oriented x 3 Skin: warm and dry Psych: tearful  Assessment & Plan:   Hypertension Continues to be elevated, will discuss initiating medication management at follow up.   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 She has not followed up with rheumatology despite multiple referrals while insured. She is now uninsured and applying for disability. She  is quite tearful on exam, endorsing chronic pain and not having an answer as to the etiology. We discussed the need to follow up once referral are placed. She is currently on percocet's for her chronic pain however she continues to be non adherent to referrals and further recommendations will need to consider tapering off. We also discussed how starting other medications without knowing what it is we are treating is inappropriate.   She does not want to have myositis work up done as she is uninsured. Continued to encourage her to re-apply for orange card/kafa letter if disability not approved.   Patient discussed with Dr. 6/22, D.O. [facility], PGY-3 Phone: [phone] Date 12/15/2021 Time 6:56 PM

## 2021-12-15 NOTE — Assessment & Plan Note (Signed)
Continues to be elevated, will discuss initiating medication management at follow up.

## 2021-12-17 NOTE — Progress Notes (Signed)
Internal Medicine Clinic Attending  Case discussed with the resident at the time of the visit.  We reviewed the resident's history and exam and pertinent patient test results.  I agree with the assessment, diagnosis, and plan of care documented in the resident's note.  

## 2022-01-05 ENCOUNTER — Other Ambulatory Visit: Payer: Self-pay | Admitting: Student

## 2022-01-05 ENCOUNTER — Other Ambulatory Visit (HOSPITAL_COMMUNITY): Payer: Self-pay

## 2022-01-05 DIAGNOSIS — Z79891 Long term (current) use of opiate analgesic: Secondary | ICD-10-CM

## 2022-01-05 DIAGNOSIS — M351 Other overlap syndromes: Secondary | ICD-10-CM

## 2022-01-05 MED ORDER — OXYCODONE-ACETAMINOPHEN 5-325 MG PO TABS
1.0000 | ORAL_TABLET | Freq: Every day | ORAL | 0 refills | Status: DC | PRN
  Filled 2022-01-05: qty 45, 30d supply, fill #0

## 2022-01-05 NOTE — Telephone Encounter (Signed)
Refill Request  oxyCODONE-acetaminophen (PERCOCET/ROXICET) 5-325 MG tablet  [facility]

## 2022-01-05 NOTE — Telephone Encounter (Signed)
PDMP reviewed and appropriate. Will refill.  °

## 2022-02-03 ENCOUNTER — Other Ambulatory Visit (HOSPITAL_COMMUNITY): Payer: Self-pay

## 2022-02-03 ENCOUNTER — Other Ambulatory Visit: Payer: Self-pay

## 2022-02-03 DIAGNOSIS — M351 Other overlap syndromes: Secondary | ICD-10-CM

## 2022-02-03 DIAGNOSIS — Z79891 Long term (current) use of opiate analgesic: Secondary | ICD-10-CM

## 2022-02-03 MED ORDER — OXYCODONE-ACETAMINOPHEN 5-325 MG PO TABS
1.0000 | ORAL_TABLET | Freq: Every day | ORAL | 0 refills | Status: DC | PRN
  Filled 2022-02-03: qty 45, 22d supply, fill #0

## 2022-03-04 ENCOUNTER — Other Ambulatory Visit: Payer: Self-pay

## 2022-03-04 DIAGNOSIS — M351 Other overlap syndromes: Secondary | ICD-10-CM

## 2022-03-04 DIAGNOSIS — Z79891 Long term (current) use of opiate analgesic: Secondary | ICD-10-CM

## 2022-03-04 NOTE — Telephone Encounter (Signed)
Last rx written 02/03/22. Last OV 12/15/21. Next OV- has not been scheduled. Tox 04/08/20.

## 2022-03-04 NOTE — Telephone Encounter (Signed)
oxyCODONE-acetaminophen (PERCOCET/ROXICET) 5-325 MG tablet  refill request @ [place].

## 2022-03-05 ENCOUNTER — Other Ambulatory Visit: Payer: Self-pay | Admitting: Student

## 2022-03-05 ENCOUNTER — Other Ambulatory Visit (HOSPITAL_COMMUNITY): Payer: Self-pay

## 2022-03-05 DIAGNOSIS — M351 Other overlap syndromes: Secondary | ICD-10-CM

## 2022-03-05 DIAGNOSIS — Z79891 Long term (current) use of opiate analgesic: Secondary | ICD-10-CM

## 2022-03-05 NOTE — Telephone Encounter (Signed)
Last rx written 02/03/22. Last OV 12/15/21. Next OV - has not been scheduled. Tox 06/03/21

## 2022-03-06 ENCOUNTER — Other Ambulatory Visit (HOSPITAL_COMMUNITY): Payer: Self-pay

## 2022-03-06 MED ORDER — OXYCODONE-ACETAMINOPHEN 5-325 MG PO TABS
1.0000 | ORAL_TABLET | Freq: Every day | ORAL | 0 refills | Status: DC | PRN
  Filled 2022-03-06: qty 45, 22d supply, fill #0

## 2022-03-27 ENCOUNTER — Other Ambulatory Visit (HOSPITAL_COMMUNITY): Payer: Self-pay

## 2022-04-03 ENCOUNTER — Other Ambulatory Visit: Payer: Self-pay | Admitting: Student

## 2022-04-03 DIAGNOSIS — Z79891 Long term (current) use of opiate analgesic: Secondary | ICD-10-CM

## 2022-04-03 DIAGNOSIS — M351 Other overlap syndromes: Secondary | ICD-10-CM

## 2022-04-03 MED ORDER — OXYCODONE-ACETAMINOPHEN 5-325 MG PO TABS
1.0000 | ORAL_TABLET | Freq: Every day | ORAL | 0 refills | Status: DC | PRN
  Filled 2022-04-03: qty 45, 22d supply, fill #0
  Filled 2022-04-06: qty 45, 30d supply, fill #0

## 2022-04-03 NOTE — Telephone Encounter (Signed)
Last rx written 03/06/22. Last OV 12/15/21. Next OV 05/18/22 with PCP. [place] 06/03/21.

## 2022-04-03 NOTE — Telephone Encounter (Signed)
MED REFILL REQUEST  oxyCODONE-acetaminophen (PERCOCET/ROXICET) 5-325 MG tablet   [place] Phone: [phone]  Fax: [phone]

## 2022-04-03 NOTE — Telephone Encounter (Signed)
PDMP reviewed and appropriate

## 2022-04-04 ENCOUNTER — Other Ambulatory Visit (HOSPITAL_COMMUNITY): Payer: Self-pay

## 2022-04-06 ENCOUNTER — Other Ambulatory Visit (HOSPITAL_COMMUNITY): Payer: Self-pay

## 2022-05-05 ENCOUNTER — Other Ambulatory Visit: Payer: Self-pay

## 2022-05-05 DIAGNOSIS — M351 Other overlap syndromes: Secondary | ICD-10-CM

## 2022-05-05 DIAGNOSIS — Z79891 Long term (current) use of opiate analgesic: Secondary | ICD-10-CM

## 2022-05-05 NOTE — Telephone Encounter (Signed)
oxyCODONE-acetaminophen (PERCOCET/ROXICET) 5-325 MG tablet, REFILL REQUEST @  [facility] [facility]

## 2022-05-06 ENCOUNTER — Other Ambulatory Visit (HOSPITAL_COMMUNITY): Payer: Self-pay

## 2022-05-06 MED ORDER — OXYCODONE-ACETAMINOPHEN 5-325 MG PO TABS
1.0000 | ORAL_TABLET | Freq: Every day | ORAL | 0 refills | Status: DC | PRN
  Filled 2022-05-06: qty 45, 22d supply, fill #0

## 2022-05-18 ENCOUNTER — Other Ambulatory Visit (HOSPITAL_COMMUNITY): Payer: Self-pay

## 2022-05-18 ENCOUNTER — Encounter: Payer: Self-pay | Admitting: Internal Medicine

## 2022-05-18 ENCOUNTER — Ambulatory Visit (INDEPENDENT_AMBULATORY_CARE_PROVIDER_SITE_OTHER): Payer: Self-pay | Admitting: Internal Medicine

## 2022-05-18 VITALS — BP 167/77 | HR 83 | Temp 97.8°F | Ht 62.0 in | Wt 230.9 lb

## 2022-05-18 DIAGNOSIS — Z79891 Long term (current) use of opiate analgesic: Secondary | ICD-10-CM

## 2022-05-18 DIAGNOSIS — Z87891 Personal history of nicotine dependence: Secondary | ICD-10-CM

## 2022-05-18 DIAGNOSIS — I10 Essential (primary) hypertension: Secondary | ICD-10-CM

## 2022-05-18 DIAGNOSIS — M351 Other overlap syndromes: Secondary | ICD-10-CM

## 2022-05-18 MED ORDER — OXYCODONE-ACETAMINOPHEN 5-325 MG PO TABS: 2.0000 | ORAL_TABLET | Freq: Two times a day (BID) | ORAL | 0 refills | Status: DC | PRN

## 2022-05-18 MED ORDER — AMLODIPINE BESYLATE 5 MG PO TABS
5.0000 mg | ORAL_TABLET | Freq: Every day | ORAL | 11 refills | Status: DC
  Filled 2022-05-18: qty 30, 30d supply, fill #0
  Filled 2022-06-19: qty 30, 30d supply, fill #1
  Filled 2022-07-20: qty 30, 30d supply, fill #2
  Filled 2022-08-17: qty 30, 30d supply, fill #3
  Filled 2022-09-17: qty 30, 30d supply, fill #4
  Filled 2022-10-13: qty 30, 30d supply, fill #5
  Filled 2022-11-16: qty 30, 30d supply, fill #6
  Filled 2022-12-18: qty 30, 30d supply, fill #7
  Filled 2023-01-21: qty 30, 30d supply, fill #8
  Filled 2023-02-22: qty 30, 30d supply, fill #9
  Filled 2023-03-22: qty 30, 30d supply, fill #10
  Filled 2023-04-23: qty 30, 30d supply, fill #11

## 2022-05-18 NOTE — Progress Notes (Signed)
CC: follow up chronic pain   HPI:  [name] is a 61 y.o. female living with a history stated below and presents today for a follow up of chronic pain and elevated blood pressure. Please see problem based assessment and plan for additional details.  Past Medical History:  Diagnosis Date   Anxiety 10/09/2010   Arthritis    of cervical and lumbar spine   Asthma    undiagnosed, was told by a physician in the ED, patient does not take any inhalers at home.    Chest pain    multiple ED visit, no objective evidence of cardiac or pulmonary causes  of chest pain in past   Hypertension 04/06/2019   Restless leg syndrome 10/14/2017    Current Outpatient Medications on File Prior to Visit  Medication Sig Dispense Refill   aspirin EC 81 MG tablet Take 81 mg by mouth daily as needed for mild pain.     cephALEXin (KEFLEX) 500 MG capsule Take 1 capsule (500 mg total) by mouth 4 (four) times daily. 28 capsule 0   diclofenac sodium (VOLTAREN) 1 % GEL Apply 2 g topically 4 (four) times daily as needed (muscle pain).     ibuprofen (ADVIL) 200 MG tablet Take 400 mg by mouth every 6 (six) hours as needed for moderate pain.     No current facility-administered medications on file prior to visit.    Family History  Problem Relation Age of Onset   Diabetes Mother    Stroke Maternal Aunt        74s   Cancer Maternal Uncle        recurrent   Hypertension Sister     Social History   Socioeconomic History   Marital status: Divorced    Spouse name: Not on file   Number of children: Not on file   Years of education: Not on file   Highest education level: Not on file  Occupational History   Not on file  Tobacco Use   Smoking status: Former    Packs/day: 1.00    Years: 30.00    Total pack years: 30.00    Types: Cigarettes    Quit date: 10/09/2003    Years since quitting: 18.6   Smokeless tobacco: Never  Vaping Use   Vaping Use: Never used  Substance and Sexual Activity   Alcohol  use: No    Alcohol/week: 0.0 standard drinks of alcohol   Drug use: Yes    Comment: occassional marijuana   Sexual activity: Not Currently  Other Topics Concern   Not on file  Social History Narrative   Lives in [place], [place] with her boy friend who is disabled. 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 She has a daughter who is independent and provides her money for medicines. 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  No health insurance.    Social Determinants of Health   Financial Resource Strain: Not on file  Food Insecurity: Not on file  Transportation Needs: Not on file  Physical Activity: Not on file  Stress: Not on file  Social Connections: Not on file  Intimate Partner Violence: Not on file    Review of Systems: ROS negative except for what is noted on the assessment and plan.  Vitals:   05/18/22 0942  BP: (!) 167/77  Pulse: 83  Temp: 97.8 F (36.6 C)  TempSrc: Oral  SpO2:  97%  Weight: 230 lb 14.4 oz (104.7 kg)  Height: 5\' 2"  (1.575 m)    Physical Exam: Constitutional: well-appearing, tearful female sitting in chair, in no acute distress Cardiovascular: regular rate and rhythm, no m/r/g Pulmonary/Chest: normal work of breathing on room air MSK: normal bulk and tone Neurological: alert & oriented x 3, no focal deficit Skin: warm and dry Psych: tearful  Assessment & Plan:    Patient discussed with Dr.  Hypertension Blood pressure continues to be elevated, was initially 166/77 and then 153/76 upon recheck.  The patient's blood pressure has been elevated over her last few clinic visits, and there was previous discussion about starting the patient on an antihypertensive.  Plan: -Start amlodipine 5 mg daily  Mixed connective tissue disease (HCC) The patient was last seen on 7/17 for her mixed connective tissue disorder and chronic pain (neck, hands, feet, knees, back).  She has not  followed up with rheumatology, as she is uninsured and we have not been able to place a referral yet.  Previously when she did have insurance, she had missed appointments to rheumatology despite multiple referrals.  Today, the patient is quite tearful on exam, endorsing chronic pain that is not alleviated with her current dose of Percocet.  The patient is in the process of filling out paperwork for disability, and will have Medicaid soon.  We discussed increasing her Percocet dose, however, it will be very important for her to follow-up with rheumatology when she gets Medicaid.  We discussed that if she does not follow-up with them, like she did before, we will not be able to continue increasing her opioid dose.  Plan: -Increase Percocet to 5-325 mg twice a day (was taking 1 to 1-1/2 tablets/day previously) -Patient will call when she has Medicaid so we can place a referral to rheumatology   Liridona Mashaw, D.O. [facility], PGY-2 Phone: [phone] Date 05/18/2022 Time 12:01 PM

## 2022-05-18 NOTE — Assessment & Plan Note (Signed)
Blood pressure continues to be elevated, was initially 166/77 and then 153/76 upon recheck.  The patient's blood pressure has been elevated over her last few clinic visits, and there was previous discussion about starting the patient on an antihypertensive.  Plan: -Start amlodipine 5 mg daily

## 2022-05-18 NOTE — Assessment & Plan Note (Addendum)
The patient was last seen on 7/17 for her mixed connective tissue disorder and chronic pain (neck, hands, feet, knees, back).  She has not followed up with rheumatology, as she is uninsured and we have not been able to place a referral yet.  Previously when she did have insurance, she had missed appointments to rheumatology despite multiple referrals.  Today, the patient is quite tearful on exam, endorsing chronic pain that is not alleviated with her current dose of Percocet.  The patient is in the process of filling out paperwork for disability, and will have Medicaid soon.  We discussed increasing her Percocet dose, however, it will be very important for her to follow-up with rheumatology when she gets Medicaid.  We discussed that if she does not follow-up with them, like she did before, we will not be able to continue increasing her opioid dose.  Plan: -Increase Percocet to 5-325 mg twice a day (was taking 1 to 1-1/2 tablets/day previously) -Patient will call when she has Medicaid so we can place a referral to rheumatology

## 2022-05-18 NOTE — Patient Instructions (Addendum)
Thank you, Sonya Smith for allowing [place] to provide your care today. Today we discussed: High blood pressure: Start taking amlodipine 5 mg once a day  Chronic pain: You can take Percocet 1 full tablet twice a day, which is a higher dose than you are previously taking, and should help your pain.  Once you have insurance we will get you in with the rheumatologist  I have ordered the following labs for you:  Lab Orders  No laboratory test(s) ordered today      Referrals ordered today:   Referral Orders  No referral(s) requested today     I have ordered the following medication/changed the following medications:   Stop the following medications: There are no discontinued medications.   Start the following medications: No orders of the defined types were placed in this encounter.    Follow up: 3 months     Should you have any questions or concerns please call the internal medicine clinic at [phone].     Elza Rafter, D.O. [facility]

## 2022-05-20 NOTE — Progress Notes (Signed)
Internal Medicine Clinic Attending  Case discussed with the resident at the time of the visit.  We reviewed the resident's history and exam and pertinent patient test results.  I agree with the assessment, diagnosis, and plan of care documented in the resident's note.  

## 2022-05-29 ENCOUNTER — Telehealth (INDEPENDENT_AMBULATORY_CARE_PROVIDER_SITE_OTHER): Payer: Self-pay | Admitting: Internal Medicine

## 2022-05-29 ENCOUNTER — Other Ambulatory Visit (HOSPITAL_COMMUNITY): Payer: Self-pay

## 2022-05-29 DIAGNOSIS — M351 Other overlap syndromes: Secondary | ICD-10-CM

## 2022-05-29 DIAGNOSIS — Z79891 Long term (current) use of opiate analgesic: Secondary | ICD-10-CM

## 2022-05-29 MED ORDER — OXYCODONE-ACETAMINOPHEN 5-325 MG PO TABS
2.0000 | ORAL_TABLET | Freq: Two times a day (BID) | ORAL | 0 refills | Status: DC | PRN
  Filled 2022-05-29: qty 60, 15d supply, fill #0

## 2022-05-29 NOTE — Telephone Encounter (Signed)
Patient contacted [facility] after hours line.  [name] is a 61 year old with past medical history of mixed connective tissue disease who is on chronic opioid therapy.  Previously she was getting Percocet 5 mg 1.5 tabs daily.  At her follow-up visit on December 18 her dose was increased to 5 mg twice daily.  History was on December 6.  With increased dosing she has ran out of medications.   Last pain contract was done January 2018.  Last UDS appropriate completed January 2023.  Plan: Percocet 5-325 mg, #60 tablets to [facility] outpatient pharmacy.  At office visit December 18, patient was encouraged to follow-up with rheumatology.  Urged patient to follow-up within internal medicine clinic as well as she needs to repeat UDS and update pain contract.  Sent a message to the front office to help with coordinating this.  If she continues to have difficulty with obtaining insurance may consider referral to social work for assistance.

## 2022-06-19 ENCOUNTER — Other Ambulatory Visit (HOSPITAL_COMMUNITY): Payer: Self-pay

## 2022-06-29 ENCOUNTER — Other Ambulatory Visit (HOSPITAL_COMMUNITY): Payer: Self-pay

## 2022-06-29 ENCOUNTER — Other Ambulatory Visit: Payer: Self-pay

## 2022-06-29 DIAGNOSIS — M351 Other overlap syndromes: Secondary | ICD-10-CM

## 2022-06-29 DIAGNOSIS — Z79891 Long term (current) use of opiate analgesic: Secondary | ICD-10-CM

## 2022-06-29 MED ORDER — OXYCODONE-ACETAMINOPHEN 5-325 MG PO TABS
2.0000 | ORAL_TABLET | Freq: Two times a day (BID) | ORAL | 0 refills | Status: DC | PRN
  Filled 2022-06-29: qty 60, 15d supply, fill #0

## 2022-07-20 ENCOUNTER — Other Ambulatory Visit (HOSPITAL_COMMUNITY): Payer: Self-pay

## 2022-07-29 ENCOUNTER — Other Ambulatory Visit (HOSPITAL_COMMUNITY): Payer: Self-pay

## 2022-07-29 ENCOUNTER — Other Ambulatory Visit: Payer: Self-pay

## 2022-07-29 DIAGNOSIS — Z79891 Long term (current) use of opiate analgesic: Secondary | ICD-10-CM

## 2022-07-29 DIAGNOSIS — M351 Other overlap syndromes: Secondary | ICD-10-CM

## 2022-07-29 MED ORDER — OXYCODONE-ACETAMINOPHEN 5-325 MG PO TABS
1.0000 | ORAL_TABLET | Freq: Two times a day (BID) | ORAL | 0 refills | Status: DC | PRN
  Filled 2022-07-29: qty 60, 30d supply, fill #0

## 2022-07-30 ENCOUNTER — Telehealth: Payer: Self-pay | Admitting: *Deleted

## 2022-07-30 NOTE — Telephone Encounter (Signed)
Call from patient in a lot of pain from a toothache.  States her Percocet is not helping.  Has an infected tooth.  Would like to get an Antibiotic for if possible.  Call to the [facility] on [facility].  Has an appointment with the Dentist on 08/10/2022.  Patient can be reached at [phone].  Would like something for the pain as well if possible.

## 2022-08-10 ENCOUNTER — Other Ambulatory Visit (HOSPITAL_COMMUNITY): Payer: Self-pay

## 2022-08-10 MED ORDER — CHLORHEXIDINE GLUCONATE 0.12 % MT SOLN
OROMUCOSAL | 0 refills | Status: DC
  Filled 2022-08-10: qty 473, 25d supply, fill #0

## 2022-08-10 MED ORDER — AMOXICILLIN 500 MG PO CAPS
500.0000 mg | ORAL_CAPSULE | Freq: Three times a day (TID) | ORAL | 0 refills | Status: DC
  Filled 2022-08-10: qty 21, 7d supply, fill #0

## 2022-08-11 ENCOUNTER — Other Ambulatory Visit (HOSPITAL_COMMUNITY): Payer: Self-pay

## 2022-08-17 ENCOUNTER — Other Ambulatory Visit: Payer: Self-pay

## 2022-08-17 ENCOUNTER — Ambulatory Visit (INDEPENDENT_AMBULATORY_CARE_PROVIDER_SITE_OTHER): Payer: Medicaid Other | Admitting: Student

## 2022-08-17 ENCOUNTER — Encounter: Payer: Self-pay | Admitting: Student

## 2022-08-17 ENCOUNTER — Other Ambulatory Visit (HOSPITAL_COMMUNITY): Payer: Self-pay

## 2022-08-17 VITALS — BP 139/77 | HR 57 | Temp 97.6°F | Ht 64.0 in | Wt 232.5 lb

## 2022-08-17 DIAGNOSIS — R7303 Prediabetes: Secondary | ICD-10-CM | POA: Diagnosis not present

## 2022-08-17 DIAGNOSIS — M7989 Other specified soft tissue disorders: Secondary | ICD-10-CM

## 2022-08-17 DIAGNOSIS — F1190 Opioid use, unspecified, uncomplicated: Secondary | ICD-10-CM

## 2022-08-17 DIAGNOSIS — I878 Other specified disorders of veins: Secondary | ICD-10-CM

## 2022-08-17 DIAGNOSIS — Z6839 Body mass index (BMI) 39.0-39.9, adult: Secondary | ICD-10-CM | POA: Diagnosis not present

## 2022-08-17 DIAGNOSIS — M351 Other overlap syndromes: Secondary | ICD-10-CM | POA: Diagnosis not present

## 2022-08-17 DIAGNOSIS — I10 Essential (primary) hypertension: Secondary | ICD-10-CM | POA: Diagnosis not present

## 2022-08-17 DIAGNOSIS — K047 Periapical abscess without sinus: Secondary | ICD-10-CM

## 2022-08-17 DIAGNOSIS — Z0000 Encounter for general adult medical examination without abnormal findings: Secondary | ICD-10-CM

## 2022-08-17 DIAGNOSIS — E669 Obesity, unspecified: Secondary | ICD-10-CM

## 2022-08-17 DIAGNOSIS — Z1231 Encounter for screening mammogram for malignant neoplasm of breast: Secondary | ICD-10-CM

## 2022-08-17 LAB — POCT GLYCOSYLATED HEMOGLOBIN (HGB A1C): Hemoglobin A1C: 5.7 % — AB (ref 4.0–5.6)

## 2022-08-17 LAB — GLUCOSE, CAPILLARY: Glucose-Capillary: 97 mg/dL (ref 70–99)

## 2022-08-17 MED ORDER — DICLOFENAC SODIUM 1 % EX GEL
4.0000 g | Freq: Four times a day (QID) | CUTANEOUS | 1 refills | Status: DC
  Filled 2022-08-17: qty 100, 6d supply, fill #0

## 2022-08-17 NOTE — Assessment & Plan Note (Addendum)
Assessment: Referral placed for mammogram. Recommended she schedule pap smear with our clinic.   Plan: - screening mammogram ordered - needs pap smear scheduled

## 2022-08-17 NOTE — Assessment & Plan Note (Addendum)
Assessment: Has medicaid and can now refer to rheumatology. During last appointment percocet was increased to  5-325 mg BID. She notes significant improvement in pain with this. We discussed how hopefully with rheumatology assistance able to transition off of chronic opioids due to side effects/risks.   Plan: - referral to rheumatology placed - continue percocet 5-325 mg BID. Toxassure today. PDMP reviewed and appropriate - voltaren gel for any MSK pain  Addendum: Toxassure consistent with percocet

## 2022-08-17 NOTE — Addendum Note (Signed)
Addended by: Riesa Pope on: 08/17/2022 06:16 PM   Modules accepted: Orders

## 2022-08-17 NOTE — Assessment & Plan Note (Signed)
Assessment: Dental extraction 3/11 of teeth what appears to be teeth 3 and 4. No further bleeding and has had improvement in swelling. The pain in her cheek is still present. This has been controlled with her percocet prescription. She will call to schedule a follow up. Prescribed amoxicillin 500 mg q8h, but due to GI side effects only able to tolerate 500 BID. Instructed to complete course of abx and if any changes to call her dentist. Also given strict return precautions if any swelling in her neck or changes in her breathing  Plan: - complete course of amoxicllin abx, initially complete date was today but with changing to BID dosing will complete this week - follow up with dentist

## 2022-08-17 NOTE — Assessment & Plan Note (Signed)
Assessment: During last visit started on amlodipine 5 mg daily. She denied adherence to this, she has not taken it for the past few days. She states she forgets. With how elevated her blood pressure has been she could benefit from a two drug regimen. Will check BMP today. She would like to wait a month while trying to be adherent to amlodipine before starting additional therapies.   Plan: - continue amlodipine 5 mg daily - BMP today, if unremarkable renal function, would add arb at next visit - she was given a blood pressure log discussed purchasing a cuff or using a friends

## 2022-08-17 NOTE — Progress Notes (Signed)
CC: follow up hypertension  HPI:  Sonya Smith is a 62 y.o. female living with a history stated below and presents today for hypertension. Please see problem based assessment and plan for additional details.  Past Medical History:  Diagnosis Date   Anxiety 10/09/2010   Arthritis    of cervical and lumbar spine   Asthma    undiagnosed, was told by a physician in the ED, patient does not take any inhalers at home.    Chest pain    multiple ED visit, no objective evidence of cardiac or pulmonary causes  of chest pain in past   Hypertension 04/06/2019   Restless leg syndrome 10/14/2017    Current Outpatient Medications on File Prior to Visit  Medication Sig Dispense Refill   amLODipine (NORVASC) 5 MG tablet Take 1 tablet (5 mg total) by mouth daily. 30 tablet 11   amoxicillin (AMOXIL) 500 MG capsule Take 1 capsule (500 mg total) by mouth every 8 (eight) hours. 21 capsule 0   aspirin EC 81 MG tablet Take 81 mg by mouth daily as needed for mild pain.     cephALEXin (KEFLEX) 500 MG capsule Take 1 capsule (500 mg total) by mouth 4 (four) times daily. 28 capsule 0   chlorhexidine (PERIDEX) 0.12 % solution Swish for one minute and expectorate twice a day for 10 days. 473 mL 0   diclofenac sodium (VOLTAREN) 1 % GEL Apply 2 g topically 4 (four) times daily as needed (muscle pain).     ibuprofen (ADVIL) 200 MG tablet Take 400 mg by mouth every 6 (six) hours as needed for moderate pain.     oxyCODONE-acetaminophen (PERCOCET/ROXICET) 5-325 MG tablet Take 1 tablet by mouth 2 (two) times daily as needed for severe pain. 60 tablet 0   No current facility-administered medications on file prior to visit.    Review of Systems: ROS negative except for what is noted on the assessment and plan.  Vitals:   08/17/22 0955 08/17/22 1005  BP: (!) 150/69 139/77  Pulse: (!) 58 (!) 57  Temp: 97.6 F (36.4 C)   TempSrc: Oral   SpO2: 97%   Weight: 232 lb 8 oz (105.5 kg)   Height: 5\' 4"  (1.626 m)      Physical Exam: Constitutional: well-appearing, in no acute distress HENT: normocephalic atraumatic, mucous membranes moist. Absent teeth 3/4 without erythema, swelling, bleeding, or pus. Eyes: conjunctiva non-erythematous Neck: supple Cardiovascular: regular rate and rhythm, no m/r/g Pulmonary/Chest: normal work of breathing on room air, lungs clear to auscultation bilaterally MSK: normal bulk and tone Neurological: alert & oriented x 3 Skin: warm and dry. Trace-1+ lower extremity edema. Chronic erythematous rash of upper extremities Psych: normal mood  Assessment & Plan:   Dental infection Assessment: Dental extraction 3/11 of teeth what appears to be teeth 3 and 4. No further bleeding and has had improvement in swelling. The pain in her cheek is still present. This has been controlled with her percocet prescription. She will call to schedule a follow up. Prescribed amoxicillin 500 mg q8h, but due to GI side effects only able to tolerate 500 BID. Instructed to complete course of abx and if any changes to call her dentist. Also given strict return precautions if any swelling in her neck or changes in her breathing  Plan: - complete course of amoxicllin abx, initially complete date was today but with changing to BID dosing will complete this week - follow up with dentist  Venous stasis of  both lower extremities Assessment: Continues to have lower extremity edema, this occurs after being on her feet all day. On exam today 1+ bilaterally. Has tried compression stocking in the past but unable to tolerate 2/2 pain. ABI's today normal at 1.14 and 1.15. Do not suspect HF or DVT.   Plan: - continue to monitor and encourage compression stockings as tolerable and elevating her legs when able  Hypertension Assessment: During last visit started on amlodipine 5 mg daily. She denied adherence to this, she has not taken it for the past few days. She states she forgets. With how elevated her  blood pressure has been she could benefit from a two drug regimen. Will check BMP today. She would like to wait a month while trying to be adherent to amlodipine before starting additional therapies.   Plan: - continue amlodipine 5 mg daily - BMP today, if unremarkable renal function, would add arb at next visit - she was given a blood pressure log discussed purchasing a cuff or using a friends  Mixed connective tissue disease ([place]) Assessment: Has medicaid and can now refer to rheumatology. During last appointment percocet was increased to  5-325 mg BID. She notes significant improvement in pain with this. We discussed how hopefully with rheumatology assistance able to transition off of chronic opioids due to side effects/risks.   Plan: - referral to rheumatology placed - continue percocet 5-325 mg BID. Toxassure today  Obesity Assessment: BMI today of 39 kg/m2. Has not had A1c, lipid panel, or bmp assessed in some time. She is interested in this now that she is insured  Plan: - check A1c, lipid panel, BMP  Pre-diabetes Assessment: A1c today of 5.7%. No prior diagnosis of pre-diabetes. Will call patient and discuss lifestyle modifications  Plan: - repeat A1c in 6 months - encourage lifestyle modifications  Healthcare maintenance Assessment: Referral placed for mammogram. Recommended she schedule pap smear with our clinic.   Plan: - screening mammogram ordered - needs pap smear scheduled  Patient discussed with Dr. Verlee Rossetti, D.O. [facility], PGY-3 Phone: [phone] Date 08/17/2022 Time 1:29 PM

## 2022-08-17 NOTE — Assessment & Plan Note (Signed)
Assessment: BMI today of 39 kg/m2. Has not had A1c, lipid panel, or bmp assessed in some time. She is interested in this now that she is insured  Plan: - check A1c, lipid panel, BMP  Addendum ASCVD risk of 2.1%. Triglycerides elevated but lipid panel was not fasting. She currently meets 2 criteria for metabolic syndrome (elevated BP and low HDL). Will need to follow up if she was fasting yesterday prior to lipid panel and measure waist circumference at next visit. Overall lifestyle changes would decrease her risk for progression to diabetes and decrease risk for a cardiovascular event

## 2022-08-17 NOTE — Patient Instructions (Signed)
Thank you, [name] for allowing [place] to provide your care today. Today we discussed .  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 Please follow up with rheumatology.   Dental pain Please call the dentist and try to be seen this week or the next. If you notice any fever, chills, worsening swelling, pain, please call them to be seen sooner. Any changes in breathing or neck swelling, please go to the emergency department  High blood pressure Please continue the amlodipine. We will check your kidney function and if normal will add another blood pressure medicine. If you notice getting lightheaded and dizzy please call us. If you have a blood pressure medicine check your blood pressures 1-2 times a week and record them. Bring them to your follow up in 1 month. If you do not have one, I recommend getting an arm (brachial) blood pressure cuff. Please avoid wrist cuffs.   We will be checking your cholesterol levels, kidney function and assess for diabetes    I have ordered the following labs for you:  Lab Orders         BMP8+Anion Gap         Lipid Profile         ToxAssure Select,+Antidepr,UR         POC Hbg A1C      Referrals ordered today:   Referral Orders         Ambulatory referral to Rheumatology      I have ordered the following medication/changed the following medications:   Stop the following medications: There are no discontinued medications.   Start the following medications: No orders of the defined types were placed in this encounter.    Follow up: 1 month blood pressure follow up    Should you have any questions or concerns please call the internal medicine clinic at [phone].    Sanjuana Letters, D.O. [place]

## 2022-08-17 NOTE — Assessment & Plan Note (Signed)
Assessment: A1c today of 5.7%. No prior diagnosis of pre-diabetes. Will call patient and discuss lifestyle modifications  Plan: - repeat A1c in 6 months - encourage lifestyle modifications

## 2022-08-17 NOTE — Assessment & Plan Note (Signed)
Assessment: Continues to have lower extremity edema, this occurs after being on her feet all day. On exam today 1+ bilaterally. Has tried compression stocking in the past but unable to tolerate 2/2 pain. ABI's today normal at 1.14 and 1.15. Do not suspect HF or DVT.   Plan: - continue to monitor and encourage compression stockings as tolerable and elevating her legs when able

## 2022-08-18 ENCOUNTER — Other Ambulatory Visit (HOSPITAL_COMMUNITY): Payer: Self-pay

## 2022-08-18 LAB — BMP8+ANION GAP
Anion Gap: 16 mmol/L (ref 10.0–18.0)
BUN/Creatinine Ratio: 19 (ref 9–23)
BUN: 13 mg/dL (ref 6–24)
CO2: 23 mmol/L (ref 20–29)
Calcium: 9 mg/dL (ref 8.7–10.2)
Chloride: 102 mmol/L (ref 96–106)
Creatinine, Ser: 0.69 mg/dL (ref 0.57–1.00)
Glucose: 93 mg/dL (ref 70–99)
Potassium: 4.5 mmol/L (ref 3.5–5.2)
Sodium: 141 mmol/L (ref 134–144)
eGFR: 100 mL/min/{1.73_m2} (ref >59)

## 2022-08-18 LAB — LIPID PANEL
Chol/HDL Ratio: 4.9 ratio — ABNORMAL HIGH (ref 0.0–4.4)
Cholesterol, Total: 181 mg/dL (ref 100–199)
HDL: 37 mg/dL — ABNORMAL LOW (ref >39)
LDL Chol Calc (NIH): 103 mg/dL — ABNORMAL HIGH (ref 0–99)
Triglycerides: 236 mg/dL — ABNORMAL HIGH (ref 0–149)
VLDL Cholesterol Cal: 41 mg/dL — ABNORMAL HIGH (ref 5–40)

## 2022-08-19 NOTE — Progress Notes (Signed)
Internal Medicine Clinic Attending  Case discussed with Dr. Johnney Ou  At the time of the visit.  We reviewed the resident's history and exam and pertinent patient test results.  I agree with the assessment, diagnosis, and plan of care documented in the resident's note.    At 4/18 appointment, please consider starting ACE/ARB and do Pap.

## 2022-08-20 LAB — TOXASSURE SELECT,+ANTIDEPR,UR

## 2022-08-26 ENCOUNTER — Other Ambulatory Visit (HOSPITAL_COMMUNITY): Payer: Self-pay

## 2022-08-26 ENCOUNTER — Other Ambulatory Visit: Payer: Self-pay

## 2022-08-26 DIAGNOSIS — Z79891 Long term (current) use of opiate analgesic: Secondary | ICD-10-CM

## 2022-08-26 DIAGNOSIS — M351 Other overlap syndromes: Secondary | ICD-10-CM

## 2022-08-26 MED ORDER — OXYCODONE-ACETAMINOPHEN 5-325 MG PO TABS
1.0000 | ORAL_TABLET | Freq: Two times a day (BID) | ORAL | 0 refills | Status: DC | PRN
  Filled 2022-08-26: qty 60, 30d supply, fill #0

## 2022-08-27 ENCOUNTER — Other Ambulatory Visit (HOSPITAL_COMMUNITY): Payer: Self-pay

## 2022-09-17 ENCOUNTER — Other Ambulatory Visit: Payer: Self-pay

## 2022-09-17 ENCOUNTER — Ambulatory Visit: Payer: Medicaid Other

## 2022-09-17 ENCOUNTER — Other Ambulatory Visit (HOSPITAL_COMMUNITY): Payer: Self-pay

## 2022-09-17 VITALS — BP 135/74 | HR 83 | Temp 98.1°F | Ht 64.0 in | Wt 231.6 lb

## 2022-09-17 DIAGNOSIS — K047 Periapical abscess without sinus: Secondary | ICD-10-CM | POA: Diagnosis not present

## 2022-09-17 DIAGNOSIS — I10 Essential (primary) hypertension: Secondary | ICD-10-CM

## 2022-09-17 DIAGNOSIS — M351 Other overlap syndromes: Secondary | ICD-10-CM | POA: Diagnosis not present

## 2022-09-17 DIAGNOSIS — I878 Other specified disorders of veins: Secondary | ICD-10-CM

## 2022-09-17 MED ORDER — OLMESARTAN MEDOXOMIL 5 MG PO TABS
5.0000 mg | ORAL_TABLET | Freq: Every day | ORAL | 2 refills | Status: DC
Start: 2022-09-17 — End: 2022-12-18
  Filled 2022-09-17: qty 30, 30d supply, fill #0
  Filled 2022-10-13 – 2022-10-14 (×2): qty 30, 30d supply, fill #1
  Filled 2022-11-16: qty 30, 30d supply, fill #2

## 2022-09-17 NOTE — Assessment & Plan Note (Signed)
Patient has referral for rheumatology.  Continues to endorse good pain control with Percocet though our goal is for her to be weaned from this perhaps with disease modifying therapy.  Tox assure consistent with Percocet at last follow-up. -Provided number to schedule rheumatology appointment

## 2022-09-17 NOTE — Progress Notes (Signed)
CC: Blood pressure follow-up  HPI:  [name] is a 62 y.o. with past medical history as below who presents for blood pressure follow-up.  Please see detailed assessment and plan for HPI.   Past Medical History:  Diagnosis Date   Anxiety 10/09/2010   Arthritis    of cervical and lumbar spine   Asthma    undiagnosed, was told by a physician in the ED, patient does not take any inhalers at home.    Chest pain    multiple ED visit, no objective evidence of cardiac or pulmonary causes  of chest pain in past   Hypertension 04/06/2019   Restless leg syndrome 10/14/2017   Review of Systems: See detailed assessment and plan for pertinent ROS.  Physical Exam:  Vitals:   09/17/22 1030 09/17/22 1101  BP: (!) 165/98 135/74  Pulse: 83   Temp: 98.1 F (36.7 C)   TempSrc: Oral   SpO2: 97%   Weight: 231 lb 9.6 oz (105.1 kg)   Height:  (1.626 m)    Physical Exam Constitutional:      General: She is not in acute distress. HENT:     Head: Normocephalic and atraumatic.  Eyes:     Extraocular Movements: Extraocular movements intact.  Cardiovascular:     Rate and Rhythm: Normal rate and regular rhythm.     Heart sounds: No murmur heard. Pulmonary:     Effort: Pulmonary effort is normal.     Breath sounds: No wheezing, rhonchi or rales.  Musculoskeletal:     Cervical back: Neck supple.     Right lower leg: No edema.     Left lower leg: No edema.     Comments: Trace pitting edema at bilateral lower extremities.  Skin:    General: Skin is warm and dry.     Comments: 2x3 cm flat, erythematous lesion at right anterior shin. No purulent drainage. Similar coloration to surrounding venous stasis changes. No increased warmth. Smaller subcutaneous nodule deep to rash.  Neurological:     Mental Status: She is alert and oriented to person, place, and time.  Psychiatric:        Mood and Affect: Mood normal.        Behavior: Behavior normal.      The 10-year ASCVD risk score  (Arnett DK, et al., 2019) is: 5.4%   Values used to calculate the score:     Age: 53 years     Sex: Female     Is Non-Hispanic African American: No     Diabetic: No     Tobacco smoker: No     Systolic Blood Pressure: 135 mmHg     Is BP treated: Yes     HDL Cholesterol: 37 mg/dL     Total Cholesterol: 181 mg/dL   Assessment & Plan:   See Encounters Tab for problem based charting.  Hypertension Patient presents for follow-up of her hypertension.  Her blood pressure was initially 165/98 and then 135/74 on repeat.  She reports her home pressures have ranged in the 130s to 140s.  Endorses 1 episode where she felt like she might of been hypotensive but did not take her blood pressure at that time (felt dizzy).  She endorses good medication compliance with her amlodipine 5 mg daily.  Denies shortness of breath, headache, vision changes.  Does endorse some chest pain but is presented multiple times for this in the past.  Her chest pain is atypical which she describes  as a quiver that is over her central chest and is not modified by exertion or rest.  It does not radiate.  It is not reproducible on exam.  She does not currently have chest pain.  Recent BNP was normal renal function and electrolytes. -Continue amlodipine 5 mg daily and add olmesartan 5 mg daily -Return to clinic in about a month for blood pressure check  Venous stasis of both lower extremities Patient continues to have trace bilateral lower extremity edema.  She has not been able to try compression socks due to pain.  ABIs normal at last follow-up.  Denies shortness of breath, saturating well on room air, normal breath sounds.  Do not suspect heart failure.  Additionally, do not suspect DVT given anterior location of new rash, symmetric swelling, and no increased warmth.  The new 2 x 3 cm erythematous lesion of the right anterior shin appears largely similar to other venous stasis changes.  There is a small nodule deep to the tender  rash that may or may not be related to her mixed connective tissue disease. -Continue to encourage compression socks and elevation -Encourage patient to discuss subcutaneous nodule with rheumatology  Mixed connective tissue disease [facility]) Patient has referral for rheumatology.  Continues to endorse good pain control with Percocet though our goal is for her to be weaned from this perhaps with disease modifying therapy.  Tox assure consistent with Percocet at last follow-up. -Provided number to schedule rheumatology appointment  Dental infection Patient with dental extraction 08/10/2022.  Instructed to continue antibiotics and given strict return precautions at last follow-up.  Today, she endorses complete resolution of symptoms.  Patient seen with Dr.  Lafonda Mosses

## 2022-09-17 NOTE — Assessment & Plan Note (Signed)
Patient with dental extraction 08/10/2022.  Instructed to continue antibiotics and given strict return precautions at last follow-up.  Today, she endorses complete resolution of symptoms.

## 2022-09-17 NOTE — Assessment & Plan Note (Signed)
Patient presents for follow-up of her hypertension.  Her blood pressure was initially 165/98 and then 135/74 on repeat.  She reports her home pressures have ranged in the 130s to 140s.  Endorses 1 episode where she felt like she might of been hypotensive but did not take her blood pressure at that time (felt dizzy).  She endorses good medication compliance with her amlodipine 5 mg daily.  Denies shortness of breath, headache, vision changes.  Does endorse some chest pain but is presented multiple times for this in the past.  Her chest pain is atypical which she describes as a quiver that is over her central chest and is not modified by exertion or rest.  It does not radiate.  It is not reproducible on exam.  She does not currently have chest pain.  Recent BNP was normal renal function and electrolytes. -Continue amlodipine 5 mg daily and add olmesartan 5 mg daily -Return to clinic in about a month for blood pressure check

## 2022-09-17 NOTE — Patient Instructions (Signed)
[name], it was a pleasure seeing you today!  Today we discussed: Blood pressure - take olmesartan once daily in addition to amlodipine Sore on shin - Use emollient (like vaseline). Discuss at appointment with rheumatology. Wear compression socks for venous insufficiency. Let us know if you develop fever, chills, or if the sore worsens with pus or other worrisome changes.  [facility] Rheumatologist in [place], [place] Located in: [place] office suites Address: [address], [place], [place] [address]  Phone: [phone]  I have ordered the following labs today:  Lab Orders  No laboratory test(s) ordered today     Tests ordered today:  none  Referrals ordered today:   Referral Orders  No referral(s) requested today     I have ordered the following medication/changed the following medications:   Stop the following medications: There are no discontinued medications.   Start the following medications: Meds ordered this encounter  Medications   olmesartan (BENICAR) 5 MG tablet    Sig: Take 1 tablet (5 mg total) by mouth daily.    Dispense:  30 tablet    Refill:  2     Follow-up:  2-4 weeks    Please make sure to arrive 15 minutes prior to your next appointment. If you arrive late, you may be asked to reschedule.   We look forward to seeing you next time. Please call our clinic at [phone] if you have any questions or concerns. The best time to call is Monday-Friday from 9am-4pm, but there is someone available 24/7. If after hours or the weekend, call the main hospital number and ask for the Internal Medicine Resident On-Call. If you need medication refills, please notify your pharmacy one week in advance and they will send [place] a request.  Thank you for letting us take part in your care. Wishing you the best!  Thank you, Adron Bene, MD

## 2022-09-17 NOTE — Assessment & Plan Note (Signed)
Patient continues to have trace bilateral lower extremity edema.  She has not been able to try compression socks due to pain.  ABIs normal at last follow-up.  Denies shortness of breath, saturating well on room air, normal breath sounds.  Do not suspect heart failure.  Additionally, do not suspect DVT given anterior location of new rash, symmetric swelling, and no increased warmth.  The new 2 x 3 cm erythematous lesion of the right anterior shin appears largely similar to other venous stasis changes.  There is a small nodule deep to the tender rash that may or may not be related to her mixed connective tissue disease. -Continue to encourage compression socks and elevation -Encourage patient to discuss subcutaneous nodule with rheumatology

## 2022-09-18 NOTE — Progress Notes (Signed)
Internal Medicine Clinic Attending   I saw and evaluated the patient.  I personally confirmed the key portions of the history and exam documented by Dr. White and I reviewed pertinent patient test results.  The assessment, diagnosis, and plan were formulated together and I agree with the documentation in the resident's note.  

## 2022-09-23 ENCOUNTER — Other Ambulatory Visit: Payer: Self-pay | Admitting: Student

## 2022-09-23 DIAGNOSIS — Z79891 Long term (current) use of opiate analgesic: Secondary | ICD-10-CM

## 2022-09-23 DIAGNOSIS — M351 Other overlap syndromes: Secondary | ICD-10-CM

## 2022-09-23 MED ORDER — OXYCODONE-ACETAMINOPHEN 5-325 MG PO TABS
1.0000 | ORAL_TABLET | Freq: Two times a day (BID) | ORAL | 0 refills | Status: DC | PRN
Start: 2022-09-23 — End: 2022-10-23
  Filled 2022-09-23: qty 60, 30d supply, fill #0

## 2022-09-23 NOTE — Telephone Encounter (Signed)
oxyCODONE-acetaminophen (PERCOCET/ROXICET) 5-325 MG tablet   [place] [facility]

## 2022-09-23 NOTE — Telephone Encounter (Signed)
PDMP appropriate. Will refill, last fill date of 3/27.

## 2022-09-24 ENCOUNTER — Other Ambulatory Visit (HOSPITAL_COMMUNITY): Payer: Self-pay

## 2022-10-02 ENCOUNTER — Ambulatory Visit
Admission: RE | Admit: 2022-10-02 | Discharge: 2022-10-02 | Disposition: A | Discharge status: Home / Self Care | Payer: Medicaid Other | Source: Ambulatory Visit | Attending: Internal Medicine | Admitting: Internal Medicine

## 2022-10-02 DIAGNOSIS — Z1231 Encounter for screening mammogram for malignant neoplasm of breast: Secondary | ICD-10-CM

## 2022-10-13 ENCOUNTER — Other Ambulatory Visit (HOSPITAL_COMMUNITY): Payer: Self-pay

## 2022-10-14 ENCOUNTER — Other Ambulatory Visit (HOSPITAL_COMMUNITY): Payer: Self-pay

## 2022-10-14 ENCOUNTER — Other Ambulatory Visit: Payer: Self-pay

## 2022-10-16 ENCOUNTER — Encounter: Payer: Medicaid Other | Admitting: Student

## 2022-10-22 ENCOUNTER — Other Ambulatory Visit: Payer: Self-pay

## 2022-10-22 DIAGNOSIS — M351 Other overlap syndromes: Secondary | ICD-10-CM

## 2022-10-22 DIAGNOSIS — Z79891 Long term (current) use of opiate analgesic: Secondary | ICD-10-CM

## 2022-10-23 ENCOUNTER — Other Ambulatory Visit (HOSPITAL_COMMUNITY): Payer: Self-pay

## 2022-10-23 ENCOUNTER — Encounter: Payer: Self-pay | Admitting: Student

## 2022-10-23 ENCOUNTER — Other Ambulatory Visit: Payer: Self-pay

## 2022-10-23 ENCOUNTER — Ambulatory Visit (INDEPENDENT_AMBULATORY_CARE_PROVIDER_SITE_OTHER): Payer: Medicaid Other | Admitting: Student

## 2022-10-23 VITALS — BP 144/77 | HR 53 | Temp 97.9°F | Ht 64.0 in | Wt 229.6 lb

## 2022-10-23 DIAGNOSIS — I10 Essential (primary) hypertension: Secondary | ICD-10-CM

## 2022-10-23 DIAGNOSIS — Z79891 Long term (current) use of opiate analgesic: Secondary | ICD-10-CM | POA: Diagnosis not present

## 2022-10-23 DIAGNOSIS — M351 Other overlap syndromes: Secondary | ICD-10-CM | POA: Diagnosis not present

## 2022-10-23 MED ORDER — OXYCODONE-ACETAMINOPHEN 5-325 MG PO TABS
1.0000 | ORAL_TABLET | Freq: Two times a day (BID) | ORAL | 0 refills | Status: DC | PRN
Start: 2022-10-23 — End: 2022-11-24
  Filled 2022-10-23: qty 60, 30d supply, fill #0

## 2022-10-23 NOTE — Progress Notes (Signed)
CC: Mixed connective tissue disease follow-up  HPI:  [name] is a 62 y.o. female with a past medical history of hypertension, chronic pain prediabetes who presents for follow-up for pain contract.  Please see assessment and plan for full HPI.  Hypertension: Amlodipine 5 mg daily, olmesartan 5 mg daily Mixed connective tissue disease: Percocet 5-325 tablet twice daily as needed  Past Medical History:  Diagnosis Date   Anxiety 10/09/2010   Arthritis    of cervical and lumbar spine   Asthma    undiagnosed, was told by a physician in the ED, patient does not take any inhalers at home.    Chest pain    multiple ED visit, no objective evidence of cardiac or pulmonary causes  of chest pain in past   Hypertension 04/06/2019   Restless leg syndrome 10/14/2017     Current Outpatient Medications:    amLODipine (NORVASC) 5 MG tablet, Take 1 tablet (5 mg total) by mouth daily., Disp: 30 tablet, Rfl: 11   aspirin EC 81 MG tablet, Take 81 mg by mouth daily as needed for mild pain., Disp: , Rfl:    chlorhexidine (PERIDEX) 0.12 % solution, Swish for one minute and expectorate twice a day for 10 days., Disp: 473 mL, Rfl: 0   diclofenac sodium (VOLTAREN) 1 % GEL, Apply 2 g topically 4 (four) times daily as needed (muscle pain)., Disp: , Rfl:    diclofenac Sodium (VOLTAREN) 1 % GEL, Apply 4 g topically 4 (four) times daily., Disp: 50 g, Rfl: 1   ibuprofen (ADVIL) 200 MG tablet, Take 400 mg by mouth every 6 (six) hours as needed for moderate pain., Disp: , Rfl:    olmesartan (BENICAR) 5 MG tablet, Take 1 tablet (5 mg total) by mouth daily., Disp: 30 tablet, Rfl: 2   oxyCODONE-acetaminophen (PERCOCET/ROXICET) 5-325 MG tablet, Take 1 tablet by mouth 2 (two) times daily as needed for severe pain., Disp: 60 tablet, Rfl: 0  Review of Systems:    MSK: Patient endorses back pain, neck pain, leg pain, hand pain, arm pain Physical Exam:  Vitals:   10/23/22 1102 10/23/22 1103 10/23/22 1132  BP:   (!) 142/77 (!) 144/77  Pulse:  61 (!) 53  Temp:  97.9 F (36.6 C)   TempSrc:  Oral   SpO2:  99%   Weight: 229 lb 9.6 oz (104.1 kg)    Height: 5\' 4"  (1.626 m)      General: Patient is sitting comfortably in the room  Head: Normocephalic, atraumatic  Cardio: Regular rate and rhythm, no murmurs, rubs or gallops. 2+ pulses to bilateral upper and lower extremities  Pulmonary: Clear to ausculation bilaterally with no rales, rhonchi, and crackles  MSK: Decreased grip strength noted bilaterally.  Full active and passive range of motion of upper and lower extremities with pain.  Assessment & Plan:   Mixed connective tissue disease (HCC) Patient presents for follow-up on mixed connective tissue disease.  Patient endorses good pain control with Percocet.  Goal will be to wean patient off Percocet which patient establishes with rheumatology.  Patient does have rheumatology appointment scheduled for 02/15/2023 with Dr. Sheliah Hatch.  Patient states that with this Percocet daily, she is able to have a normal life.  PDMP reviewed today.  Patient has appropriate refills.  Updated pain contract today.  Scanned into chart.  Will defer UDS today as previous UDS 2 months ago.  Plan: -Refill Percocet 5 mg twice daily as needed -Updated pain contract -  Follow-up rheumatology 02/15/2023 -Follow-up in 3 months -Patient can get refills over the next 2 months without being seen  Hypertension Patient had elevated blood pressure today.  Current blood pressure regimen includes amlodipine 5 milligram daily and olmesartan 5 mg daily.  Olmesartan was started at last visit 1 month ago.  She states she did not take her medication this morning prior to coming to the office.  Blood pressure today 142/77 and on repeat 144/77.  She denies any chest pain, shortness of breath, vision changes, or headaches.  She endorses she checks her blood pressure at home, and at home it is normally in the 120s over 70s.  Patient  encouraged to take blood pressure medicine prior to office visit.  Patient agrees with this plan.  Given ARB restarted 1 month ago, will repeat kidney function and electrolytes today.  Plan: -Continue amlodipine 5 mg daily -Continue olmesartan 5 mg daily -Continue monitor blood pressure -BMP pending today  Patient discussed with Dr. Dickie La  Modena Slater, DO PGY-1 Internal Medicine Resident  Pager: [phone]

## 2022-10-23 NOTE — Assessment & Plan Note (Addendum)
Patient had elevated blood pressure today.  Current blood pressure regimen includes amlodipine 5 milligram daily and olmesartan 5 mg daily.  Olmesartan was started at last visit 1 month ago.  She states she did not take her medication this morning prior to coming to the office.  Blood pressure today 142/77 and on repeat 144/77.  She denies any chest pain, shortness of breath, vision changes, or headaches.  She endorses she checks her blood pressure at home, and at home it is normally in the 120s over 70s.  Patient encouraged to take blood pressure medicine prior to office visit.  Patient agrees with this plan.  Given ARB restarted 1 month ago, will repeat kidney function and electrolytes today.  Plan: -Continue amlodipine 5 mg daily -Continue olmesartan 5 mg daily -Continue monitor blood pressure -BMP pending today  Addendum: -BMP within normal limits

## 2022-10-23 NOTE — Assessment & Plan Note (Signed)
Patient presents for follow-up on mixed connective tissue disease.  Patient endorses good pain control with Percocet.  Goal will be to wean patient off Percocet which patient establishes with rheumatology.  Patient does have rheumatology appointment scheduled for 02/15/2023 with Dr. Sheliah Hatch.  Patient states that with this Percocet daily, she is able to have a normal life.  PDMP reviewed today.  Patient has appropriate refills.  Updated pain contract today.  Scanned into chart.  Will defer UDS today as previous UDS 2 months ago.  Plan: -Refill Percocet 5 mg twice daily as needed -Updated pain contract -Follow-up rheumatology 02/15/2023 -Follow-up in 3 months -Patient can get refills over the next 2 months without being seen

## 2022-10-23 NOTE — Patient Instructions (Signed)
Sonya Smith you for allowing me to take part in your care today.  Here are your instructions.  1.  Regarding your mixed connective tissue disease, I am going to refill your Percocet 5 mg twice daily as needed.  Please pick this up from your pharmacy.  Remember your appointment with rheumatology is on February 15, 2023.  Please go to the appointment.  2.  For your high blood pressure, please continue checking your blood pressure at home.  Please continue taking your medications as prescribed.  If you need refills please call the clinic.  I am checking lab work today, I will call you with the results.  3.  Please follow-up in 3 months.  Thank you, Dr. Allena Katz  If you have any other questions please contact the internal medicine clinic at [phone]

## 2022-10-24 LAB — BMP8+ANION GAP
Anion Gap: 15 mmol/L (ref 10.0–18.0)
BUN/Creatinine Ratio: 20 (ref 9–23)
BUN: 15 mg/dL (ref 6–24)
CO2: 24 mmol/L (ref 20–29)
Calcium: 9.5 mg/dL (ref 8.7–10.2)
Chloride: 102 mmol/L (ref 96–106)
Creatinine, Ser: 0.75 mg/dL (ref 0.57–1.00)
Glucose: 132 mg/dL — ABNORMAL HIGH (ref 70–99)
Potassium: 4.5 mmol/L (ref 3.5–5.2)
Sodium: 141 mmol/L (ref 134–144)
eGFR: 92 mL/min/{1.73_m2} (ref >59)

## 2022-10-27 ENCOUNTER — Encounter: Payer: Self-pay | Admitting: Student

## 2022-10-28 NOTE — Progress Notes (Signed)
Internal Medicine Clinic Attending  Case discussed with Dr. Patel  At the time of the visit.  We reviewed the resident's history and exam and pertinent patient test results.  I agree with the assessment, diagnosis, and plan of care documented in the resident's note.  

## 2022-11-02 ENCOUNTER — Encounter: Payer: Self-pay | Admitting: Student

## 2022-11-16 ENCOUNTER — Other Ambulatory Visit (HOSPITAL_COMMUNITY): Payer: Self-pay

## 2022-11-20 ENCOUNTER — Telehealth: Payer: Self-pay | Admitting: Internal Medicine

## 2022-11-20 NOTE — Telephone Encounter (Signed)
Received page from operator as patient is requesting return phone call. I have called the patient but been unable to reach her at this time.  Champ Mungo, DO Internal Medicine PGY-2

## 2022-11-23 ENCOUNTER — Other Ambulatory Visit (HOSPITAL_COMMUNITY): Payer: Self-pay

## 2022-11-24 ENCOUNTER — Other Ambulatory Visit (HOSPITAL_COMMUNITY): Payer: Self-pay

## 2022-11-24 ENCOUNTER — Other Ambulatory Visit: Payer: Self-pay | Admitting: Student

## 2022-11-24 ENCOUNTER — Telehealth: Payer: Self-pay

## 2022-11-24 DIAGNOSIS — M351 Other overlap syndromes: Secondary | ICD-10-CM

## 2022-11-24 DIAGNOSIS — Z79891 Long term (current) use of opiate analgesic: Secondary | ICD-10-CM

## 2022-11-24 MED ORDER — OXYCODONE-ACETAMINOPHEN 5-325 MG PO TABS
1.0000 | ORAL_TABLET | Freq: Two times a day (BID) | ORAL | 0 refills | Status: DC | PRN
Start: 2022-11-24 — End: 2022-12-22
  Filled 2022-11-24: qty 60, 30d supply, fill #0

## 2022-11-24 NOTE — Telephone Encounter (Signed)
Pt is requesting her    oxyCODONE-acetaminophen (PERCOCET/ROXICET) 5-325 MG     To be sent to  [facility]          MED IS NOT ON HER MED LIST

## 2022-11-24 NOTE — Telephone Encounter (Signed)
Oxycodone 5/325 mg  Take 1 tablet by mouth 2 (two) times daily as needed for severe pain had expired and dropped off current med list. Last rx written 10/23/22. Last OV 10/23/22. Next OV has not been scheduled. TOX 08/17/22.

## 2022-11-24 NOTE — Telephone Encounter (Signed)
Patient called she is requesting her rx to be sent to [facility] on [address] due to [facility] being [facility] at the moment. Patient stated she is out of the medication and is requesting the refill to be sent asap.

## 2022-11-25 ENCOUNTER — Other Ambulatory Visit (HOSPITAL_COMMUNITY): Payer: Self-pay

## 2022-11-25 NOTE — Telephone Encounter (Signed)
Pt called about her pain med - I informed pt it was sent to [facility]. Stated she will call the pharmacy.

## 2022-12-15 ENCOUNTER — Telehealth: Payer: Self-pay

## 2022-12-15 NOTE — Telephone Encounter (Signed)
Chart review completed for patient. Patient is due for cervical cancer screen. LVM to inquire about setting up appointment. Elijio Miles, Population Health Specialist.

## 2022-12-18 ENCOUNTER — Other Ambulatory Visit (HOSPITAL_COMMUNITY): Payer: Self-pay

## 2022-12-18 ENCOUNTER — Other Ambulatory Visit: Payer: Self-pay

## 2022-12-18 DIAGNOSIS — I10 Essential (primary) hypertension: Secondary | ICD-10-CM

## 2022-12-18 MED ORDER — OLMESARTAN MEDOXOMIL 5 MG PO TABS
5.0000 mg | ORAL_TABLET | Freq: Every day | ORAL | 3 refills | Status: DC
Start: 2022-12-18 — End: 2023-12-20
  Filled 2022-12-18: qty 30, 30d supply, fill #0
  Filled 2023-01-21: qty 30, 30d supply, fill #1
  Filled 2023-02-22: qty 30, 30d supply, fill #2
  Filled 2023-03-22: qty 30, 30d supply, fill #3
  Filled 2023-04-23: qty 30, 30d supply, fill #4
  Filled 2023-05-24 – 2023-05-27 (×2): qty 30, 30d supply, fill #5
  Filled 2023-06-22: qty 30, 30d supply, fill #6
  Filled 2023-07-26: qty 30, 30d supply, fill #7
  Filled 2023-08-23: qty 30, 30d supply, fill #8
  Filled 2023-09-23: qty 30, 30d supply, fill #9
  Filled 2023-10-22: qty 30, 30d supply, fill #10
  Filled 2023-11-22: qty 30, 30d supply, fill #11

## 2022-12-18 NOTE — Telephone Encounter (Signed)
Pt calling to f/u with her refill request    olmesartan (BENICAR) 5 MG tablet  [place] [facility]

## 2022-12-21 ENCOUNTER — Other Ambulatory Visit: Payer: Self-pay

## 2022-12-21 ENCOUNTER — Other Ambulatory Visit (HOSPITAL_COMMUNITY): Payer: Self-pay

## 2022-12-22 ENCOUNTER — Other Ambulatory Visit: Payer: Self-pay | Admitting: Student

## 2022-12-22 DIAGNOSIS — Z79891 Long term (current) use of opiate analgesic: Secondary | ICD-10-CM

## 2022-12-22 DIAGNOSIS — M351 Other overlap syndromes: Secondary | ICD-10-CM

## 2022-12-22 NOTE — Telephone Encounter (Signed)
oxyCODONE-acetaminophen (PERCOCET/ROXICET) 5-325 MG tablet   [place] [facility]

## 2022-12-23 ENCOUNTER — Other Ambulatory Visit (HOSPITAL_COMMUNITY): Payer: Self-pay

## 2022-12-23 MED ORDER — OXYCODONE-ACETAMINOPHEN 5-325 MG PO TABS
1.0000 | ORAL_TABLET | Freq: Two times a day (BID) | ORAL | 0 refills | Status: DC | PRN
Start: 2022-12-23 — End: 2023-01-22
  Filled 2022-12-23: qty 60, 30d supply, fill #0

## 2023-01-21 ENCOUNTER — Other Ambulatory Visit (HOSPITAL_COMMUNITY): Payer: Self-pay

## 2023-01-21 ENCOUNTER — Other Ambulatory Visit: Payer: Self-pay | Admitting: Internal Medicine

## 2023-01-21 ENCOUNTER — Other Ambulatory Visit: Payer: Self-pay

## 2023-01-21 DIAGNOSIS — Z79891 Long term (current) use of opiate analgesic: Secondary | ICD-10-CM

## 2023-01-21 DIAGNOSIS — M351 Other overlap syndromes: Secondary | ICD-10-CM

## 2023-01-22 ENCOUNTER — Other Ambulatory Visit (HOSPITAL_COMMUNITY): Payer: Self-pay

## 2023-01-22 ENCOUNTER — Other Ambulatory Visit: Payer: Self-pay | Admitting: Internal Medicine

## 2023-01-22 DIAGNOSIS — M351 Other overlap syndromes: Secondary | ICD-10-CM

## 2023-01-22 DIAGNOSIS — Z79891 Long term (current) use of opiate analgesic: Secondary | ICD-10-CM

## 2023-01-22 MED ORDER — OXYCODONE-ACETAMINOPHEN 5-325 MG PO TABS
1.0000 | ORAL_TABLET | Freq: Two times a day (BID) | ORAL | 0 refills | Status: DC | PRN
Start: 2023-01-22 — End: 2023-02-22
  Filled 2023-01-22: qty 60, 30d supply, fill #0

## 2023-01-22 NOTE — Progress Notes (Signed)
Patient with PMH of mixed connective tissue disorder requesting refill for oxycodone. Plan is to down titrate medication after she establishes care with rheumatology in September. PDMP reviewed and appropriate. Last toxassure 3/24.   Oxycodone-acetaminophen 5-325 mg refilled.

## 2023-02-15 ENCOUNTER — Encounter: Payer: Medicaid Other | Admitting: Internal Medicine

## 2023-02-15 NOTE — Progress Notes (Deleted)
Office Visit Note  Patient: Sonya Smith             Date of Birth: [birth year]           MRN: [id number]             PCP: Morrie Sheldon, MD Referring: Mercie Eon, MD Visit Date: 02/15/2023 Occupation: @GUAROCC @  Subjective:  No chief complaint on file.   History of Present Illness: Sonya Smith is a 62 y.o. female ***     Activities of Daily Living:  Patient reports morning stiffness for *** {minute/hour:19697}.   Patient {ACTIONS;DENIES/REPORTS:21021675::"Denies"} nocturnal pain.  Difficulty dressing/grooming: {ACTIONS;DENIES/REPORTS:21021675::"Denies"} Difficulty climbing stairs: {ACTIONS;DENIES/REPORTS:21021675::"Denies"} Difficulty getting out of chair: {ACTIONS;DENIES/REPORTS:21021675::"Denies"} Difficulty using hands for taps, buttons, cutlery, and/or writing: {ACTIONS;DENIES/REPORTS:21021675::"Denies"}  No Rheumatology ROS completed.   PMFS History:  Patient Active Problem List   Diagnosis Date Noted   Dental infection 08/17/2022   Obesity 08/17/2022   Pre-diabetes 08/17/2022   Acute upper back pain 10/01/2020   Venous stasis of both lower extremities 01/19/2020   Hypertension 04/06/2019   Pleuritic chest pain 04/06/2019   Healthcare maintenance 10/14/2017   Chronic use of opiate drug for therapeutic purpose 06/10/2016   Mixed connective tissue disease (HCC) 01/07/2016    Past Medical History:  Diagnosis Date   Anxiety 10/09/2010   Arthritis    of cervical and lumbar spine   Asthma    undiagnosed, was told by a physician in the ED, patient does not take any inhalers at home.    Chest pain    multiple ED visit, no objective evidence of cardiac or pulmonary causes  of chest pain in past   Hypertension 04/06/2019   Restless leg syndrome 10/14/2017    Family History  Problem Relation Age of Onset   Diabetes Mother    Stroke Maternal Aunt        32s   Cancer Maternal Uncle        recurrent   Hypertension Sister    Past Surgical History:   Procedure Laterality Date   drainage of peri tonsillar abscess     peri tonsillar abscess  10/11   drainage   Social History   Social History Narrative   Lives in [place], [place] with her boy friend who is disabled. 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 She has a daughter who is independent and provides her money for medicines. 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  No health insurance.    Immunization History  Administered Date(s) Administered   Tdap 10/14/2017     Objective: Vital Signs: LMP 07/07/2015    Physical Exam   Musculoskeletal Exam: ***  CDAI Exam: CDAI Score: -- Patient Global: --; Provider Global: -- Swollen: --; Tender: -- Joint Exam 02/15/2023   No joint exam has been documented for this visit   There is currently no information documented on the homunculus. Go to the Rheumatology activity and complete the homunculus joint exam.  Investigation: No additional findings.  Imaging: No results found.  Recent Labs: Lab Results  Component Value Date   WBC 9.0 09/02/2020   HGB 15.2 (H) 09/02/2020   PLT 229 09/02/2020   NA 141 10/23/2022   K 4.5 10/23/2022   CL 102 10/23/2022   CO2 24 10/23/2022   GLUCOSE 132 (H) 10/23/2022   BUN 15 10/23/2022   CREATININE 0.75 10/23/2022   BILITOT 0.3 12/23/2015   ALKPHOS  60 12/23/2015   AST 21 12/23/2015   ALT 21 12/23/2015   PROT 6.9 12/23/2015   ALBUMIN 4.3 12/23/2015   CALCIUM 9.5 10/23/2022   GFRAA 113 08/31/2019    Speciality Comments: No specialty comments available.  Procedures:  No procedures performed Allergies: Meloxicam, Codeine, and Darvocet [propoxyphene n-acetaminophen]   Assessment / Plan:     Visit Diagnoses: No diagnosis found.  Orders: No orders of the defined types were placed in this encounter.  No orders of the defined types were placed in this encounter.   Face-to-face time spent with patient was ***  minutes. Greater than 50% of time was spent in counseling and coordination of care.  Follow-Up Instructions: No follow-ups on file.   Fuller Plan, MD  Note - This record has been created using [employer].  Chart creation errors have been sought, but may not always  have been located. Such creation errors do not reflect on  the standard of medical care.

## 2023-02-22 ENCOUNTER — Other Ambulatory Visit: Payer: Self-pay

## 2023-02-22 ENCOUNTER — Other Ambulatory Visit (HOSPITAL_COMMUNITY): Payer: Self-pay

## 2023-02-22 DIAGNOSIS — M351 Other overlap syndromes: Secondary | ICD-10-CM

## 2023-02-22 DIAGNOSIS — Z79891 Long term (current) use of opiate analgesic: Secondary | ICD-10-CM

## 2023-02-22 MED ORDER — OXYCODONE-ACETAMINOPHEN 5-325 MG PO TABS
1.0000 | ORAL_TABLET | Freq: Two times a day (BID) | ORAL | 0 refills | Status: DC | PRN
Start: 2023-02-22 — End: 2023-03-22
  Filled 2023-02-22: qty 60, 30d supply, fill #0

## 2023-02-23 ENCOUNTER — Ambulatory Visit: Payer: Medicaid Other | Attending: Internal Medicine | Admitting: Internal Medicine

## 2023-02-23 ENCOUNTER — Ambulatory Visit (INDEPENDENT_AMBULATORY_CARE_PROVIDER_SITE_OTHER): Payer: Medicaid Other

## 2023-02-23 ENCOUNTER — Ambulatory Visit: Payer: Medicaid Other

## 2023-02-23 ENCOUNTER — Encounter: Payer: Self-pay | Admitting: Internal Medicine

## 2023-02-23 ENCOUNTER — Other Ambulatory Visit (HOSPITAL_COMMUNITY): Payer: Self-pay

## 2023-02-23 VITALS — BP 124/78 | HR 65 | Resp 16 | Ht 62.5 in | Wt 230.0 lb

## 2023-02-23 DIAGNOSIS — M549 Dorsalgia, unspecified: Secondary | ICD-10-CM

## 2023-02-23 DIAGNOSIS — G8929 Other chronic pain: Secondary | ICD-10-CM

## 2023-02-23 DIAGNOSIS — M79642 Pain in left hand: Secondary | ICD-10-CM | POA: Diagnosis not present

## 2023-02-23 DIAGNOSIS — I878 Other specified disorders of veins: Secondary | ICD-10-CM | POA: Diagnosis not present

## 2023-02-23 DIAGNOSIS — R0781 Pleurodynia: Secondary | ICD-10-CM

## 2023-02-23 DIAGNOSIS — M351 Other overlap syndromes: Secondary | ICD-10-CM | POA: Diagnosis not present

## 2023-02-23 DIAGNOSIS — M25561 Pain in right knee: Secondary | ICD-10-CM

## 2023-02-23 DIAGNOSIS — M79641 Pain in right hand: Secondary | ICD-10-CM | POA: Diagnosis not present

## 2023-02-23 DIAGNOSIS — M25562 Pain in left knee: Secondary | ICD-10-CM
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
## 2023-02-24 LAB — COMPLETE METABOLIC PANEL WITH GFR
AG Ratio: 1.7 (calc) (ref 1.0–2.5)
ALT: 17 U/L (ref 6–29)
AST: 13 U/L (ref 10–35)
Albumin: 4.5 g/dL (ref 3.6–5.1)
Alkaline phosphatase (APISO): 60 U/L (ref 37–153)
BUN: 20 mg/dL (ref 7–25)
CO2: 24 mmol/L (ref 20–32)
Calcium: 9.6 mg/dL (ref 8.6–10.4)
Chloride: 105 mmol/L (ref 98–110)
Creat: 0.72 mg/dL (ref 0.50–1.03)
Globulin: 2.7 g/dL (calc) (ref 1.9–3.7)
Glucose, Bld: 150 mg/dL — ABNORMAL HIGH (ref 65–99)
Potassium: 4.5 mmol/L (ref 3.5–5.3)
Sodium: 138 mmol/L (ref 135–146)
Total Bilirubin: 0.4 mg/dL (ref 0.2–1.2)
Total Protein: 7.2 g/dL (ref 6.1–8.1)
eGFR: 96 mL/min/{1.73_m2} (ref >=60)

## 2023-02-24 LAB — CBC WITH DIFFERENTIAL/PLATELET
Absolute Monocytes: 468 cells/uL (ref 200–950)
Basophils Absolute: 52 cells/uL (ref 0–200)
Basophils Relative: 0.5 %
Eosinophils Absolute: 187 cells/uL (ref 15–500)
Eosinophils Relative: 1.8 %
HCT: 45.3 % — ABNORMAL HIGH (ref 35.0–45.0)
Hemoglobin: 15.1 g/dL (ref 11.7–15.5)
Lymphs Abs: 2330 cells/uL (ref 850–3900)
MCH: 30 pg (ref 27.0–33.0)
MCHC: 33.3 g/dL (ref 32.0–36.0)
MCV: 89.9 fL (ref 80.0–100.0)
MPV: 13 fL — ABNORMAL HIGH (ref 7.5–12.5)
Monocytes Relative: 4.5 %
Neutro Abs: 7363 cells/uL (ref 1500–7800)
Neutrophils Relative %: 70.8 %
Platelets: 219 10*3/uL (ref 140–400)
RBC: 5.04 10*6/uL (ref 3.80–5.10)
RDW: 12.7 % (ref 11.0–15.0)
Total Lymphocyte: 22.4 %
WBC: 10.4 10*3/uL (ref 3.8–10.8)

## 2023-02-24 LAB — C3 AND C4
C3 Complement: 171 mg/dL (ref 83–193)
C4 Complement: 39 mg/dL (ref 15–57)

## 2023-02-24 LAB — RNP ANTIBODY: Ribonucleic Protein(ENA) Antibody, IgG: <1 AI

## 2023-02-24 LAB — SEDIMENTATION RATE: Sed Rate: 9 mm/h (ref 0–30)

## 2023-02-24 LAB — ANTI-DNA ANTIBODY, DOUBLE-STRANDED: ds DNA Ab: <1 IU/mL

## 2023-02-24 LAB — C-REACTIVE PROTEIN: CRP: 7.5 mg/L (ref <8.0)

## 2023-02-26 LAB — ANA: Anti Nuclear Antibody (ANA): NEGATIVE

## 2023-03-09 ENCOUNTER — Encounter: Payer: Self-pay | Admitting: Internal Medicine

## 2023-03-09 ENCOUNTER — Other Ambulatory Visit (HOSPITAL_COMMUNITY): Payer: Self-pay

## 2023-03-09 ENCOUNTER — Ambulatory Visit: Payer: Medicaid Other | Attending: Internal Medicine | Admitting: Internal Medicine

## 2023-03-09 VITALS — BP 122/79 | HR 54 | Resp 16 | Ht 62.5 in | Wt 228.0 lb

## 2023-03-09 DIAGNOSIS — M351 Other overlap syndromes: Secondary | ICD-10-CM | POA: Diagnosis not present

## 2023-03-09 DIAGNOSIS — M25562 Pain in left knee: Secondary | ICD-10-CM

## 2023-03-09 DIAGNOSIS — M25561 Pain in right knee: Secondary | ICD-10-CM

## 2023-03-09 DIAGNOSIS — G8929 Other chronic pain: Secondary | ICD-10-CM

## 2023-03-09 MED ORDER — CELECOXIB 200 MG PO CAPS
200.0000 mg | ORAL_CAPSULE | Freq: Two times a day (BID) | ORAL | 2 refills | Status: DC
Start: 2023-03-09 — End: 2023-05-18
  Filled 2023-03-09: qty 60, 30d supply, fill #0
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
## 2023-03-09 NOTE — Patient Instructions (Signed)

## 2023-03-22 ENCOUNTER — Other Ambulatory Visit: Payer: Self-pay | Admitting: Student

## 2023-03-22 ENCOUNTER — Other Ambulatory Visit: Payer: Self-pay

## 2023-03-22 ENCOUNTER — Other Ambulatory Visit (HOSPITAL_COMMUNITY): Payer: Self-pay

## 2023-03-22 DIAGNOSIS — M351 Other overlap syndromes: Secondary | ICD-10-CM

## 2023-03-22 DIAGNOSIS — Z79891 Long term (current) use of opiate analgesic: Secondary | ICD-10-CM

## 2023-03-23 ENCOUNTER — Other Ambulatory Visit (HOSPITAL_COMMUNITY): Payer: Self-pay

## 2023-03-23 MED ORDER — OXYCODONE-ACETAMINOPHEN 5-325 MG PO TABS
1.0000 | ORAL_TABLET | Freq: Two times a day (BID) | ORAL | 0 refills | Status: DC | PRN
Start: 2023-03-23 — End: 2023-04-22
  Filled 2023-03-23: qty 60, 30d supply, fill #0

## 2023-03-24 ENCOUNTER — Other Ambulatory Visit (HOSPITAL_COMMUNITY): Payer: Self-pay

## 2023-03-28 DIAGNOSIS — M25561 Pain in right knee: Secondary | ICD-10-CM | POA: Insufficient documentation

## 2023-04-22 ENCOUNTER — Other Ambulatory Visit: Payer: Self-pay

## 2023-04-22 ENCOUNTER — Telehealth: Payer: Self-pay

## 2023-04-22 DIAGNOSIS — Z79891 Long term (current) use of opiate analgesic: Secondary | ICD-10-CM

## 2023-04-22 DIAGNOSIS — M351 Other overlap syndromes: Secondary | ICD-10-CM

## 2023-04-22 MED ORDER — OXYCODONE-ACETAMINOPHEN 5-325 MG PO TABS
1.0000 | ORAL_TABLET | Freq: Two times a day (BID) | ORAL | 0 refills | Status: DC | PRN
  Filled 2023-04-22: qty 60, 30d supply, fill #0

## 2023-04-22 NOTE — Telephone Encounter (Signed)
Hip pain since feeling pop in her hip.  Hurts to sit.  Taking Oxycodone and ASA, Ibuprofen and heating Pd..  Still sore.  Feeling better.  Was asking about still going to her PT appointments next month.

## 2023-04-22 NOTE — Telephone Encounter (Signed)
Pt is requesting a call back ... She stated that she bent down the  wrong way and heard a pop now her ( RIGHT ) hip  is hurting her . She stated it has been  going on from the beginning I of this month... she does have and up coming appt with Physical therapy 12/2 and one with [place] 12/17 for her Over due 3 month f/u  that I just made

## 2023-04-23 ENCOUNTER — Other Ambulatory Visit: Payer: Self-pay

## 2023-04-23 ENCOUNTER — Other Ambulatory Visit (HOSPITAL_COMMUNITY): Payer: Self-pay

## 2023-04-26 ENCOUNTER — Other Ambulatory Visit (HOSPITAL_COMMUNITY): Payer: Self-pay

## 2023-04-27 ENCOUNTER — Other Ambulatory Visit (HOSPITAL_COMMUNITY): Payer: Self-pay

## 2023-05-03 ENCOUNTER — Encounter: Payer: Self-pay | Admitting: Physical Therapy

## 2023-05-03 ENCOUNTER — Ambulatory Visit: Payer: Medicaid Other | Attending: Internal Medicine | Admitting: Physical Therapy

## 2023-05-03 ENCOUNTER — Other Ambulatory Visit: Payer: Self-pay

## 2023-05-03 DIAGNOSIS — R262 Difficulty in walking, not elsewhere classified: Secondary | ICD-10-CM | POA: Diagnosis present

## 2023-05-03 DIAGNOSIS — M542 Cervicalgia: Secondary | ICD-10-CM | POA: Insufficient documentation

## 2023-05-03 DIAGNOSIS — M6281 Muscle weakness (generalized): Secondary | ICD-10-CM | POA: Insufficient documentation

## 2023-05-03 DIAGNOSIS — G8929 Other chronic pain: Secondary | ICD-10-CM | POA: Diagnosis present

## 2023-05-03 DIAGNOSIS — R2681 Unsteadiness on feet: Secondary | ICD-10-CM | POA: Diagnosis present

## 2023-05-03 DIAGNOSIS — M5459 Other low back pain: Secondary | ICD-10-CM | POA: Insufficient documentation

## 2023-05-03 DIAGNOSIS — M25561 Pain in right knee: Secondary | ICD-10-CM | POA: Insufficient documentation

## 2023-05-03 DIAGNOSIS — M25562 Pain in left knee: Secondary | ICD-10-CM | POA: Insufficient documentation

## 2023-05-03 NOTE — Therapy (Signed)
OUTPATIENT PHYSICAL THERAPY LOWER EXTREMITY EVALUATION   Patient Name: Sonya Smith MRN: [id number] DOB:Nov 27, 1960, 62 y.o., female Today's Date: 05/03/2023  END OF SESSION:  PT End of Session - 05/03/23 1423     Visit Number 1    Number of Visits 17    Date for PT Re-Evaluation 06/28/23    Authorization Type [employer] MCD    Authorization Time Period 05/03/23 to 07/11/22    PT Start Time 1321    PT Stop Time 1406    PT Time Calculation (min) 45 min    Activity Tolerance Patient tolerated treatment well    Behavior During Therapy [facility] for tasks assessed/performed             Past Medical History:  Diagnosis Date   Anxiety 10/09/2010   Arthritis    of cervical and lumbar spine   Asthma    undiagnosed, was told by a physician in the ED, patient does not take any inhalers at home.    Chest pain    multiple ED visit, no objective evidence of cardiac or pulmonary causes  of chest pain in past   Hypertension 04/06/2019   Mixed connective tissue disease (HCC)    Restless leg syndrome 10/14/2017   Past Surgical History:  Procedure Laterality Date   drainage of peri tonsillar abscess     peri tonsillar abscess  10/11   drainage   Patient Active Problem List   Diagnosis Date Noted   Bilateral knee pain 03/28/2023   Dental infection 08/17/2022   Obesity 08/17/2022   Pre-diabetes 08/17/2022   Chronic back pain 10/01/2020   Venous stasis of both lower extremities 01/19/2020   Hypertension 04/06/2019   Pleuritic chest pain 04/06/2019   Healthcare maintenance 10/14/2017   Chronic use of opiate drug for therapeutic purpose 06/10/2016   Mixed connective tissue disease (HCC) 01/07/2016    PCP: Morrie Sheldon MD   REFERRING PROVIDER: Fuller Plan, MD  REFERRING DIAG: Diagnosis M25.561,M25.562,G89.29 (ICD-10-CM) - Chronic pain of both knees  THERAPY DIAG:  Chronic pain of right knee  Chronic pain of left knee  Difficulty in walking, not elsewhere  classified  Unsteadiness on feet  Muscle weakness (generalized)  Rationale for Evaluation and Treatment: Rehabilitation  ONSET DATE: chronic   SUBJECTIVE:   SUBJECTIVE STATEMENT:  Rheumatologist just sent me here about a month ago, had to ice both knees down before I came because they were so swollen. Knee pain makes everything difficult. I have mixed connective tissue disease on top of this. No falls recently but if I didn't have the cane I might fall, only use the cane on days when I have to get a lot done. Some days are worse than others. Sometimes feels like knees will buckle out from under me. Have some trouble stepping in and out of shower.   PERTINENT HISTORY: See above  PAIN:  Are you having pain? Yes: NPRS scale: 5 (her average with pain medicine), can get to 15 at worst/10 Pain location: B knees, very symmetrical  Pain description: not burning, "just pain"  Aggravating factors: anything on her feet, laying down, hurts all the time  Relieving factors: pain medicine takes the edge off, nothing besides percocet and pain meds tend to calm it down   PRECAUTIONS: Fall  RED FLAGS: None   WEIGHT BEARING RESTRICTIONS: No  FALLS:  Has patient fallen in last 6 months? No; (+) FOF   LIVING ENVIRONMENT: Lives with:  lives with a  friend  who disabled  Lives in: Other trailer Stairs:  3 STE no rails but uses a dolly for support when using steps  Has following equipment at home: Single point cane  OCCUPATION: on SSI   PLOF: Independent, Independent with basic ADLs, Independent with homemaking with ambulation, Independent with gait, and Independent with transfers  PATIENT GOALS: control knee swelling and pain   NEXT MD VISIT: Referring 06/10/23  OBJECTIVE:  Note: Objective measures were completed at Evaluation unless otherwise noted.  DIAGNOSTIC FINDINGS:   X-ray right hand 2 views   Radiocarpal joint measures normal.  There is a 3 ossicle distal to the  ulnar styloid.   Moderately advanced degenerative changes of the first [facility]  joint with minimal subluxation.  MCP PIP and DIP joint spaces appear  well-preserved.  Very tiny lateral osteophyte or peritubular calcification  at second DIP.  No erosions seen.  Bone realization appears normal.   Impression   Moderately advanced first CMC joint osteoarthritis and mild DIP arthritis  X-ray left hand 2 views   Radiocarpal joint space appears normal.  Moderate degenerative change of  first CMC joint with subluxation and subchondral cyst.  MCP PIP and DIP  joint spaces appear normal.  No erosions or abnormal calcifications seen.   Bone realization peers normal.   Impression   Moderately severe first CMC joint osteoarthritis  X-ray right knee 3 views   There is severe medial compartment joint space narrowing and marginal  osteophytes present.  Lateral compartment space is better preserved but  also with marginal osteophytes.  Patellofemoral joint space appears  preserved there is lateral osteophytes and small superior patellar  enthesophyte.  Posterior calcifications present in the joint space.  No  visible joint effusion.   Impression   Severe medial compartment osteoarthritis less advanced in lateral and  patellofemoral space, posterior joint calcifications favor chronic  degenerative change related  X-ray left knee 3 views   There is near total loss of medial compartment joint space with large  marginal bone spurring on tibia.  Lateral compartment space overall  preserved but with marginal osteophytes.  Patellofemoral joint space okay  with marginal osteophytes and superior patellar enthesophyte.  Posterior  calcifications within the joint space.  No visible effusion.   Impression   Severe medial compartment osteoarthritis, left severe patellofemoral and  lateral compartment involvement, posterior calcifications favor advanced  degenerative change related       PATIENT SURVEYS:  KOOS  58%  COGNITION: Overall cognitive status: Within functional limits for tasks assessed     SENSATION: WFL  EDEMA:  Reports frequent edema in knees if she doesn't use ice       LOWER EXTREMITY ROM:  Active ROM Right eval Left eval  Hip flexion    Hip extension    Hip abduction    Hip adduction    Hip internal rotation    Hip external rotation    Knee flexion 90* 95*  Knee extension 4* 0*  Ankle dorsiflexion    Ankle plantarflexion    Ankle inversion    Ankle eversion     (Blank rows = not tested)  LOWER EXTREMITY MMT:  MMT Right eval Left eval  Hip flexion 4- 3+  Hip extension 3- 3-  Hip abduction 3- 4+  Hip adduction    Hip internal rotation    Hip external rotation    Knee flexion 4- 4  Knee extension 4+ 4-  Ankle dorsiflexion 5 5  Ankle plantarflexion  Ankle inversion    Ankle eversion     (Blank rows = not tested)    FUNCTIONAL TESTS:  Timed up and go (TUG): 18.1 seconds with SPC   GAIT: Distance walked: in clinic distances  Assistive device utilized: Single point cane Level of assistance: Modified independence Comments: varus knees, limited ankle DF, limited stance times, grossly antalgic and mildly unsteady    TODAY'S TREATMENT:                                                                                                                              DATE:   05/03/23- Eval, care planning, HEP discussion and practice, education on benefits of water PT and using therapy in the water to help build muscle strength/tolerance to land based activities  Seated clams yellow TB x10 Seated marches yellow TB x5 B Limited range SLRs x5 B from chair  Attempted LAQs yellow TB, painful/unable to tolerate     PATIENT EDUCATION:  Education details: exam findings, POC, HEP  Person educated: Patient Education method: Programmer, multimedia, Demonstration, and Handouts Education comprehension: verbalized understanding, returned demonstration, and needs further  education  HOME EXERCISE PROGRAM: Access Code: [email or web address] URL: [email or web address] Date: 05/03/2023 Prepared by: Nedra Hai  Exercises - Seated Hip Abduction with Resistance  - 1 x daily - 7 x weekly - 1 sets - 5-10 reps - 2 seconds  hold - Seated March with Resistance  - 1 x daily - 7 x weekly - 1 sets - 5 reps - 1 second  hold - Seated Small Alternating Straight Leg Lifts with Heel Touch  - 1 x daily - 7 x weekly - 1 sets - 5-10 reps  ASSESSMENT:  CLINICAL IMPRESSION: Patient is a 62 y.o. F who was seen today for physical therapy evaluation and treatment for Diagnosis M25.561,M25.562,G89.29 (ICD-10-CM) - Chronic pain of both knees. Exam with objective findings as above, her pain is quite chronic and she is taking multiple medications to manage it, never pain free and usually at 5/10 on NPRS. Activity tolerance was very limited due to high irritability of pain today- might really benefit from skilled PT in aquatic setting, she is open to trying this. Will make every attempt to improve pain levels and QOL/function.    OBJECTIVE IMPAIRMENTS: Abnormal gait, decreased activity tolerance, decreased balance, decreased knowledge of use of DME, decreased mobility, difficulty walking, decreased ROM, decreased strength, obesity, and pain.   ACTIVITY LIMITATIONS: standing, squatting, sleeping, stairs, transfers, bed mobility, bathing, locomotion level, and caring for others  PARTICIPATION LIMITATIONS: meal prep, cleaning, laundry, driving, shopping, community activity, and yard work  PERSONAL FACTORS: Age, Behavior pattern, Education, Fitness, Past/current experiences, Social background, and Time since onset of injury/illness/exacerbation are also affecting patient's functional outcome.   REHAB POTENTIAL: Fair chronicity of pain/impairments, potential difficulty attending >1 PT session a week  CLINICAL DECISION MAKING: Stable/uncomplicated  EVALUATION COMPLEXITY:  Low   GOALS: Goals reviewed with patient? No  SHORT TERM  GOALS: Target date: 05/31/2023   Will be compliant with appropriate progressive HEP  Baseline: Goal status: INITIAL  2.  Will be independent in appropriate edema management strategies including ice/elevation/use of compression garments  Baseline:  Goal status: INITIAL  3.  Will demonstrate safe stair navigation sequencing with LRAD  Baseline:  Goal status: INITIAL   LONG TERM GOALS: Target date: 06/28/2023    MMT to have improved by one grade in all weak groups  Baseline:  Goal status: INITIAL  2.  Pain to be no more than 3/10 on average in B knees  Baseline:  Goal status: INITIAL  3.  Will complete TUG in 14 seconds or less with LRAD to show improved mobility/reduced fall risk  Baseline:  Goal status: INITIAL  4.  KOOS score to improve by at least 10 points to show improved QOL/positive subjective improvement  Baseline:  Goal status: INITIAL    PLAN:  PT FREQUENCY: 1-2x/week  PT DURATION: 8 weeks  PLANNED INTERVENTIONS: 97164- PT Re-evaluation, 97110-Therapeutic exercises, 97530- Therapeutic activity, 97112- Neuromuscular re-education, 97535- Self Care, 81191- Manual therapy, [id number]- Gait training, [phone]- Orthotic Fit/training, [id number]- Aquatic Therapy, 97014- Electrical stimulation (unattended), 97016- Vasopneumatic device, [id number]- Ultrasound, [id number]- Ionotophoresis 4mg /ml Dexamethasone, Balance training, Stair training, Taping, Dry Needling, DME instructions, Cryotherapy, and Moist heat  PLAN FOR NEXT SESSION: caution as knees are very sensitive and highly irritable to pain- functional strengthening and flexibility work PRN, balance/gait/stair training PRN. Plan is to alternate water and land based therapies as her schedule and [facility] water therapist availability allow, will likely tolerate water PT much better   Nedra Hai, PT, DPT 05/03/23 2:25 PM   For all possible CPT codes, reference the Planned  Interventions line above.     Check all conditions that are expected to impact treatment: {Conditions expected to impact treatment:Morbid obesity, Musculoskeletal disorders, Social determinants of health, Active major medical illness, and Presence of Medical Equipment   If treatment provided at initial evaluation, no treatment charged due to lack of authorization.

## 2023-05-11 ENCOUNTER — Ambulatory Visit: Payer: Medicaid Other | Admitting: Physical Therapy

## 2023-05-11 DIAGNOSIS — G8929 Other chronic pain: Secondary | ICD-10-CM

## 2023-05-11 DIAGNOSIS — M25561 Pain in right knee: Secondary | ICD-10-CM | POA: Diagnosis not present

## 2023-05-11 DIAGNOSIS — M6281 Muscle weakness (generalized): Secondary | ICD-10-CM

## 2023-05-11 DIAGNOSIS — R2681 Unsteadiness on feet: Secondary | ICD-10-CM

## 2023-05-11 DIAGNOSIS — R262 Difficulty in walking, not elsewhere classified: Secondary | ICD-10-CM

## 2023-05-11 NOTE — Therapy (Signed)
OUTPATIENT PHYSICAL THERAPY LOWER EXTREMITY TREATMENT   Patient Name: Sonya Smith MRN: [id number] DOB:07/10/1960, 62 y.o., female Today's Date: 05/11/2023  END OF SESSION:  PT End of Session - 05/11/23 1409     Visit Number 2    Number of Visits 17    Date for PT Re-Evaluation 06/28/23    Authorization Type [employer] MCD    Authorization Time Period 05/03/23 to 07/11/22    PT Start Time 1409   pt arrived late   PT Stop Time 1445    PT Time Calculation (min) 36 min    Activity Tolerance Patient tolerated treatment well    Behavior During Therapy [facility] for tasks assessed/performed             Past Medical History:  Diagnosis Date   Anxiety 10/09/2010   Arthritis    of cervical and lumbar spine   Asthma    undiagnosed, was told by a physician in the ED, patient does not take any inhalers at home.    Chest pain    multiple ED visit, no objective evidence of cardiac or pulmonary causes  of chest pain in past   Hypertension 04/06/2019   Mixed connective tissue disease (HCC)    Restless leg syndrome 10/14/2017   Past Surgical History:  Procedure Laterality Date   drainage of peri tonsillar abscess     peri tonsillar abscess  10/11   drainage   Patient Active Problem List   Diagnosis Date Noted   Bilateral knee pain 03/28/2023   Dental infection 08/17/2022   Obesity 08/17/2022   Pre-diabetes 08/17/2022   Chronic back pain 10/01/2020   Venous stasis of both lower extremities 01/19/2020   Hypertension 04/06/2019   Pleuritic chest pain 04/06/2019   Healthcare maintenance 10/14/2017   Chronic use of opiate drug for therapeutic purpose 06/10/2016   Mixed connective tissue disease (HCC) 01/07/2016    PCP: Morrie Sheldon MD   REFERRING PROVIDER: Fuller Plan, MD  REFERRING DIAG: Diagnosis M25.561,M25.562,G89.29 (ICD-10-CM) - Chronic pain of both knees  THERAPY DIAG:  Chronic pain of right knee  Chronic pain of left knee  Difficulty in walking, not  elsewhere classified  Unsteadiness on feet  Muscle weakness (generalized)  Rationale for Evaluation and Treatment: Rehabilitation  ONSET DATE: chronic   SUBJECTIVE:   SUBJECTIVE STATEMENT: "Sonya Smith" or "Sonya Smith"  Pt reports 6/10 pain today in her knees, using SPC for ambulation. Pt reports that her knees swell but ice pack helps, took percocet before therapy and used ice packs before hand to help reduce pain.  Pt reports that she is not interested in knee surgery, may be open to injections (steroid or gel).  Not interested in aquatic therapy but does have a Jacuzzi at home.  PERTINENT HISTORY: See above  PAIN:  Are you having pain? Yes: NPRS scale: 6/10 Pain location: B knees, very symmetrical  Pain description: not burning, "just pain"  Aggravating factors: anything on her feet, laying down, hurts all the time  Relieving factors: pain medicine takes the edge off, nothing besides percocet and pain meds tend to calm it down, ice pack   PRECAUTIONS: Fall  RED FLAGS: None   WEIGHT BEARING RESTRICTIONS: No  FALLS:  Has patient fallen in last 6 months? No; (+) FOF   LIVING ENVIRONMENT: Lives with:  lives with a  friend who disabled  Lives in: Other trailer Stairs:  3 STE no rails but uses a dolly for support when using steps  Has following  equipment at home: Single point cane  OCCUPATION: on SSI   PLOF: Independent, Independent with basic ADLs, Independent with homemaking with ambulation, Independent with gait, and Independent with transfers  PATIENT GOALS: control knee swelling and pain   NEXT MD VISIT: Referring 06/10/23  OBJECTIVE:  Note: Objective measures were completed at Evaluation unless otherwise noted.  DIAGNOSTIC FINDINGS:   X-ray right hand 2 views   Radiocarpal joint measures normal.  There is a 3 ossicle distal to the  ulnar styloid.  Moderately advanced degenerative changes of the first [facility]  joint with minimal subluxation.  MCP PIP and DIP joint  spaces appear  well-preserved.  Very tiny lateral osteophyte or peritubular calcification  at second DIP.  No erosions seen.  Bone realization appears normal.   Impression   Moderately advanced first CMC joint osteoarthritis and mild DIP arthritis  X-ray left hand 2 views   Radiocarpal joint space appears normal.  Moderate degenerative change of  first CMC joint with subluxation and subchondral cyst.  MCP PIP and DIP  joint spaces appear normal.  No erosions or abnormal calcifications seen.   Bone realization peers normal.   Impression   Moderately severe first CMC joint osteoarthritis  X-ray right knee 3 views   There is severe medial compartment joint space narrowing and marginal  osteophytes present.  Lateral compartment space is better preserved but  also with marginal osteophytes.  Patellofemoral joint space appears  preserved there is lateral osteophytes and small superior patellar  enthesophyte.  Posterior calcifications present in the joint space.  No  visible joint effusion.   Impression   Severe medial compartment osteoarthritis less advanced in lateral and  patellofemoral space, posterior joint calcifications favor chronic  degenerative change related  X-ray left knee 3 views   There is near total loss of medial compartment joint space with large  marginal bone spurring on tibia.  Lateral compartment space overall  preserved but with marginal osteophytes.  Patellofemoral joint space okay  with marginal osteophytes and superior patellar enthesophyte.  Posterior  calcifications within the joint space.  No visible effusion.   Impression   Severe medial compartment osteoarthritis, left severe patellofemoral and  lateral compartment involvement, posterior calcifications favor advanced  degenerative change related       PATIENT SURVEYS:  KOOS 58%  COGNITION: Overall cognitive status: Within functional limits for tasks  assessed     SENSATION: WFL  EDEMA:  Reports frequent edema in knees if she doesn't use ice       LOWER EXTREMITY ROM:  Active ROM Right eval Left eval  Hip flexion    Hip extension    Hip abduction    Hip adduction    Hip internal rotation    Hip external rotation    Knee flexion 90* 95*  Knee extension 4* 0*  Ankle dorsiflexion    Ankle plantarflexion    Ankle inversion    Ankle eversion     (Blank rows = not tested)  LOWER EXTREMITY MMT:  MMT Right eval Left eval  Hip flexion 4- 3+  Hip extension 3- 3-  Hip abduction 3- 4+  Hip adduction    Hip internal rotation    Hip external rotation    Knee flexion 4- 4  Knee extension 4+ 4-  Ankle dorsiflexion 5 5  Ankle plantarflexion    Ankle inversion    Ankle eversion     (Blank rows = not tested)    FUNCTIONAL TESTS:  Timed up  and go (TUG): 18.1 seconds with SPC   GAIT: Distance walked: in clinic distances  Assistive device utilized: Single point cane Level of assistance: Modified independence Comments: varus knees, limited ankle DF, limited stance times, grossly antalgic and mildly unsteady    TODAY'S TREATMENT:                                                                                                                              TherEx SciFit level 2 for 5 minutes using BUE/BLEs for neural priming for reciprocal movement, dynamic cardiovascular warmup and increased amplitude of stepping. Pt with increase in knee pain with activity but is able to tolerate activity.  Supine gentle therex on mat table: Heel slides x 10 reps B SLR x 5 reps B Pain in knee joint of B knees and R hip SAQ x 10 reps B with 5 sec hold Pain in B knee joints Quad set x 10 reps with 5 sec hold   Added appropriate exercises to HEP, see bolded below    PATIENT EDUCATION:  Education details: continue HEP and added to HEP, trial Jacuzzi at 92 degrees Person educated: Patient Education method: Programmer, multimedia,  Facilities manager, and Handouts Education comprehension: verbalized understanding, returned demonstration, and needs further education  HOME EXERCISE PROGRAM: Access Code: [facility] URL: [email or web address] Date: 05/03/2023 Prepared by: Nedra Hai  Exercises - Seated March with Resistance  - 1 x daily - 7 x weekly - 1 sets - 5 reps - 1 second  hold - Seated Small Alternating Straight Leg Lifts with Heel Touch  - 1 x daily - 7 x weekly - 1 sets - 5-10 reps - Supine Heel Slide  - 1 x daily - 7 x weekly - 1-2 sets - 10 reps - Supine Quad Set  - 1 x daily - 7 x weekly - 1-2 sets - 10 reps - 5 sec hold  ASSESSMENT:  CLINICAL IMPRESSION: Emphasis of skilled PT session on discussing aquatic therapy and trialing various supine LE strengthening exercises to determine what would be appropriate to add to her HEP. Pt not interested in aquatic therapy at this time, does have access to a Jacuzzi at home. Pt with fair tolerance to SLR and SAQ due to increase in knee joint pain, able to tolerate quad sets. Pt continues to benefit from skilled therapy services to work on strengthening her BLE and for increased independence with management of pain symptoms. Continue POC.     OBJECTIVE IMPAIRMENTS: Abnormal gait, decreased activity tolerance, decreased balance, decreased knowledge of use of DME, decreased mobility, difficulty walking, decreased ROM, decreased strength, obesity, and pain.   ACTIVITY LIMITATIONS: standing, squatting, sleeping, stairs, transfers, bed mobility, bathing, locomotion level, and caring for others  PARTICIPATION LIMITATIONS: meal prep, cleaning, laundry, driving, shopping, community activity, and yard work  PERSONAL FACTORS: Age, Behavior pattern, Education, Fitness, Past/current experiences, Social background, and Time since onset of injury/illness/exacerbation are also affecting patient's functional outcome.   REHAB  POTENTIAL: Fair chronicity of pain/impairments,  potential difficulty attending >1 PT session a week  CLINICAL DECISION MAKING: Stable/uncomplicated  EVALUATION COMPLEXITY: Low   GOALS: Goals reviewed with patient? No  SHORT TERM GOALS: Target date: 05/31/2023   Will be compliant with appropriate progressive HEP  Baseline: Goal status: INITIAL  2.  Will be independent in appropriate edema management strategies including ice/elevation/use of compression garments  Baseline:  Goal status: INITIAL  3.  Will demonstrate safe stair navigation sequencing with LRAD  Baseline:  Goal status: INITIAL   LONG TERM GOALS: Target date: 06/28/2023    MMT to have improved by one grade in all weak groups  Baseline:  Goal status: INITIAL  2.  Pain to be no more than 3/10 on average in B knees  Baseline:  Goal status: INITIAL  3.  Will complete TUG in 14 seconds or less with LRAD to show improved mobility/reduced fall risk  Baseline:  Goal status: INITIAL  4.  KOOS score to improve by at least 10 points to show improved QOL/positive subjective improvement  Baseline:  Goal status: INITIAL    PLAN:  PT FREQUENCY: 1-2x/week  PT DURATION: 8 weeks  PLANNED INTERVENTIONS: 97164- PT Re-evaluation, 97110-Therapeutic exercises, 97530- Therapeutic activity, 97112- Neuromuscular re-education, 97535- Self Care, 82956- Manual therapy, [id number]- Gait training, [phone]- Orthotic Fit/training, [id number]- Aquatic Therapy, 97014- Electrical stimulation (unattended), 97016- Vasopneumatic device, [id number]- Ultrasound, [id number]- Ionotophoresis 4mg /ml Dexamethasone, Balance training, Stair training, Taping, Dry Needling, DME instructions, Cryotherapy, and Moist heat  PLAN FOR NEXT SESSION: caution as knees are very sensitive and highly irritable to pain- functional strengthening and flexibility work PRN, balance/gait/stair training PRN. Pt not interested in aquatic therapy right now.  Address hip adductor tightness, how is HEP, did she try Jacuzzi at  77*?   Peter Congo, PT Peter Congo, PT, DPT, CSRS  05/11/2023, 2:57 PM    For all possible CPT codes, reference the Planned Interventions line above.     Check all conditions that are expected to impact treatment: {Conditions expected to impact treatment:Morbid obesity, Musculoskeletal disorders, Social determinants of health, Active major medical illness, and Presence of Medical Equipment   If treatment provided at initial evaluation, no treatment charged due to lack of authorization.

## 2023-05-12 NOTE — Telephone Encounter (Signed)
RTC to patient message left that the Clinics had called and to return a call to the clinics.

## 2023-05-13 NOTE — Telephone Encounter (Signed)
Patient called she stated she missed a call from [facility], advised patient of previous message below patient stated she went to her pt appointment and that's she has another pt appointment on Monday. Patient stated she has been doing exercises at home. Patient understands and will wait to be seen on 12/17.

## 2023-05-17 ENCOUNTER — Ambulatory Visit: Payer: Medicaid Other | Admitting: Physical Therapy

## 2023-05-17 DIAGNOSIS — M25561 Pain in right knee: Secondary | ICD-10-CM | POA: Diagnosis not present

## 2023-05-17 DIAGNOSIS — M6281 Muscle weakness (generalized): Secondary | ICD-10-CM

## 2023-05-17 DIAGNOSIS — R262 Difficulty in walking, not elsewhere classified: Secondary | ICD-10-CM

## 2023-05-17 DIAGNOSIS — R2681 Unsteadiness on feet: Secondary | ICD-10-CM

## 2023-05-17 DIAGNOSIS — G8929 Other chronic pain: Secondary | ICD-10-CM

## 2023-05-17 NOTE — Therapy (Signed)
OUTPATIENT PHYSICAL THERAPY LOWER EXTREMITY TREATMENT   Patient Name: Sonya Smith MRN: [id number] DOB:01-Nov-1960, 62 y.o., female Today's Date: 05/17/2023  END OF SESSION:  PT End of Session - 05/17/23 1323     Visit Number 3    Number of Visits 17    Date for PT Re-Evaluation 06/28/23    Authorization Type [employer] MCD    Authorization Time Period 05/03/23 to 07/11/22    PT Start Time 1323   pt arrived late   PT Stop Time 1353    PT Time Calculation (min) 30 min    Activity Tolerance Patient limited by pain    Behavior During Therapy [facility] for tasks assessed/performed              Past Medical History:  Diagnosis Date   Anxiety 10/09/2010   Arthritis    of cervical and lumbar spine   Asthma    undiagnosed, was told by a physician in the ED, patient does not take any inhalers at home.    Chest pain    multiple ED visit, no objective evidence of cardiac or pulmonary causes  of chest pain in past   Hypertension 04/06/2019   Mixed connective tissue disease (HCC)    Restless leg syndrome 10/14/2017   Past Surgical History:  Procedure Laterality Date   drainage of peri tonsillar abscess     peri tonsillar abscess  10/11   drainage   Patient Active Problem List   Diagnosis Date Noted   Bilateral knee pain 03/28/2023   Dental infection 08/17/2022   Obesity 08/17/2022   Pre-diabetes 08/17/2022   Chronic back pain 10/01/2020   Venous stasis of both lower extremities 01/19/2020   Hypertension 04/06/2019   Pleuritic chest pain 04/06/2019   Healthcare maintenance 10/14/2017   Chronic use of opiate drug for therapeutic purpose 06/10/2016   Mixed connective tissue disease (HCC) 01/07/2016    PCP: Morrie Sheldon MD   REFERRING PROVIDER: Fuller Plan, MD  REFERRING DIAG: Diagnosis M25.561,M25.562,G89.29 (ICD-10-CM) - Chronic pain of both knees  THERAPY DIAG:  Chronic pain of right knee  Chronic pain of left knee  Difficulty in walking, not elsewhere  classified  Unsteadiness on feet  Muscle weakness (generalized)  Rationale for Evaluation and Treatment: Rehabilitation  ONSET DATE: chronic   SUBJECTIVE:   SUBJECTIVE STATEMENT: "Sonya Smith" or "Sonya Smith"  Pt reports her R quad is hurting today, 8/10 (after percocet), was having cramping all night and this morning. Pt RLE is more visibly swollen than LLE.  Pt also having neck pain, having trouble holding her head up at times. Pt sees her internal medicine doctor tomorrow, will ask about referral for neck and back pain as well.  PERTINENT HISTORY: See above  PAIN:  Are you having pain? Yes: NPRS scale: 8/10 Pain location: B knees, very symmetrical  Pain description: not burning, "just pain"  Aggravating factors: anything on her feet, laying down, hurts all the time  Relieving factors: pain medicine takes the edge off, nothing besides percocet and pain meds tend to calm it down, ice pack   PRECAUTIONS: Fall  RED FLAGS: None   WEIGHT BEARING RESTRICTIONS: No  FALLS:  Has patient fallen in last 6 months? No; (+) FOF   LIVING ENVIRONMENT: Lives with:  lives with a  friend who disabled  Lives in: Other trailer Stairs:  3 STE no rails but uses a dolly for support when using steps  Has following equipment at home: Single point cane  OCCUPATION:  on SSI   PLOF: Independent, Independent with basic ADLs, Independent with homemaking with ambulation, Independent with gait, and Independent with transfers  PATIENT GOALS: control knee swelling and pain   NEXT MD VISIT: Referring 06/10/23  OBJECTIVE:  Note: Objective measures were completed at Evaluation unless otherwise noted.  DIAGNOSTIC FINDINGS:   X-ray right hand 2 views   Radiocarpal joint measures normal.  There is a 3 ossicle distal to the  ulnar styloid.  Moderately advanced degenerative changes of the first [employer]  joint with minimal subluxation.  MCP PIP and DIP joint spaces appear  well-preserved.  Very tiny lateral  osteophyte or peritubular calcification  at second DIP.  No erosions seen.  Bone realization appears normal.   Impression   Moderately advanced first CMC joint osteoarthritis and mild DIP arthritis  X-ray left hand 2 views   Radiocarpal joint space appears normal.  Moderate degenerative change of  first CMC joint with subluxation and subchondral cyst.  MCP PIP and DIP  joint spaces appear normal.  No erosions or abnormal calcifications seen.   Bone realization peers normal.   Impression   Moderately severe first CMC joint osteoarthritis  X-ray right knee 3 views   There is severe medial compartment joint space narrowing and marginal  osteophytes present.  Lateral compartment space is better preserved but  also with marginal osteophytes.  Patellofemoral joint space appears  preserved there is lateral osteophytes and small superior patellar  enthesophyte.  Posterior calcifications present in the joint space.  No  visible joint effusion.   Impression   Severe medial compartment osteoarthritis less advanced in lateral and  patellofemoral space, posterior joint calcifications favor chronic  degenerative change related  X-ray left knee 3 views   There is near total loss of medial compartment joint space with large  marginal bone spurring on tibia.  Lateral compartment space overall  preserved but with marginal osteophytes.  Patellofemoral joint space okay  with marginal osteophytes and superior patellar enthesophyte.  Posterior  calcifications within the joint space.  No visible effusion.   Impression   Severe medial compartment osteoarthritis, left severe patellofemoral and  lateral compartment involvement, posterior calcifications favor advanced  degenerative change related       PATIENT SURVEYS:  KOOS 58%  COGNITION: Overall cognitive status: Within functional limits for tasks assessed     SENSATION: WFL  EDEMA:  Reports frequent edema in knees if she  doesn't use ice       LOWER EXTREMITY ROM:  Active ROM Right eval Left eval  Hip flexion    Hip extension    Hip abduction    Hip adduction    Hip internal rotation    Hip external rotation    Knee flexion 90* 95*  Knee extension 4* 0*  Ankle dorsiflexion    Ankle plantarflexion    Ankle inversion    Ankle eversion     (Blank rows = not tested)  LOWER EXTREMITY MMT:  MMT Right eval Left eval  Hip flexion 4- 3+  Hip extension 3- 3-  Hip abduction 3- 4+  Hip adduction    Hip internal rotation    Hip external rotation    Knee flexion 4- 4  Knee extension 4+ 4-  Ankle dorsiflexion 5 5  Ankle plantarflexion    Ankle inversion    Ankle eversion     (Blank rows = not tested)    FUNCTIONAL TESTS:  Timed up and go (TUG): 18.1 seconds with SPC  GAIT: Distance walked: in clinic distances  Assistive device utilized: Single point cane Level of assistance: Modified independence Comments: varus knees, limited ankle DF, limited stance times, grossly antalgic and mildly unsteady    TODAY'S TREATMENT:                                                                                                                              TherEx PROM R knee flexion/extension, increase in pain in R lateral hip and in R knee  Supine SKFO/hip adductor stretch on R side 2 x 30 sec each, decreased tolerance; 4 x 30 sec on L side with improved tolerance  Attempted AAROM supine hip abd, x 5 reps on RLE, x 2 reps on LLE but has severe pain along medial joint line of L knee  Seated LAQ X 5 reps RLE, avoided end-range/locking out knee X 10 reps LLE  Seated march-overs Alt L/R 2# dumbell march-overs 2 X 5 reps RLE, 2 x 10 reps LLE    PATIENT EDUCATION:  Education details: continue HEP as tolerated, trial Jacuzzi at 92 degrees, ask PCP about referral for neck and back pain; use of heat for pain management Person educated: Patient Education method: Explanation and  Demonstration Education comprehension: verbalized understanding, returned demonstration, and needs further education  HOME EXERCISE PROGRAM: Access Code: [email or web address] URL: [email or web address] Date: 05/03/2023 Prepared by: Nedra Hai  Exercises - Seated March with Resistance  - 1 x daily - 7 x weekly - 1 sets - 5 reps - 1 second  hold - Seated Small Alternating Straight Leg Lifts with Heel Touch  - 1 x daily - 7 x weekly - 1 sets - 5-10 reps - Supine Heel Slide  - 1 x daily - 7 x weekly - 1-2 sets - 10 reps - Supine Quad Set  - 1 x daily - 7 x weekly - 1-2 sets - 10 reps - 5 sec hold  ASSESSMENT:  CLINICAL IMPRESSION: Emphasis of skilled PT session on continuing to work on LE strengthening exercises to address chronic knee pain. Pt very limited by her knee pain this session with limited tolerance for various exercises. Deferred adding anything to her HEP due to increase in pain and decrease in tolerance this date. Pt continues to benefit from skilled therapy services to work towards increased independence with management of her pain symptoms. Continue POC.     OBJECTIVE IMPAIRMENTS: Abnormal gait, decreased activity tolerance, decreased balance, decreased knowledge of use of DME, decreased mobility, difficulty walking, decreased ROM, decreased strength, obesity, and pain.   ACTIVITY LIMITATIONS: standing, squatting, sleeping, stairs, transfers, bed mobility, bathing, locomotion level, and caring for others  PARTICIPATION LIMITATIONS: meal prep, cleaning, laundry, driving, shopping, community activity, and yard work  PERSONAL FACTORS: Age, Behavior pattern, Education, Fitness, Past/current experiences, Social background, and Time since onset of injury/illness/exacerbation are also affecting patient's functional outcome.   REHAB POTENTIAL: Fair chronicity of pain/impairments, potential difficulty attending >1 PT session a  week  CLINICAL DECISION MAKING:  Stable/uncomplicated  EVALUATION COMPLEXITY: Low   GOALS: Goals reviewed with patient? No  SHORT TERM GOALS: Target date: 05/31/2023   Will be compliant with appropriate progressive HEP  Baseline: Goal status: INITIAL  2.  Will be independent in appropriate edema management strategies including ice/elevation/use of compression garments  Baseline:  Goal status: INITIAL  3.  Will demonstrate safe stair navigation sequencing with LRAD  Baseline:  Goal status: INITIAL   LONG TERM GOALS: Target date: 06/28/2023    MMT to have improved by one grade in all weak groups  Baseline:  Goal status: INITIAL  2.  Pain to be no more than 3/10 on average in B knees  Baseline:  Goal status: INITIAL  3.  Will complete TUG in 14 seconds or less with LRAD to show improved mobility/reduced fall risk  Baseline:  Goal status: INITIAL  4.  KOOS score to improve by at least 10 points to show improved QOL/positive subjective improvement  Baseline:  Goal status: INITIAL    PLAN:  PT FREQUENCY: 1-2x/week  PT DURATION: 8 weeks  PLANNED INTERVENTIONS: 97164- PT Re-evaluation, 97110-Therapeutic exercises, 97530- Therapeutic activity, 97112- Neuromuscular re-education, 97535- Self Care, 87564- Manual therapy, [id number]- Gait training, [phone]- Orthotic Fit/training, [id number]- Aquatic Therapy, 97014- Electrical stimulation (unattended), 97016- Vasopneumatic device, [id number]- Ultrasound, [id number]- Ionotophoresis 4mg /ml Dexamethasone, Balance training, Stair training, Taping, Dry Needling, DME instructions, Cryotherapy, and Moist heat  PLAN FOR NEXT SESSION: caution as knees are very sensitive and highly irritable to pain- functional strengthening and flexibility work PRN, balance/gait/stair training PRN. Pt not interested in aquatic therapy right now.  Address hip adductor tightness (butterfly stretch in sitting?), how is HEP, did she try Jacuzzi at 29*?, did we get referral for neck and back pain?   Peter Congo, PT Peter Congo, PT, DPT, CSRS  05/17/2023, 1:56 PM    For all possible CPT codes, reference the Planned Interventions line above.     Check all conditions that are expected to impact treatment: {Conditions expected to impact treatment:Morbid obesity, Musculoskeletal disorders, Social determinants of health, Active major medical illness, and Presence of Medical Equipment   If treatment provided at initial evaluation, no treatment charged due to lack of authorization.

## 2023-05-18 ENCOUNTER — Other Ambulatory Visit: Payer: Self-pay

## 2023-05-18 ENCOUNTER — Other Ambulatory Visit (HOSPITAL_COMMUNITY): Payer: Self-pay

## 2023-05-18 ENCOUNTER — Ambulatory Visit (INDEPENDENT_AMBULATORY_CARE_PROVIDER_SITE_OTHER): Payer: Medicaid Other | Admitting: Student

## 2023-05-18 ENCOUNTER — Encounter: Payer: Self-pay | Admitting: Student

## 2023-05-18 VITALS — BP 123/74 | HR 77 | Temp 97.7°F | Ht 62.4 in | Wt 233.2 lb

## 2023-05-18 DIAGNOSIS — M549 Dorsalgia, unspecified: Secondary | ICD-10-CM

## 2023-05-18 DIAGNOSIS — M542 Cervicalgia: Secondary | ICD-10-CM | POA: Insufficient documentation

## 2023-05-18 DIAGNOSIS — M5489 Other dorsalgia: Secondary | ICD-10-CM

## 2023-05-18 DIAGNOSIS — G8929 Other chronic pain: Secondary | ICD-10-CM | POA: Diagnosis not present

## 2023-05-18 DIAGNOSIS — M351 Other overlap syndromes: Secondary | ICD-10-CM

## 2023-05-18 DIAGNOSIS — Z79891 Long term (current) use of opiate analgesic: Secondary | ICD-10-CM | POA: Diagnosis not present

## 2023-05-18 MED ORDER — OXYCODONE-ACETAMINOPHEN 5-325 MG PO TABS
1.0000 | ORAL_TABLET | Freq: Two times a day (BID) | ORAL | 0 refills | Status: DC | PRN
  Filled 2023-05-24: qty 60, 30d supply, fill #0
  Filled ????-??-??: fill #0

## 2023-05-18 MED ORDER — CELECOXIB 50 MG PO CAPS
50.0000 mg | ORAL_CAPSULE | Freq: Two times a day (BID) | ORAL | 1 refills | Status: AC
  Filled 2023-05-18: qty 60, 30d supply, fill #0

## 2023-05-18 MED ORDER — DULOXETINE HCL 30 MG PO CPEP
30.0000 mg | ORAL_CAPSULE | Freq: Every day | ORAL | 0 refills | Status: DC
  Filled 2023-05-18: qty 90, 90d supply, fill #0

## 2023-05-18 NOTE — Assessment & Plan Note (Addendum)
Patient reports having chronic back pain.  This was thought to be from mixed connective tissue disease.  But per rheumatology workup, unclear if this is related.  Patient likely has osteoarthritis.  She has been on chronic Percocet.  She notes that she went to physical therapy, and they state that they could potentially work on patient's back.  On exam, patient does not have any midline spinal tenderness, step-off, or deformity.  There is no paraspinal muscle tenderness.  Patient would likely benefit from physical therapy.  Will refer.  I do wonder patient has a component of fibromyalgia.  Plan: -Continue Percocet 5 mg twice daily -Refer patient to physical therapy -Start Cymbalta 30 mg daily 1 week after starting Celebrex -Follow-up in 3 months

## 2023-05-18 NOTE — Progress Notes (Signed)
CC: Neck pain, back pain  HPI:  Sonya Smith is a 62 y.o. female with a past medical history of hypertension, prediabetes, mixed connective tissue disease, chronic back pain who presents for follow-up appointment.  Please see assessment and plan for full HPI.   Past Medical History:  Diagnosis Date   Anxiety 10/09/2010   Arthritis    of cervical and lumbar spine   Asthma    undiagnosed, was told by a physician in the ED, patient does not take any inhalers at home.    Chest pain    multiple ED visit, no objective evidence of cardiac or pulmonary causes  of chest pain in past   Hypertension 04/06/2019   Mixed connective tissue disease (HCC)    Restless leg syndrome 10/14/2017     Current Outpatient Medications:    celecoxib (CELEBREX) 50 MG capsule, Take 1 capsule (50 mg total) by mouth 2 (two) times daily., Disp: 60 capsule, Rfl: 1   [START ON 05/25/2023] DULoxetine (CYMBALTA) 30 MG capsule, Take 1 capsule (30 mg total) by mouth daily., Disp: 90 capsule, Rfl: 0   amLODipine (NORVASC) 5 MG tablet, Take 1 tablet (5 mg total) by mouth daily., Disp: 30 tablet, Rfl: 11   aspirin EC 81 MG tablet, Take 81 mg by mouth daily as needed for mild pain., Disp: , Rfl:    ibuprofen (ADVIL) 200 MG tablet, Take 400 mg by mouth every 6 (six) hours as needed for moderate pain., Disp: , Rfl:    Multiple Vitamins-Minerals (MULTIVITAMIN WITH MINERALS) tablet, Take 1 tablet by mouth daily., Disp: , Rfl:    olmesartan (BENICAR) 5 MG tablet, Take 1 tablet (5 mg total) by mouth daily., Disp: 90 tablet, Rfl: 3   [START ON 05/23/2023] oxyCODONE-acetaminophen (PERCOCET/ROXICET) 5-325 MG tablet, Take 1 tablet by mouth 2 (two) times daily as needed for severe pain (pain score 7-10)., Disp: 60 tablet, Rfl: 0  Review of Systems:    MSK: Patient endorses back pain and neck pain.  Physical Exam:  Vitals:   05/18/23 1429  BP: 123/74  Pulse: 77  Temp: 97.7 F (36.5 C)  TempSrc: Oral  SpO2: 100%   Weight: 233 lb 3.2 oz (105.8 kg)  Height: 5' 2.4" (1.585 m)    General: Patient is sitting comfortably in the room  Neck: Supple, nontender, decreased range of motion active and passive secondary to pain, no midline cervical spine tenderness, no cervical paraspinal muscle tenderness Back: No midline tenderness, no step off or deformities noted. No paraspinal muscle tenderness.    Assessment & Plan:   Neck pain Patient reports 34-month history of neck pain.  She states is associated with back pain, and knee pain.  She states that it has become worse.  She states that the pain waxes and wanes, but never goes away.  She states she was with her physical therapist who stated that they could work with her if she was referred.  On exam, patient has decreased active and passive range of motion on sidebending.  Negative Spurling's test.  I do wonder with the patient's autoimmune workup being negative for patient does have a component of fibromyalgia.  If neck pain does not improve, could potentially get imaging.  Patient did have CT and 2020 which showed degenerative disc disease.    Plan: -Refer patient to physical therapy -Celebrex 50 mg twice daily  Chronic back pain Patient reports having chronic back pain.  This was thought to be from mixed connective tissue  disease.  But per rheumatology workup, unclear if this is related.  Patient likely has osteoarthritis.  She has been on chronic Percocet.  She notes that she went to physical therapy, and they state that they could potentially work on patient's back.  On exam, patient does not have any midline spinal tenderness, step-off, or deformity.  There is no paraspinal muscle tenderness.  Patient would likely benefit from physical therapy.  Will refer.  I do wonder patient has a component of fibromyalgia.  Plan: -Continue Percocet 5 mg twice daily -Refer patient to physical therapy -Start Cymbalta 30 mg daily 1 week after starting Celebrex -Follow-up  in 3 months  Patient seen with Dr. Mercie Eon   Modena Slater, DO PGY-2 Internal Medicine Resident  Pager: [phone]

## 2023-05-18 NOTE — Assessment & Plan Note (Signed)
Patient reports 78-month history of neck pain.  She states is associated with back pain, and knee pain.  She states that it has become worse.  She states that the pain waxes and wanes, but never goes away.  She states she was with her physical therapist who stated that they could work with her if she was referred.  On exam, patient has decreased active and passive range of motion on sidebending.  Negative Spurling's test.  I do wonder with the patient's autoimmune workup being negative for patient does have a component of fibromyalgia.  If neck pain does not improve, could potentially get imaging.  Patient did have CT and 2020 which showed degenerative disc disease.    Plan: -Refer patient to physical therapy -Celebrex 50 mg twice daily

## 2023-05-18 NOTE — Patient Instructions (Addendum)
Edmund Hilda you for allowing me to take part in your care today.  Here are your instructions.  1.  Regarding your neck pain and back pain, I want you to be referred to physical therapy.  I will put in notes for your physical therapist to work on your neck and back as well.  2.  I would like to prescribe you a course of Celebrex.  Please take Celebrex twice a day for the next month.  I have also started you on Cymbalta.  Please start taking this next week on May 25, 2023.  I want you to take the Celebrex for a week before starting the Cymbalta to see if the Celebrex helps you.  Afterwards you can start taking the Cymbalta and Celebrex together.  3.  Please return in about 3 months and at that time we can discuss your pain again, and potentially get your Pap smear done.  4.  I will refill your Percocet today.  Thank you, Dr. Allena Katz  If you have any other questions please contact the internal medicine clinic at [phone] If it is after hours, please call the [place] [facility] at [phone] and then ask the person who picks up for the resident on call.

## 2023-05-19 ENCOUNTER — Other Ambulatory Visit (HOSPITAL_COMMUNITY): Payer: Self-pay

## 2023-05-20 NOTE — Progress Notes (Signed)
Internal Medicine Clinic Attending  Case discussed with the resident at the time of the visit.  We reviewed the resident's history and exam and pertinent patient test results.  I agree with the assessment, diagnosis, and plan of care documented in the resident's note.  

## 2023-05-21 ENCOUNTER — Other Ambulatory Visit (HOSPITAL_COMMUNITY): Payer: Self-pay

## 2023-05-24 ENCOUNTER — Other Ambulatory Visit: Payer: Self-pay | Admitting: Student

## 2023-05-24 ENCOUNTER — Other Ambulatory Visit (HOSPITAL_COMMUNITY): Payer: Self-pay

## 2023-05-24 ENCOUNTER — Ambulatory Visit: Payer: Medicaid Other | Admitting: Physical Therapy

## 2023-05-27 ENCOUNTER — Other Ambulatory Visit: Payer: Self-pay

## 2023-05-27 ENCOUNTER — Other Ambulatory Visit (HOSPITAL_COMMUNITY): Payer: Self-pay
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
## 2023-05-28 ENCOUNTER — Other Ambulatory Visit (HOSPITAL_BASED_OUTPATIENT_CLINIC_OR_DEPARTMENT_OTHER): Payer: Self-pay

## 2023-05-28 ENCOUNTER — Other Ambulatory Visit (HOSPITAL_COMMUNITY): Payer: Self-pay

## 2023-05-28 MED ORDER — AMLODIPINE BESYLATE 5 MG PO TABS
5.0000 mg | ORAL_TABLET | Freq: Every day | ORAL | 11 refills | Status: DC
  Filled 2023-05-28: qty 30, 30d supply, fill #0
  Filled 2023-06-22: qty 30, 30d supply, fill #1
  Filled 2023-07-26: qty 30, 30d supply, fill #2
  Filled 2023-08-23: qty 30, 30d supply, fill #3
  Filled 2023-09-23: qty 30, 30d supply, fill #4
  Filled 2023-10-22: qty 30, 30d supply, fill #5
  Filled 2023-11-22: qty 30, 30d supply, fill #6

## 2023-05-31 ENCOUNTER — Ambulatory Visit: Payer: Medicaid Other | Admitting: Physical Therapy

## 2023-05-31 ENCOUNTER — Telehealth: Payer: Self-pay

## 2023-05-31 VITALS — BP 141/86 | HR 62

## 2023-05-31 DIAGNOSIS — G8929 Other chronic pain: Secondary | ICD-10-CM

## 2023-05-31 DIAGNOSIS — M6281 Muscle weakness (generalized): Secondary | ICD-10-CM

## 2023-05-31 DIAGNOSIS — M25561 Pain in right knee: Secondary | ICD-10-CM | POA: Diagnosis not present

## 2023-05-31 DIAGNOSIS — R262 Difficulty in walking, not elsewhere classified: Secondary | ICD-10-CM

## 2023-05-31 DIAGNOSIS — M5459 Other low back pain: Secondary | ICD-10-CM

## 2023-05-31 DIAGNOSIS — R2681 Unsteadiness on feet: Secondary | ICD-10-CM

## 2023-05-31 DIAGNOSIS — M542 Cervicalgia: Secondary | ICD-10-CM

## 2023-05-31 NOTE — Telephone Encounter (Signed)
RTC to patient states when she took the Cymbalta it gave her a headache for 3 days, Diarrhea, Nausea and made her feel weird.  Patient also c/o altered mind.  Stopped taking the Friday before Christmas.  Just wanted to let the doctor know that she is not going to take the medication.

## 2023-05-31 NOTE — Telephone Encounter (Signed)
Pt states cymbalta is causing her to feel sick, Causing her to have headache. Pt states she has stopped taking this medication. Please call back.

## 2023-06-07 ENCOUNTER — Ambulatory Visit: Payer: Medicaid Other | Attending: Internal Medicine | Admitting: Physical Therapy

## 2023-06-07 DIAGNOSIS — M6281 Muscle weakness (generalized): Secondary | ICD-10-CM | POA: Diagnosis present

## 2023-06-07 DIAGNOSIS — R2681 Unsteadiness on feet: Secondary | ICD-10-CM | POA: Diagnosis present

## 2023-06-07 DIAGNOSIS — M25562 Pain in left knee: Secondary | ICD-10-CM | POA: Insufficient documentation

## 2023-06-07 DIAGNOSIS — G8929 Other chronic pain: Secondary | ICD-10-CM | POA: Diagnosis present

## 2023-06-07 DIAGNOSIS — M5459 Other low back pain: Secondary | ICD-10-CM | POA: Diagnosis present

## 2023-06-07 DIAGNOSIS — M25561 Pain in right knee: Secondary | ICD-10-CM | POA: Insufficient documentation

## 2023-06-07 DIAGNOSIS — M542 Cervicalgia: Secondary | ICD-10-CM | POA: Insufficient documentation

## 2023-06-07 DIAGNOSIS — R262 Difficulty in walking, not elsewhere classified: Secondary | ICD-10-CM | POA: Insufficient documentation

## 2023-06-07 NOTE — Therapy (Signed)
 OUTPATIENT PHYSICAL THERAPY THORACOLUMBAR + CERVICAL + LOWER EXTREMITY TREATMENT   Patient Name: Sonya Smith MRN: [id number] DOB:1961/02/27, 63 y.o., female Today's Date: 06/07/2023  END OF SESSION:  PT End of Session - 06/07/23 1406     Visit Number 5    Number of Visits 17    Date for PT Re-Evaluation 07/11/22   recert/eval   Authorization Type [employer] MCD    Authorization Time Period 05/03/23 to 07/11/22    Progress Note Due on Visit 10    PT Start Time 1405   pt arrived late   PT Stop Time 1445    PT Time Calculation (min) 40 min    Activity Tolerance Patient limited by pain    Behavior During Therapy [facility] for tasks assessed/performed              Past Medical History:  Diagnosis Date   Anxiety 10/09/2010   Arthritis    of cervical and lumbar spine   Asthma    undiagnosed, was told by a physician in the ED, patient does not take any inhalers at home.    Chest pain    multiple ED visit, no objective evidence of cardiac or pulmonary causes  of chest pain in past   Hypertension 04/06/2019   Mixed connective tissue disease (HCC)    Restless leg syndrome 10/14/2017   Past Surgical History:  Procedure Laterality Date   drainage of peri tonsillar abscess     peri tonsillar abscess  10/11   drainage   Patient Active Problem List   Diagnosis Date Noted   Neck pain 05/18/2023   Bilateral knee pain 03/28/2023   Dental infection 08/17/2022   Obesity 08/17/2022   Pre-diabetes 08/17/2022   Chronic back pain 10/01/2020   Venous stasis of both lower extremities 01/19/2020   Hypertension 04/06/2019   Pleuritic chest pain 04/06/2019   Healthcare maintenance 10/14/2017   Chronic use of opiate drug for therapeutic purpose 06/10/2016   Mixed connective tissue disease (HCC) 01/07/2016    PCP: Stephanie Freund, MD  REFERRING PROVIDER: Lovie Clarity, MD  REFERRING DIAG: M54.89,G89.29 (ICD-10-CM) - Other chronic back pain  Rationale for Evaluation and Treatment:  Rehabilitation  THERAPY DIAG:  Chronic pain of right knee  Chronic pain of left knee  Difficulty in walking, not elsewhere classified  Unsteadiness on feet  Muscle weakness (generalized)  Other low back pain  Cervicalgia  ONSET DATE: 05/18/2023  SUBJECTIVE:  SUBJECTIVE STATEMENT:  Sonya Smith or Sonya Smith/Sonya Smith  Pt reports she is doing okay today, felt okay after last session.  Pt took some pain medicine about 20 min ago, the weather today is making her pain worse today (rainy, cold).  Pt has been doing her neck exercises (they have been helpful), has been doing her knee exercises but maybe not as much due to pain.   PERTINENT HISTORY:  past medical history of hypertension, prediabetes, mixed connective tissue disease, chronic back pain  PAIN:  Are you having pain? Yes: NPRS scale: not rated Pain location: back of neck and up back of her head Pain description: chronic, continuous ache Aggravating factors: turning head side to side Relieving factors: heating pad   Are you having pain? Yes: NPRS scale: not rated Pain location: mid-low back Pain description: achy Aggravating factors: trying to exercise, walk, do stuff Relieving factors: heating pad  PRECAUTIONS: Fall  RED FLAGS: None   WEIGHT BEARING RESTRICTIONS: No  FALLS:  Has patient fallen in last 6 months? No  LIVING ENVIRONMENT: Lives with:  lives with a  friend who disabled  Lives in: Other trailer Stairs:  3 STE no rails but uses a dolly for support when using steps  Has following equipment at home: Single point cane  OCCUPATION: on [employer]  PLOF: Independent with gait, Independent with transfers, and Requires assistive device for independence  PATIENT GOALS: to decrease pain  NEXT MD VISIT: not sure  OBJECTIVE:   Note: Objective measures were completed at Evaluation unless otherwise noted.  DIAGNOSTIC FINDINGS:  No recent imaging regarding back or neck  PATIENT SURVEYS:  Modified Oswestry 21/50 (moderate disability)  NDI 28/50 (severe disability)  COGNITION: Overall cognitive status: Within functional limits for tasks assessed     POSTURE: rounded shoulders, forward head, and posterior pelvic tilt  PALPATION: TTP in L cervical paraspinals, suboccipitals, levator, and UT; R side is mildly tender as compared to L side No TTP in central lumbar area but that is where she experiences pain  LUMBAR ROM:   AROM eval  Flexion WFL  Extension Limited to 25%, pain in mid-back  Right lateral flexion 50%  Left lateral flexion 50%  Right rotation 75%  Left rotation 75%   (Blank rows = not tested)   CERVICAL ROM:   Active ROM A/PROM (deg) eval  Flexion 12* (limited by pain)  Extension 9* (limited by pain)  Right lateral flexion 20* (limited by pain)  Left lateral flexion 20* (limited by pain)  Right rotation 27* (limited by pain)  Left rotation 30* (limited by pain)   (Blank rows = not tested)     TREATMENT:  TherEx To address chronic low back pain: Supine posterior pelvic tilt x 10 reps Max verbal and tactile cues for correct performance as pt is having onset of neck pain due to tensing up her whole body Supine bridges x 10 reps Has onset of L shin pain Supine marches with TA contract x 10 reps Supine LTR x 10 reps Initially feels a stretch in L side of her neck, decreases after a few reps Supine SKTC x 2 reps B x 30 sec each Feels pain in B quads with this stretch (more pain in R as compared to L) Supine modified Thomas stretch 3 x 30 sec each B Some pain in back on L side with L-sided stretch, improved with RLE placed in hooklying position Seated anterior leans on blue Swiss ball 5 x 30 sec each  Added appropriate exercises  to HEP, see bolded below for back  exercises   PATIENT EDUCATION:  Education details: continue knee and neck HEP, initiated LBP HEP Person educated: Patient Education method: Explanation, Demonstration, Tactile cues, Verbal cues, and Handouts Education comprehension: verbalized understanding, returned demonstration, and needs further education  HOME EXERCISE PROGRAM: Access Code: [email or web address] ([email or web address]) URL: [email or web address] Date: 05/03/2023 Prepared by: Josette Rough   Exercises - Seated March with Resistance  - 1 x daily - 7 x weekly - 1 sets - 5 reps - 1 second  hold - Seated Small Alternating Straight Leg Lifts with Heel Touch  - 1 x daily - 7 x weekly - 1 sets - 5-10 reps - Supine Heel Slide  - 1 x daily - 7 x weekly - 1-2 sets - 10 reps - Supine Quad Set  - 1 x daily - 7 x weekly - 1-2 sets - 10 reps - 5 sec hold   Access Code: [email or web address] (neck) URL: [email or web address] Date: 05/31/2023 Prepared by: Waddell Southgate  Exercises - Seated Cervical Flexion AROM  - 1 x daily - 7 x weekly - 1 sets - 5-7 reps - 10-15 sec hold - Gentle Levator Scapulae Stretch  - 1 x daily - 7 x weekly - 1 sets - 5-7 reps - 10-15sec hold - Seated Gentle Upper Trapezius Stretch  - 1 x daily - 7 x weekly - 1 sets - 5-7 reps - 10-15 sec hold   Access Code: [email or web address] (back) URL: [email or web address] Date: 06/07/2023 Prepared by: Waddell Southgate  Exercises - Supine Posterior Pelvic Tilt  - 1 x daily - 7 x weekly - 1 sets - 10 reps - 5 sec hold - Supine March  - 1 x daily - 7 x weekly - 3 sets - 10 reps - Supine Lower Trunk Rotation  - 1 x daily - 7 x weekly - 2-3 sets - 10 reps - Modified Thomas Stretch  - 1 x daily - 7 x weekly - 1 sets - 3 reps - 30 sec hold  ASSESSMENT:  CLINICAL IMPRESSION: Emphasis of skilled PT session on trialing various exercises to address chronic low back pain. Pt with strange onset of pain in her L shin region during bridges and with increased pain in her B quads  with SKTC stretch so deferred further repetitions of these exercises. Pt initially required max cuing to perform PPT correctly with increased tension throughout her whole body leading to increased neck pain, does improve with increased time and cueing. Pt can continue to benefit from skilled PT services to work towards increased independence with management of her pain symptoms. Continue POC.   OBJECTIVE IMPAIRMENTS: Abnormal gait, decreased activity tolerance, decreased mobility, difficulty walking, decreased ROM, decreased strength, impaired perceived functional ability, improper body mechanics, postural dysfunction, and pain.   ACTIVITY LIMITATIONS: carrying, lifting, bending, sitting, standing, squatting, stairs, and transfers  PARTICIPATION LIMITATIONS: meal prep, cleaning, laundry, driving, and community activity  PERSONAL FACTORS: Sex, Time since onset of injury/illness/exacerbation, and 1-2 comorbidities:    hypertension, prediabetes, mixed connective tissue disease, chronic back painare also affecting patient's functional outcome.   REHAB POTENTIAL: Fair time since onset, chronicity of pain, mixed connective tissue disorder  CLINICAL DECISION MAKING: Stable/uncomplicated  EVALUATION COMPLEXITY: Low   GOALS: Goals reviewed with patient? Yes   SHORT TERM GOALS: Target date: 05/31/2023   Will be compliant with appropriate progressive HEP  Baseline: Goal status: IN PROGRESS   2.  Will be independent in appropriate edema management strategies including ice/elevation/use of  compression garments  Baseline:  Goal status: NOT MET - goal no longer applicable   3.  Will demonstrate safe stair navigation sequencing with LRAD  Baseline:  Goal status: NOT MET - not assessed due to pain levels     LONG TERM GOALS: Target date: 06/28/2023     MMT to have improved by one grade in all weak groups  Baseline:  Goal status: NOT MET - not assessed due to time constraints   2.  Pain to  be no more than 3/10 on average in B knees  Baseline:  Goal status: NOT MET - pain continues to be 8/10 or greater   3.  Will complete TUG in 14 seconds or less with LRAD to show improved mobility/reduced fall risk  Baseline:  Goal status: NOT MET - not assessed due to time constraints   4.  KOOS score to improve by at least 10 points to show improved QOL/positive subjective improvement  Baseline:  Goal status: NOT MET - not assessed due to time constraints  NEW SHORT TERM GOALS: Target date: 06/22/2023   Pt will be independent with initial HEP for improved strength, ROM, transfers and gait and management of her pain symptoms. Baseline: Goal status: INITIAL   NEW LONG TERM GOALS: Target date: 07/13/2023   Pt will be independent with final HEP for improved strength, ROM, transfers and gait and management of her pain symptoms. Baseline:  Goal status: INITIAL  2.  Pt will improve her score on the Oswestry to </= 16/50 to demonstrate decreased disability level. Baseline: 21/50 (12/30) Goal status: INITIAL  3.  Pt will improve her score on the NDI to </= 23/50 to demonstrate decreased disability level. Baseline: 28/50 (12/30) Goal status: INITIAL  4.  KOOS score to improve by at least 10 points to show improved QOL/positive subjective improvement  Baseline: not assessed at eval? Goal status: IN PROGRESS   PLAN:  PT FREQUENCY: 1-2x/week (pending patient's availability)  PT DURATION: 6 weeks  PLANNED INTERVENTIONS: 97164- PT Re-evaluation, 97110-Therapeutic exercises, 97530- Therapeutic activity, 97112- Neuromuscular re-education, 97535- Self Care, [id number]- Manual therapy, [id number]- Gait training, [phone]- Aquatic Therapy, [phone]- Electrical stimulation (manual), Patient/Family education, Balance training, Stair training, Taping, Dry Needling, Joint mobilization, Spinal mobilization, DME instructions, Cryotherapy, and Moist heat.  PLAN FOR NEXT SESSION: assess KOOS and write LTG, how  is HEP? continue to alternate treating neck pain, back pain, and B knee pain; interested in DN-provided handout 1/6?, gastroc stretches, hip add stretch (butterfly position or SKFO), SciFit, next session focus on neck again - cervical traction, chin tucks, cervical retraction, subocc release, UT?   Waddell Southgate, PT Waddell Southgate, PT, DPT, CSRS  06/07/2023, 2:47 PM    For all possible CPT codes, reference the Planned Interventions line above.     Check all conditions that are expected to impact treatment: {Conditions expected to impact treatment:Presence of Medical Equipment   If treatment provided at initial evaluation, no treatment charged due to lack of authorization.

## 2023-06-10 ENCOUNTER — Ambulatory Visit: Payer: Medicaid Other | Admitting: Internal Medicine

## 2023-06-10 DIAGNOSIS — M351 Other overlap syndromes: Secondary | ICD-10-CM

## 2023-06-10 DIAGNOSIS — G8929 Other chronic pain: Secondary | ICD-10-CM

## 2023-06-14 ENCOUNTER — Ambulatory Visit: Payer: Medicaid Other | Admitting: Physical Therapy

## 2023-06-14 DIAGNOSIS — M25561 Pain in right knee: Secondary | ICD-10-CM | POA: Diagnosis not present

## 2023-06-14 DIAGNOSIS — R262 Difficulty in walking, not elsewhere classified: Secondary | ICD-10-CM

## 2023-06-14 DIAGNOSIS — R2681 Unsteadiness on feet: Secondary | ICD-10-CM

## 2023-06-14 DIAGNOSIS — G8929 Other chronic pain: Secondary | ICD-10-CM

## 2023-06-14 DIAGNOSIS — M5459 Other low back pain: Secondary | ICD-10-CM

## 2023-06-14 DIAGNOSIS — M542 Cervicalgia: Secondary | ICD-10-CM

## 2023-06-14 DIAGNOSIS — M6281 Muscle weakness (generalized): Secondary | ICD-10-CM

## 2023-06-14 NOTE — Therapy (Signed)
 OUTPATIENT PHYSICAL THERAPY THORACOLUMBAR + CERVICAL + LOWER EXTREMITY TREATMENT   Patient Name: Sonya Smith MRN: [id number] DOB:04-15-1961, 63 y.o., female Today's Date: 06/14/2023  END OF SESSION:  PT End of Session - 06/14/23 1326     Visit Number 6    Number of Visits 17    Date for PT Re-Evaluation 07/11/22   recert/eval   Authorization Type [employer] MCD    Authorization Time Period 05/03/23 to 07/11/22    Progress Note Due on Visit 10    PT Start Time 1326   pt arrived late   PT Stop Time 1400    PT Time Calculation (min) 34 min    Activity Tolerance Patient limited by pain    Behavior During Therapy [facility] for tasks assessed/performed               Past Medical History:  Diagnosis Date   Anxiety 10/09/2010   Arthritis    of cervical and lumbar spine   Asthma    undiagnosed, was told by a physician in the ED, patient does not take any inhalers at home.    Chest pain    multiple ED visit, no objective evidence of cardiac or pulmonary causes  of chest pain in past   Hypertension 04/06/2019   Mixed connective tissue disease (HCC)    Restless leg syndrome 10/14/2017   Past Surgical History:  Procedure Laterality Date   drainage of peri tonsillar abscess     peri tonsillar abscess  10/11   drainage   Patient Active Problem List   Diagnosis Date Noted   Neck pain 05/18/2023   Bilateral knee pain 03/28/2023   Dental infection 08/17/2022   Obesity 08/17/2022   Pre-diabetes 08/17/2022   Chronic back pain 10/01/2020   Venous stasis of both lower extremities 01/19/2020   Hypertension 04/06/2019   Pleuritic chest pain 04/06/2019   Healthcare maintenance 10/14/2017   Chronic use of opiate drug for therapeutic purpose 06/10/2016   Mixed connective tissue disease (HCC) 01/07/2016    PCP: Stephanie Freund, MD  REFERRING PROVIDER: Lovie Clarity, MD  REFERRING DIAG: M54.89,G89.29 (ICD-10-CM) - Other chronic back pain  Rationale for Evaluation and Treatment:  Rehabilitation  THERAPY DIAG:  Chronic pain of right knee  Chronic pain of left knee  Difficulty in walking, not elsewhere classified  Unsteadiness on feet  Muscle weakness (generalized)  Other low back pain  Cervicalgia  ONSET DATE: 05/18/2023  SUBJECTIVE:  SUBJECTIVE STATEMENT:  Thula or [name]  Pt denies any acute changes since last visit, no falls. Pt was able to shovel her walkway at home over the weekend very slowly. Pain ratings today: neck 8/10, back 4/10, knees 8/10.   PERTINENT HISTORY:  past medical history of hypertension, prediabetes, mixed connective tissue disease, chronic back pain  PAIN:  Are you having pain? Yes: NPRS scale: 8 Pain location: back of neck and up back of her head Pain description: chronic, continuous ache Aggravating factors: turning head side to side Relieving factors: heating pad   Are you having pain? Yes: NPRS scale: 4/10 Pain location: mid-low back Pain description: achy Aggravating factors: trying to exercise, walk, do stuff Relieving factors: heating pad  Are you having pain? Yes: NPRS scale: 8/10 Pain location: B knees  PRECAUTIONS: Fall  RED FLAGS: None   WEIGHT BEARING RESTRICTIONS: No  FALLS:  Has patient fallen in last 6 months? No  LIVING ENVIRONMENT: Lives with:  lives with a  friend who disabled  Lives in: Other trailer Stairs:  3 STE no rails but uses a dolly for support when using steps  Has following equipment at home: Single point cane  OCCUPATION: on SSI  PLOF: Independent with gait, Independent with transfers, and Requires assistive device for independence  PATIENT GOALS: to decrease pain  NEXT MD VISIT: not sure  OBJECTIVE:  Note: Objective measures were completed at Evaluation unless otherwise  noted.  DIAGNOSTIC FINDINGS:  No recent imaging regarding back or neck  PATIENT SURVEYS:  Modified Oswestry 21/50 (moderate disability)  NDI 28/50 (severe disability)  COGNITION: Overall cognitive status: Within functional limits for tasks assessed     POSTURE: rounded shoulders, forward head, and posterior pelvic tilt  PALPATION: TTP in L cervical paraspinals, suboccipitals, levator, and UT; R side is mildly tender as compared to L side No TTP in central lumbar area but that is where she experiences pain  LUMBAR ROM:   AROM eval  Flexion WFL  Extension Limited to 25%, pain in mid-back  Right lateral flexion 50%  Left lateral flexion 50%  Right rotation 75%  Left rotation 75%   (Blank rows = not tested)   CERVICAL ROM:   Active ROM A/PROM (deg) eval  Flexion 12* (limited by pain)  Extension 9* (limited by pain)  Right lateral flexion 20* (limited by pain)  Left lateral flexion 20* (limited by pain)  Right rotation 27* (limited by pain)  Left rotation 30* (limited by pain)   (Blank rows = not tested)     TREATMENT:  TherEx To address increase in neck pain this date: Supine lateral cervical flexion 3 x 30 sec each B Supine cervical rotation 3 x 15 sec each B Supine cervical retraction x 10 reps with max tactile cues for correct performance   Manual Therapy Suboccipital release 5 x 30 sec each Cervical distraction 5 x 30 sec each  TherAct KOOS: 21% of normal function (79% disability)   PATIENT EDUCATION:  Education details: continue neck, back, and knee HEP; added to neck HEP Person educated: Patient Education method: Explanation, Demonstration, Tactile cues, Verbal cues, and Handouts Education comprehension: verbalized understanding, returned demonstration, and needs further education  HOME EXERCISE PROGRAM: Access Code: [email or web address] ([email or web address]) URL: [email or web address] Date: 05/03/2023 Prepared by: Josette Rough   Exercises - Seated  March with Resistance  - 1 x daily - 7 x weekly - 1 sets - 5 reps - 1 second  hold - Seated Small Alternating Straight Leg Lifts  with Heel Touch  - 1 x daily - 7 x weekly - 1 sets - 5-10 reps - Supine Heel Slide  - 1 x daily - 7 x weekly - 1-2 sets - 10 reps - Supine Quad Set  - 1 x daily - 7 x weekly - 1-2 sets - 10 reps - 5 sec hold   Access Code: [email or web address] (neck) URL: [email or web address] Date: 05/31/2023 Prepared by: Waddell Southgate  Exercises - Seated Cervical Flexion AROM  - 1 x daily - 7 x weekly - 1 sets - 5-7 reps - 10-15 sec hold - Gentle Levator Scapulae Stretch  - 1 x daily - 7 x weekly - 1 sets - 5-7 reps - 10-15sec hold - Seated Gentle Upper Trapezius Stretch  - 1 x daily - 7 x weekly - 1 sets - 5-7 reps - 10-15 sec hold - Supine Cervical Sidebending  - 1 x daily - 7 x weekly - 1 sets - 3-5 reps - 15-30 sec hold - Supine Cervical Rotation AROM on Pillow  - 1 x daily - 7 x weekly - 1 sets - 3-5 reps - 15-30 sec hold   Access Code: [email or web address] (back) URL: [email or web address] Date: 06/07/2023 Prepared by: Waddell Southgate  Exercises - Supine Posterior Pelvic Tilt  - 1 x daily - 7 x weekly - 1 sets - 10 reps - 5 sec hold - Supine March  - 1 x daily - 7 x weekly - 3 sets - 10 reps - Supine Lower Trunk Rotation  - 1 x daily - 7 x weekly - 2-3 sets - 10 reps - Modified Thomas Stretch  - 1 x daily - 7 x weekly - 1 sets - 3 reps - 30 sec hold  ASSESSMENT:  CLINICAL IMPRESSION: Emphasis of skilled PT session on performing manual therapy and trialing gentle cervical stretches and strengthening exercises to address ongoing chronic knee pain. Pt does have an increase in her neck pain at end of session due to sensitivity of this region, encouraged her to monitor it over the next coupe of days then resume gentle stretches prescribed today if tolerated. Pt also exhibits severe disability based on her knee pain based on her score of 21% of normal function/79%  disability on the KOOS this date. Pt can continue to benefit from skilled PT services to work towards increased independence with management of her pain symptoms. Continue POC.   OBJECTIVE IMPAIRMENTS: Abnormal gait, decreased activity tolerance, decreased mobility, difficulty walking, decreased ROM, decreased strength, impaired perceived functional ability, improper body mechanics, postural dysfunction, and pain.   ACTIVITY LIMITATIONS: carrying, lifting, bending, sitting, standing, squatting, stairs, and transfers  PARTICIPATION LIMITATIONS: meal prep, cleaning, laundry, driving, and community activity  PERSONAL FACTORS: Sex, Time since onset of injury/illness/exacerbation, and 1-2 comorbidities:    hypertension, prediabetes, mixed connective tissue disease, chronic back painare also affecting patient's functional outcome.   REHAB POTENTIAL: Fair time since onset, chronicity of pain, mixed connective tissue disorder  CLINICAL DECISION MAKING: Stable/uncomplicated  EVALUATION COMPLEXITY: Low   GOALS: Goals reviewed with patient? Yes   SHORT TERM GOALS: Target date: 05/31/2023   Will be compliant with appropriate progressive HEP  Baseline: Goal status: IN PROGRESS   2.  Will be independent in appropriate edema management strategies including ice/elevation/use of compression garments  Baseline:  Goal status: NOT MET - goal no longer applicable   3.  Will demonstrate safe stair navigation sequencing with LRAD  Baseline:  Goal status: NOT MET - not assessed due  to pain levels     LONG TERM GOALS: Target date: 06/28/2023     MMT to have improved by one grade in all weak groups  Baseline:  Goal status: NOT MET - not assessed due to time constraints   2.  Pain to be no more than 3/10 on average in B knees  Baseline:  Goal status: NOT MET - pain continues to be 8/10 or greater   3.  Will complete TUG in 14 seconds or less with LRAD to show improved mobility/reduced fall risk   Baseline:  Goal status: NOT MET - not assessed due to time constraints   4.  KOOS score to improve by at least 10 points to show improved QOL/positive subjective improvement  Baseline:  Goal status: NOT MET - not assessed due to time constraints  NEW SHORT TERM GOALS: Target date: 06/22/2023   Pt will be independent with initial HEP for improved strength, ROM, transfers and gait and management of her pain symptoms. Baseline: Goal status: INITIAL   NEW LONG TERM GOALS: Target date: 07/13/2023   Pt will be independent with final HEP for improved strength, ROM, transfers and gait and management of her pain symptoms. Baseline:  Goal status: INITIAL  2.  Pt will improve her score on the Oswestry to </= 16/50 to demonstrate decreased disability level. Baseline: 21/50 (12/30) Goal status: INITIAL  3.  Pt will improve her score on the NDI to </= 23/50 to demonstrate decreased disability level. Baseline: 28/50 (12/30) Goal status: INITIAL  4.  KOOS score to improve by at least 10 points to show improved QOL/positive subjective improvement  Baseline: not assessed at eval?, 21% of normal function/79% disability (1/13) Goal status: IN PROGRESS   PLAN:  PT FREQUENCY: 1-2x/week (pending patient's availability)  PT DURATION: 6 weeks  PLANNED INTERVENTIONS: 97164- PT Re-evaluation, 97110-Therapeutic exercises, 97530- Therapeutic activity, 97112- Neuromuscular re-education, 97535- Self Care, [id number]- Manual therapy, [id number]- Gait training, [phone]- Aquatic Therapy, [phone]- Electrical stimulation (manual), Patient/Family education, Balance training, Stair training, Taping, Dry Needling, Joint mobilization, Spinal mobilization, DME instructions, Cryotherapy, and Moist heat.  PLAN FOR NEXT SESSION: how did she feel in her neck after last session? how is HEP? continue to alternate treating neck pain, back pain, and B knee pain; interested in DN-provided handout 1/6?, gastroc stretches, hip add stretch  (butterfly position or SKFO), SciFit, next session focus low back pain and knees again   Waddell Southgate, PT Waddell Southgate, PT, DPT, CSRS  06/14/2023, 2:00 PM    For all possible CPT codes, reference the Planned Interventions line above.     Check all conditions that are expected to impact treatment: {Conditions expected to impact treatment:Presence of Medical Equipment   If treatment provided at initial evaluation, no treatment charged due to lack of authorization.

## 2023-06-21 ENCOUNTER — Ambulatory Visit: Payer: Medicaid Other | Admitting: Physical Therapy

## 2023-06-21 DIAGNOSIS — R262 Difficulty in walking, not elsewhere classified: Secondary | ICD-10-CM

## 2023-06-21 DIAGNOSIS — M5459 Other low back pain: Secondary | ICD-10-CM

## 2023-06-21 DIAGNOSIS — R2681 Unsteadiness on feet: Secondary | ICD-10-CM

## 2023-06-21 DIAGNOSIS — G8929 Other chronic pain: Secondary | ICD-10-CM

## 2023-06-21 DIAGNOSIS — M6281 Muscle weakness (generalized): Secondary | ICD-10-CM

## 2023-06-21 DIAGNOSIS — M542 Cervicalgia: Secondary | ICD-10-CM

## 2023-06-21 DIAGNOSIS — M25561 Pain in right knee: Secondary | ICD-10-CM | POA: Diagnosis not present

## 2023-06-21 NOTE — Therapy (Signed)
OUTPATIENT PHYSICAL THERAPY THORACOLUMBAR + CERVICAL + LOWER EXTREMITY TREATMENT   Patient Name: Sonya Smith MRN: [id number] DOB:16-Sep-1960, 63 y.o., female Today's Date: 06/21/2023  END OF SESSION:  PT End of Session - 06/21/23 1359     Visit Number 7    Number of Visits 17    Date for PT Re-Evaluation 07/11/22   recert/eval   Authorization Type [employer] MCD    Authorization Time Period 05/03/23 to 07/11/22    Progress Note Due on Visit 10    PT Start Time 1358    PT Stop Time 1443    PT Time Calculation (min) 45 min    Activity Tolerance Patient limited by pain    Behavior During Therapy [facility] for tasks assessed/performed                Past Medical History:  Diagnosis Date   Anxiety 10/09/2010   Arthritis    of cervical and lumbar spine   Asthma    undiagnosed, was told by a physician in the ED, patient does not take any inhalers at home.    Chest pain    multiple ED visit, no objective evidence of cardiac or pulmonary causes  of chest pain in past   Hypertension 04/06/2019   Mixed connective tissue disease (HCC)    Restless leg syndrome 10/14/2017   Past Surgical History:  Procedure Laterality Date   drainage of peri tonsillar abscess     peri tonsillar abscess  10/11   drainage   Patient Active Problem List   Diagnosis Date Noted   Neck pain 05/18/2023   Bilateral knee pain 03/28/2023   Dental infection 08/17/2022   Obesity 08/17/2022   Pre-diabetes 08/17/2022   Chronic back pain 10/01/2020   Venous stasis of both lower extremities 01/19/2020   Hypertension 04/06/2019   Pleuritic chest pain 04/06/2019   Healthcare maintenance 10/14/2017   Chronic use of opiate drug for therapeutic purpose 06/10/2016   Mixed connective tissue disease (HCC) 01/07/2016    PCP: Morrie Sheldon, MD  REFERRING PROVIDER: Mercie Eon, MD  REFERRING DIAG: M54.89,G89.29 (ICD-10-CM) - Other chronic back pain  Rationale for Evaluation and Treatment:  Rehabilitation  THERAPY DIAG:  Chronic pain of right knee  Chronic pain of left knee  Difficulty in walking, not elsewhere classified  Unsteadiness on feet  Muscle weakness (generalized)  Other low back pain  Cervicalgia  ONSET DATE: 05/18/2023  SUBJECTIVE:  SUBJECTIVE STATEMENT:  "Sonya Smith" or "Sonya Smith/Sonya Smith"  Pt reports that her neck felt really good after last session. Today's pain: Neck pain: 6/10 Back pain: 4/10 Knee pain: 8/10, swollen  Pt reports her HEP is going well so far, no questions.   PERTINENT HISTORY:  past medical history of hypertension, prediabetes, mixed connective tissue disease, chronic back pain  PAIN:  Are you having pain? Yes: NPRS scale: 6 Pain location: back of neck and up back of her head Pain description: chronic, continuous ache Aggravating factors: turning head side to side Relieving factors: heating pad   Are you having pain? Yes: NPRS scale: 4/10 Pain location: mid-low back Pain description: achy Aggravating factors: trying to exercise, walk, do stuff Relieving factors: heating pad  Are you having pain? Yes: NPRS scale: 8/10 Pain location: B knees  PRECAUTIONS: Fall  RED FLAGS: None   WEIGHT BEARING RESTRICTIONS: No  FALLS:  Has patient fallen in last 6 months? No  LIVING ENVIRONMENT: Lives with:  lives with a  friend who disabled  Lives in: Other trailer Stairs:  3 STE no rails but uses a dolly for support when using steps  Has following equipment at home: Single point cane  OCCUPATION: on SSI  PLOF: Independent with gait, Independent with transfers, and Requires assistive device for independence  PATIENT GOALS: "to decrease pain"  NEXT MD VISIT: not sure  OBJECTIVE:  Note: Objective measures were completed at Evaluation  unless otherwise noted.  DIAGNOSTIC FINDINGS:  No recent imaging regarding back or neck  PATIENT SURVEYS:  Modified Oswestry 21/50 (moderate disability)  NDI 28/50 (severe disability)  COGNITION: Overall cognitive status: Within functional limits for tasks assessed     POSTURE: rounded shoulders, forward head, and posterior pelvic tilt  PALPATION: TTP in L cervical paraspinals, suboccipitals, levator, and UT; R side is mildly tender as compared to L side No TTP in central lumbar area but that is where she experiences pain  LUMBAR ROM:   AROM eval  Flexion WFL  Extension Limited to 25%, pain in mid-back  Right lateral flexion 50%  Left lateral flexion 50%  Right rotation 75%  Left rotation 75%   (Blank rows = not tested)   CERVICAL ROM:   Active ROM A/PROM (deg) eval  Flexion 12* (limited by pain)  Extension 9* (limited by pain)  Right lateral flexion 20* (limited by pain)  Left lateral flexion 20* (limited by pain)  Right rotation 27* (limited by pain)  Left rotation 30* (limited by pain)   (Blank rows = not tested)     TREATMENT:  TherEx Supine SKFO 3 x 30 sec each B for hip add stretch Seated gastroc stretch with gait belt 3 x 30 sec each B  Manual Therapy Suboccipital release 5 x 30 sec each Cervical distraction 5 x 30 sec each Onset of pain in L suboccipitals with this, deferred further treatment of this area  Demonstrated suboccipital release with tennis balls, increased sensitivity on L side. Educated her on use of pillow under head to decrease pressure from tennis balls. Also demonstrated cervical traction with towel stretch, pt able to perform return demo of this exercise, 3 x 30 sec each.  Added to HEP, see bolded below  TherAct Pt enters clinic without her [facility] today and exhibits increase in antalgic gait pattern and decreased gait speed, discussed use of SPC for community mobility and that it can help to offload her knee joints, decrease her pain,  and increase her safety and function with  mobility. Pt agreeable to use her SPC going forwards.   PATIENT EDUCATION:  Education details: continue neck, back, and knee HEP; added to neck HEP; continue to use [facility] Person educated: Patient Education method: Explanation, Demonstration, Tactile cues, Verbal cues, and Handouts Education comprehension: verbalized understanding, returned demonstration, and needs further education  HOME EXERCISE PROGRAM: Access Code: [email or web address] ([email or web address]) URL: [email or web address] Date: 05/03/2023 Prepared by: Nedra Hai   Exercises - Seated March with Resistance  - 1 x daily - 7 x weekly - 1 sets - 5 reps - 1 second  hold - Seated Small Alternating Straight Leg Lifts with Heel Touch  - 1 x daily - 7 x weekly - 1 sets - 5-10 reps - Supine Heel Slide  - 1 x daily - 7 x weekly - 1-2 sets - 10 reps - Supine Quad Set  - 1 x daily - 7 x weekly - 1-2 sets - 10 reps - 5 sec hold   Access Code: [email or web address] (neck) URL: [email or web address] Date: 05/31/2023 Prepared by: Peter Congo  Exercises - Seated Cervical Flexion AROM  - 1 x daily - 7 x weekly - 1 sets - 5-7 reps - 10-15 sec hold - Gentle Levator Scapulae Stretch  - 1 x daily - 7 x weekly - 1 sets - 5-7 reps - 10-15sec hold - Seated Gentle Upper Trapezius Stretch  - 1 x daily - 7 x weekly - 1 sets - 5-7 reps - 10-15 sec hold - Supine Cervical Sidebending  - 1 x daily - 7 x weekly - 1 sets - 3-5 reps - 15-30 sec hold - Supine Cervical Rotation AROM on Pillow  - 1 x daily - 7 x weekly - 1 sets - 3-5 reps - 15-30 sec hold - Supine Suboccipital Release with Tennis Balls  - 1 x daily - 7 x weekly - 1 sets - 1 reps - 5-10 min hold - Seated Cervical Traction  - 1 x daily - 7 x weekly - 1 sets - 3-5 reps - 30 sec hold   Access Code: [email or web address] (back) URL: [email or web address] Date: 06/07/2023 Prepared by: Peter Congo  Exercises - Supine Posterior Pelvic Tilt  - 1 x  daily - 7 x weekly - 1 sets - 10 reps - 5 sec hold - Supine March  - 1 x daily - 7 x weekly - 3 sets - 10 reps - Supine Lower Trunk Rotation  - 1 x daily - 7 x weekly - 2-3 sets - 10 reps - Modified Thomas Stretch  - 1 x daily - 7 x weekly - 1 sets - 3 reps - 30 sec hold   ASSESSMENT:  CLINICAL IMPRESSION: Emphasis of skilled PT session on performing manual therapy to cervical region that brought patient relief last session as well as adding ways she can do this at home to her HEP to increase independence with management of her pain symptoms. Also discussed benefits of use of an AD for pain management. Pt has met 1/1 STG due to being independent with her initial HEP. Pt continues to benefit from skilled PT services to address her ongoing chronic neck, back, and B knee pain. Continue POC.   OBJECTIVE IMPAIRMENTS: Abnormal gait, decreased activity tolerance, decreased mobility, difficulty walking, decreased ROM, decreased strength, impaired perceived functional ability, improper body mechanics, postural dysfunction, and pain.   ACTIVITY LIMITATIONS: carrying, lifting, bending, sitting, standing, squatting, stairs, and transfers  PARTICIPATION LIMITATIONS: meal prep, cleaning, laundry, driving, and community activity  PERSONAL FACTORS: Sex,  Time since onset of injury/illness/exacerbation, and 1-2 comorbidities:    hypertension, prediabetes, mixed connective tissue disease, chronic back painare also affecting patient's functional outcome.   REHAB POTENTIAL: Fair time since onset, chronicity of pain, mixed connective tissue disorder  CLINICAL DECISION MAKING: Stable/uncomplicated  EVALUATION COMPLEXITY: Low   GOALS: Goals reviewed with patient? Yes   NEW SHORT TERM GOALS: Target date: 06/22/2023  Pt will be independent with initial HEP for improved strength, ROM, transfers and gait and management of her pain symptoms. Baseline: Goal status: MET   NEW LONG TERM GOALS: Target date:  07/13/2023   Pt will be independent with final HEP for improved strength, ROM, transfers and gait and management of her pain symptoms. Baseline:  Goal status: INITIAL  2.  Pt will improve her score on the Oswestry to </= 16/50 to demonstrate decreased disability level. Baseline: 21/50 (12/30) Goal status: INITIAL  3.  Pt will improve her score on the NDI to </= 23/50 to demonstrate decreased disability level. Baseline: 28/50 (12/30) Goal status: INITIAL  4.  KOOS score to improve by at least 10 points to show improved QOL/positive subjective improvement  Baseline: not assessed at eval?, 21% of normal function/79% disability (1/13) Goal status: IN PROGRESS   PLAN:  PT FREQUENCY: 1-2x/week (pending patient's availability)  PT DURATION: 6 weeks  PLANNED INTERVENTIONS: 97164- PT Re-evaluation, 97110-Therapeutic exercises, 97530- Therapeutic activity, 97112- Neuromuscular re-education, 97535- Self Care, 16109- Manual therapy, [id number]- Gait training, [phone]- Aquatic Therapy, [phone]- Electrical stimulation (manual), Patient/Family education, Balance training, Stair training, Taping, Dry Needling, Joint mobilization, Spinal mobilization, DME instructions, Cryotherapy, and Moist heat.  PLAN FOR NEXT SESSION: how did she feel in her neck after last session? how is HEP? continue to alternate treating neck pain, back pain, and B knee pain; interested in DN-provided handout 1/6?, SciFit, next session focus low back pain and knees again (SLR - lift overs, supine march progression, sitting on swiss ball)   Peter Congo, PT Peter Congo, PT, DPT, CSRS  06/21/2023, 2:44 PM    For all possible CPT codes, reference the Planned Interventions line above.     Check all conditions that are expected to impact treatment: {Conditions expected to impact treatment:Presence of Medical Equipment   If treatment provided at initial evaluation, no treatment charged due to lack of authorization.

## 2023-06-22 ENCOUNTER — Other Ambulatory Visit (HOSPITAL_COMMUNITY): Payer: Self-pay

## 2023-06-22 ENCOUNTER — Other Ambulatory Visit: Payer: Self-pay | Admitting: Student

## 2023-06-22 DIAGNOSIS — M351 Other overlap syndromes: Secondary | ICD-10-CM

## 2023-06-22 DIAGNOSIS — Z79891 Long term (current) use of opiate analgesic: Secondary | ICD-10-CM

## 2023-06-23 ENCOUNTER — Other Ambulatory Visit (HOSPITAL_COMMUNITY): Payer: Self-pay

## 2023-06-24 ENCOUNTER — Other Ambulatory Visit (HOSPITAL_COMMUNITY): Payer: Self-pay

## 2023-06-25 ENCOUNTER — Other Ambulatory Visit (HOSPITAL_COMMUNITY): Payer: Self-pay

## 2023-06-25 MED ORDER — OXYCODONE-ACETAMINOPHEN 5-325 MG PO TABS
1.0000 | ORAL_TABLET | Freq: Two times a day (BID) | ORAL | 0 refills | Status: DC | PRN
  Filled 2023-06-25: qty 60, 30d supply, fill #0

## 2023-06-25 NOTE — Telephone Encounter (Signed)
Received after hours page. Called patient and confirmed DOB. Request for refill of Percocet 5-325 mg BID PRN. Last fill 05/24/23. PDMP reviewed and appropriate. Sent refill for Percocet 5-325 mg (#60 tablets, 0 refill) to [facility]. Patient verbalizes understanding of plan and all questions addressed.

## 2023-06-28 ENCOUNTER — Ambulatory Visit: Payer: Medicaid Other | Admitting: Physical Therapy

## 2023-06-28 DIAGNOSIS — M25561 Pain in right knee: Secondary | ICD-10-CM | POA: Diagnosis not present

## 2023-06-28 DIAGNOSIS — M6281 Muscle weakness (generalized): Secondary | ICD-10-CM

## 2023-06-28 DIAGNOSIS — R262 Difficulty in walking, not elsewhere classified: Secondary | ICD-10-CM

## 2023-06-28 DIAGNOSIS — M542 Cervicalgia: Secondary | ICD-10-CM

## 2023-06-28 DIAGNOSIS — M5459 Other low back pain: Secondary | ICD-10-CM

## 2023-06-28 DIAGNOSIS — G8929 Other chronic pain: Secondary | ICD-10-CM

## 2023-06-28 DIAGNOSIS — R2681 Unsteadiness on feet: Secondary | ICD-10-CM

## 2023-06-28 NOTE — Therapy (Signed)
OUTPATIENT PHYSICAL THERAPY THORACOLUMBAR + CERVICAL + LOWER EXTREMITY TREATMENT   Patient Name: Sonya Smith MRN: [id number] DOB:Oct 06, 1960, 63 y.o., female Today's Date: 06/28/2023  END OF SESSION:  PT End of Session - 06/28/23 1155     Visit Number 8    Number of Visits 17    Date for PT Re-Evaluation 07/11/22   recert/eval   Authorization Type [employer] MCD    Authorization Time Period 05/03/23 to 07/11/22    Progress Note Due on Visit 10    PT Start Time 1155   pt arrived late/per front not on schedule?   PT Stop Time 1231    PT Time Calculation (min) 36 min    Activity Tolerance Patient limited by pain    Behavior During Therapy [facility] for tasks assessed/performed                 Past Medical History:  Diagnosis Date   Anxiety 10/09/2010   Arthritis    of cervical and lumbar spine   Asthma    undiagnosed, was told by a physician in the ED, patient does not take any inhalers at home.    Chest pain    multiple ED visit, no objective evidence of cardiac or pulmonary causes  of chest pain in past   Hypertension 04/06/2019   Mixed connective tissue disease (HCC)    Restless leg syndrome 10/14/2017   Past Surgical History:  Procedure Laterality Date   drainage of peri tonsillar abscess     peri tonsillar abscess  10/11   drainage   Patient Active Problem List   Diagnosis Date Noted   Neck pain 05/18/2023   Bilateral knee pain 03/28/2023   Dental infection 08/17/2022   Obesity 08/17/2022   Pre-diabetes 08/17/2022   Chronic back pain 10/01/2020   Venous stasis of both lower extremities 01/19/2020   Hypertension 04/06/2019   Pleuritic chest pain 04/06/2019   Healthcare maintenance 10/14/2017   Chronic use of opiate drug for therapeutic purpose 06/10/2016   Mixed connective tissue disease (HCC) 01/07/2016    PCP: Morrie Sheldon, MD  REFERRING PROVIDER: Mercie Eon, MD  REFERRING DIAG: M54.89,G89.29 (ICD-10-CM) - Other chronic back pain  Rationale for  Evaluation and Treatment: Rehabilitation  THERAPY DIAG:  Chronic pain of right knee  Chronic pain of left knee  Difficulty in walking, not elsewhere classified  Unsteadiness on feet  Muscle weakness (generalized)  Other low back pain  Cervicalgia  ONSET DATE: 05/18/2023  SUBJECTIVE:  SUBJECTIVE STATEMENT:  "[name]" or "[name]"  Pt reports that she was hurting after last session. Pt reports she is hurting a lot today, hasn't done many exercises due to her pain.  Today's pain: Neck pain: 7/10 (worse on L side) Back pain: 4/10 Knee pain: 8/10, swollen   PERTINENT HISTORY:  past medical history of hypertension, prediabetes, mixed connective tissue disease, chronic back pain  PAIN:  Are you having pain? Yes: NPRS scale: 7 Pain location: back of neck and up back of her head Pain description: chronic, continuous ache Aggravating factors: turning head side to side Relieving factors: heating pad   Are you having pain? Yes: NPRS scale: 4/10 Pain location: mid-low back Pain description: achy Aggravating factors: trying to exercise, walk, do stuff Relieving factors: heating pad  Are you having pain? Yes: NPRS scale: 8/10 Pain location: B knees  PRECAUTIONS: Fall  RED FLAGS: None   WEIGHT BEARING RESTRICTIONS: No  FALLS:  Has patient fallen in last 6 months? No  LIVING ENVIRONMENT: Lives with:  lives with a  friend who disabled  Lives in: Other trailer Stairs:  3 STE no rails but uses a dolly for support when using steps  Has following equipment at home: Single point cane  OCCUPATION: on SSI  PLOF: Independent with gait, Independent with transfers, and Requires assistive device for independence  PATIENT GOALS: "to decrease pain"  NEXT MD VISIT: not sure  OBJECTIVE:   Note: Objective measures were completed at Evaluation unless otherwise noted.  DIAGNOSTIC FINDINGS:  No recent imaging regarding back or neck  PATIENT SURVEYS:  Modified Oswestry 21/50 (moderate disability)  NDI 28/50 (severe disability)  COGNITION: Overall cognitive status: Within functional limits for tasks assessed     POSTURE: rounded shoulders, forward head, and posterior pelvic tilt  PALPATION: TTP in L cervical paraspinals, suboccipitals, levator, and UT; R side is mildly tender as compared to L side No TTP in central lumbar area but that is where she experiences pain  LUMBAR ROM:   AROM eval  Flexion WFL  Extension Limited to 25%, pain in mid-back  Right lateral flexion 50%  Left lateral flexion 50%  Right rotation 75%  Left rotation 75%   (Blank rows = not tested)   CERVICAL ROM:   Active ROM A/PROM (deg) eval  Flexion 12* (limited by pain)  Extension 9* (limited by pain)  Right lateral flexion 20* (limited by pain)  Left lateral flexion 20* (limited by pain)  Right rotation 27* (limited by pain)  Left rotation 30* (limited by pain)   (Blank rows = not tested)     TREATMENT:  TherEx Supine gentle cervical rotation on pillow x 10 reps B Supine upper trap stretch x 15 sec x 5 reps Increase in pain in L side with L lateral SB, deferred further repetitions on this side  Supine chin tucks with head supported on pillow and in therapist hands x 10 reps  Supine marches x 10 reps B Onset of B ankle and foot pain  SciFit level 3 x 5 min with use of B UE/LE for gentle ROM in a more pain-free manner. Pt with onset of some heart "quivering" during exercise that lasts less than a minute. Pt reports that her doctor is aware of this, has this sensation about one time per month.  Manual Therapy TPR and STM to L and R cervical paraspinals, suboccipitals, and levator scap. Pt initially with most pain and palpable trigger points in L cervical paraspinals, decrease in  tension felt following STM and TPR. Pt also with increased tightness in her R levator scap. Performed PROM R levator scap stretch 4 x 30 sec each, decrease in tension following manual therapy.   PATIENT EDUCATION:  Education details: continue neck, back, and knee HEP even when in pain as ROM can lubricate the joints; use of heating pad, continue to use [facility] Person educated: Patient Education method: Explanation and Demonstration Education comprehension: verbalized understanding, returned demonstration, and needs further education  HOME EXERCISE PROGRAM: Access Code: [email or web address] ([email or web address]) URL: [email or web address] Date: 05/03/2023 Prepared by: Nedra Hai   Exercises - Seated March with Resistance  - 1 x daily - 7 x weekly - 1 sets - 5 reps - 1 second  hold - Seated Small Alternating Straight Leg Lifts with Heel Touch  - 1 x daily - 7 x weekly - 1 sets - 5-10 reps - Supine Heel Slide  - 1 x daily - 7 x weekly - 1-2 sets - 10 reps - Supine Quad Set  - 1 x daily - 7 x weekly - 1-2 sets - 10 reps - 5 sec hold   Access Code: [email or web address] (neck) URL: [email or web address] Date: 05/31/2023 Prepared by: Peter Congo  Exercises - Seated Cervical Flexion AROM  - 1 x daily - 7 x weekly - 1 sets - 5-7 reps - 10-15 sec hold - Gentle Levator Scapulae Stretch  - 1 x daily - 7 x weekly - 1 sets - 5-7 reps - 10-15sec hold - Seated Gentle Upper Trapezius Stretch  - 1 x daily - 7 x weekly - 1 sets - 5-7 reps - 10-15 sec hold - Supine Cervical Sidebending  - 1 x daily - 7 x weekly - 1 sets - 3-5 reps - 15-30 sec hold - Supine Cervical Rotation AROM on Pillow  - 1 x daily - 7 x weekly - 1 sets - 3-5 reps - 15-30 sec hold - Supine Suboccipital Release with Tennis Balls  - 1 x daily - 7 x weekly - 1 sets - 1 reps - 5-10 min hold - Seated Cervical Traction  - 1 x daily - 7 x weekly - 1 sets - 3-5 reps - 30 sec hold   Access Code: [email or web address] (back) URL:  [email or web address] Date: 06/07/2023 Prepared by: Peter Congo  Exercises - Supine Posterior Pelvic Tilt  - 1 x daily - 7 x weekly - 1 sets - 10 reps - 5 sec hold - Supine March  - 1 x daily - 7 x weekly - 3 sets - 10 reps - Supine Lower Trunk Rotation  - 1 x daily - 7 x weekly - 2-3 sets - 10 reps - Modified Thomas Stretch  - 1 x daily - 7 x weekly - 1 sets - 3 reps - 30 sec hold   ASSESSMENT:  CLINICAL IMPRESSION: Emphasis of skilled PT session on having patient perform gentle cervical AROM in a pain-free range, performing manual therapy to address ongoing pain and tightness in cervical muscles, and working on global ROM with use of SciFit. Pt with fair tolerance for therapy this date due to an increase in her neck pain. Pt does have some relief of tightness in her cervical muscles following manual therapy and improved ROM though her pain continues to be severe. Encouraged use of a heating pad for pain relief and to continue gentle ROM for joint lubrication. Pt continues to benefit from skilled PT services to work towards increasing her independence with management of her pain  symptoms and educating her on the purpose of PT. Continue POC.   OBJECTIVE IMPAIRMENTS: Abnormal gait, decreased activity tolerance, decreased mobility, difficulty walking, decreased ROM, decreased strength, impaired perceived functional ability, improper body mechanics, postural dysfunction, and pain.   ACTIVITY LIMITATIONS: carrying, lifting, bending, sitting, standing, squatting, stairs, and transfers  PARTICIPATION LIMITATIONS: meal prep, cleaning, laundry, driving, and community activity  PERSONAL FACTORS: Sex, Time since onset of injury/illness/exacerbation, and 1-2 comorbidities:    hypertension, prediabetes, mixed connective tissue disease, chronic back painare also affecting patient's functional outcome.   REHAB POTENTIAL: Fair time since onset, chronicity of pain, mixed connective tissue  disorder  CLINICAL DECISION MAKING: Stable/uncomplicated  EVALUATION COMPLEXITY: Low   GOALS: Goals reviewed with patient? Yes   NEW SHORT TERM GOALS: Target date: 06/22/2023  Pt will be independent with initial HEP for improved strength, ROM, transfers and gait and management of her pain symptoms. Baseline: Goal status: MET   NEW LONG TERM GOALS: Target date: 07/13/2023   Pt will be independent with final HEP for improved strength, ROM, transfers and gait and management of her pain symptoms. Baseline:  Goal status: INITIAL  2.  Pt will improve her score on the Oswestry to </= 16/50 to demonstrate decreased disability level. Baseline: 21/50 (12/30) Goal status: INITIAL  3.  Pt will improve her score on the NDI to </= 23/50 to demonstrate decreased disability level. Baseline: 28/50 (12/30) Goal status: INITIAL  4.  KOOS score to improve by at least 10 points to show improved QOL/positive subjective improvement  Baseline: not assessed at eval?, 21% of normal function/79% disability (1/13) Goal status: IN PROGRESS   PLAN:  PT FREQUENCY: 1-2x/week (pending patient's availability)  PT DURATION: 6 weeks  PLANNED INTERVENTIONS: 97164- PT Re-evaluation, 97110-Therapeutic exercises, 97530- Therapeutic activity, 97112- Neuromuscular re-education, 97535- Self Care, 95621- Manual therapy, [id number]- Gait training, [phone]- Aquatic Therapy, [phone]- Electrical stimulation (manual), Patient/Family education, Balance training, Stair training, Taping, Dry Needling, Joint mobilization, Spinal mobilization, DME instructions, Cryotherapy, and Moist heat.  PLAN FOR NEXT SESSION: how did she feel in her neck after last session? how is HEP? continue to alternate treating neck pain, back pain, and B knee pain; DOES NOT WANT DRY NEEDLING, SciFit, next session focus low back pain and knees again (SLR - lift overs, supine march progression, sitting on swiss ball), plan on goal reassess 2/10 and determine  POC   Peter Congo, PT Peter Congo, PT, DPT, CSRS  06/28/2023, 12:33 PM    For all possible CPT codes, reference the Planned Interventions line above.     Check all conditions that are expected to impact treatment: {Conditions expected to impact treatment:Presence of Medical Equipment   If treatment provided at initial evaluation, no treatment charged due to lack of authorization.

## 2023-07-05 ENCOUNTER — Ambulatory Visit: Payer: Medicaid Other | Attending: Internal Medicine | Admitting: Physical Therapy

## 2023-07-05 DIAGNOSIS — R2681 Unsteadiness on feet: Secondary | ICD-10-CM | POA: Diagnosis present

## 2023-07-05 DIAGNOSIS — M25562 Pain in left knee: Secondary | ICD-10-CM | POA: Diagnosis present

## 2023-07-05 DIAGNOSIS — M6281 Muscle weakness (generalized): Secondary | ICD-10-CM | POA: Insufficient documentation

## 2023-07-05 DIAGNOSIS — M542 Cervicalgia: Secondary | ICD-10-CM | POA: Diagnosis present

## 2023-07-05 DIAGNOSIS — M25561 Pain in right knee: Secondary | ICD-10-CM | POA: Diagnosis present

## 2023-07-05 DIAGNOSIS — G8929 Other chronic pain: Secondary | ICD-10-CM | POA: Diagnosis present

## 2023-07-05 DIAGNOSIS — R262 Difficulty in walking, not elsewhere classified: Secondary | ICD-10-CM | POA: Diagnosis present

## 2023-07-05 DIAGNOSIS — M5459 Other low back pain: Secondary | ICD-10-CM | POA: Insufficient documentation

## 2023-07-05 NOTE — Therapy (Signed)
OUTPATIENT PHYSICAL THERAPY THORACOLUMBAR + CERVICAL + LOWER EXTREMITY TREATMENT   Patient Name: Sonya Smith MRN: [id number] DOB:23-Sep-1960, 63 y.o., female Today's Date: 07/05/2023  END OF SESSION:  PT End of Session - 07/05/23 1157     Visit Number 9    Number of Visits 17    Date for PT Re-Evaluation 07/11/22   recert/eval   Authorization Type [employer] MCD    Authorization Time Period 05/03/23 to 07/11/22    Progress Note Due on Visit 10    PT Start Time 1158   pt arrived late   PT Stop Time 1230    PT Time Calculation (min) 32 min    Equipment Utilized During Treatment Gait belt    Activity Tolerance Patient limited by pain    Behavior During Therapy WFL for tasks assessed/performed                  Past Medical History:  Diagnosis Date   Anxiety 10/09/2010   Arthritis    of cervical and lumbar spine   Asthma    undiagnosed, was told by a physician in the ED, patient does not take any inhalers at home.    Chest pain    multiple ED visit, no objective evidence of cardiac or pulmonary causes  of chest pain in past   Hypertension 04/06/2019   Mixed connective tissue disease (HCC)    Restless leg syndrome 10/14/2017   Past Surgical History:  Procedure Laterality Date   drainage of peri tonsillar abscess     peri tonsillar abscess  10/11   drainage   Patient Active Problem List   Diagnosis Date Noted   Neck pain 05/18/2023   Bilateral knee pain 03/28/2023   Dental infection 08/17/2022   Obesity 08/17/2022   Pre-diabetes 08/17/2022   Chronic back pain 10/01/2020   Venous stasis of both lower extremities 01/19/2020   Hypertension 04/06/2019   Pleuritic chest pain 04/06/2019   Healthcare maintenance 10/14/2017   Chronic use of opiate drug for therapeutic purpose 06/10/2016   Mixed connective tissue disease (HCC) 01/07/2016    PCP: Morrie Sheldon, MD  REFERRING PROVIDER: Mercie Eon, MD  REFERRING DIAG: M54.89,G89.29 (ICD-10-CM) - Other chronic back  pain  Rationale for Evaluation and Treatment: Rehabilitation  THERAPY DIAG:  Chronic pain of right knee  Chronic pain of left knee  Difficulty in walking, not elsewhere classified  Unsteadiness on feet  Muscle weakness (generalized)  Other low back pain  Cervicalgia  ONSET DATE: 05/18/2023  SUBJECTIVE:  SUBJECTIVE STATEMENT:  "[name]" or "[name]"  Pt felt sore after last session, her neck still hurts when she turns it but can turn it more R/L. Pt with ongoing knee pain, knees swell throughout the day. Pt has tried compression socks but they made her pain worse. Pt just took her pain meds 30 min before PT.  Today's pain: Neck pain: 6/10 (worse on L side) Back pain: 4/10 Knee pain: 7/10, swollen   PERTINENT HISTORY:  past medical history of hypertension, prediabetes, mixed connective tissue disease, chronic back pain  PAIN:  Are you having pain? Yes: NPRS scale: 6 Pain location: back of neck and up back of her head Pain description: chronic, continuous ache Aggravating factors: turning head side to side Relieving factors: heating pad   Are you having pain? Yes: NPRS scale: 4/10 Pain location: mid-low back Pain description: achy Aggravating factors: trying to exercise, walk, do stuff Relieving factors: heating pad  Are you having pain? Yes: NPRS scale: 7/10 Pain location: B knees  PRECAUTIONS: Fall  RED FLAGS: None   WEIGHT BEARING RESTRICTIONS: No  FALLS:  Has patient fallen in last 6 months? No  LIVING ENVIRONMENT: Lives with:  lives with a  friend who disabled  Lives in: Other trailer Stairs:  3 STE no rails but uses a dolly for support when using steps  Has following equipment at home: Single point cane  OCCUPATION: on SSI  PLOF: Independent with gait,  Independent with transfers, and Requires assistive device for independence  PATIENT GOALS: "to decrease pain"  NEXT MD VISIT: not sure  OBJECTIVE:  Note: Objective measures were completed at Evaluation unless otherwise noted.  DIAGNOSTIC FINDINGS:  No recent imaging regarding back or neck  PATIENT SURVEYS:  Modified Oswestry 21/50 (moderate disability)  NDI 28/50 (severe disability)  COGNITION: Overall cognitive status: Within functional limits for tasks assessed     POSTURE: rounded shoulders, forward head, and posterior pelvic tilt  PALPATION: TTP in L cervical paraspinals, suboccipitals, levator, and UT; R side is mildly tender as compared to L side No TTP in central lumbar area but that is where she experiences pain  LUMBAR ROM:   AROM eval  Flexion WFL  Extension Limited to 25%, pain in mid-back  Right lateral flexion 50%  Left lateral flexion 50%  Right rotation 75%  Left rotation 75%   (Blank rows = not tested)   CERVICAL ROM:   Active ROM A/PROM (deg) eval  Flexion 12* (limited by pain)  Extension 9* (limited by pain)  Right lateral flexion 20* (limited by pain)  Left lateral flexion 20* (limited by pain)  Right rotation 27* (limited by pain)  Left rotation 30* (limited by pain)   (Blank rows = not tested)     TREATMENT:  TherEx Seated anterior leans on blue Swiss ball x 10 reps  Seated on green Swiss ball to work on gentle ROM: Forward/backward pelvic tilts x 10 reps each direction L/R pelvic tilts x 10 reps each direction CW/CCW pelvic circles x 10 reps each direction Seated marches 2 x 10 reps B Seated LAQ 2 x 10 reps B Avoided end range on R knee due to joint "popping" when locked out in extension   PATIENT EDUCATION:  Education details: continue neck, back, and knee HEP even when in pain as ROM can lubricate the joints; PT POC with plan to possibly d/c next visit Education method: Explanation and Demonstration Education comprehension:  verbalized understanding, returned demonstration, and needs further education  HOME  EXERCISE PROGRAM: Access Code: [email or web address] ([email or web address]) URL: [email or web address] Date: 05/03/2023 Prepared by: Nedra Hai   Exercises - Seated March with Resistance  - 1 x daily - 7 x weekly - 1 sets - 5 reps - 1 second  hold - Seated Small Alternating Straight Leg Lifts with Heel Touch  - 1 x daily - 7 x weekly - 1 sets - 5-10 reps - Supine Heel Slide  - 1 x daily - 7 x weekly - 1-2 sets - 10 reps - Supine Quad Set  - 1 x daily - 7 x weekly - 1-2 sets - 10 reps - 5 sec hold   Access Code: [email or web address] (neck) URL: [email or web address] Date: 05/31/2023 Prepared by: Peter Congo  Exercises - Seated Cervical Flexion AROM  - 1 x daily - 7 x weekly - 1 sets - 5-7 reps - 10-15 sec hold - Gentle Levator Scapulae Stretch  - 1 x daily - 7 x weekly - 1 sets - 5-7 reps - 10-15sec hold - Seated Gentle Upper Trapezius Stretch  - 1 x daily - 7 x weekly - 1 sets - 5-7 reps - 10-15 sec hold - Supine Cervical Sidebending  - 1 x daily - 7 x weekly - 1 sets - 3-5 reps - 15-30 sec hold - Supine Cervical Rotation AROM on Pillow  - 1 x daily - 7 x weekly - 1 sets - 3-5 reps - 15-30 sec hold - Supine Suboccipital Release with Tennis Balls  - 1 x daily - 7 x weekly - 1 sets - 1 reps - 5-10 min hold - Seated Cervical Traction  - 1 x daily - 7 x weekly - 1 sets - 3-5 reps - 30 sec hold   Access Code: [email or web address] (back) URL: [email or web address] Date: 06/07/2023 Prepared by: Peter Congo  Exercises - Supine Posterior Pelvic Tilt  - 1 x daily - 7 x weekly - 1 sets - 10 reps - 5 sec hold - Supine March  - 1 x daily - 7 x weekly - 3 sets - 10 reps - Supine Lower Trunk Rotation  - 1 x daily - 7 x weekly - 2-3 sets - 10 reps - Modified Thomas Stretch  - 1 x daily - 7 x weekly - 1 sets - 3 reps - 30 sec hold   ASSESSMENT:  CLINICAL IMPRESSION: Emphasis of skilled PT session on working  on gentle stretching and strengthening exercises in a pain-free range as possible. Pt with ongoing limited tolerance for exercises due to ongoing pain but continues to benefit from skilled PT to encouraged gentle movement to prevent an increase in stiffness, decrease in ROM, and an increase in pain. Continue POC.   OBJECTIVE IMPAIRMENTS: Abnormal gait, decreased activity tolerance, decreased mobility, difficulty walking, decreased ROM, decreased strength, impaired perceived functional ability, improper body mechanics, postural dysfunction, and pain.   ACTIVITY LIMITATIONS: carrying, lifting, bending, sitting, standing, squatting, stairs, and transfers  PARTICIPATION LIMITATIONS: meal prep, cleaning, laundry, driving, and community activity  PERSONAL FACTORS: Sex, Time since onset of injury/illness/exacerbation, and 1-2 comorbidities:    hypertension, prediabetes, mixed connective tissue disease, chronic back painare also affecting patient's functional outcome.   REHAB POTENTIAL: Fair time since onset, chronicity of pain, mixed connective tissue disorder  CLINICAL DECISION MAKING: Stable/uncomplicated  EVALUATION COMPLEXITY: Low   GOALS: Goals reviewed with patient? Yes   NEW SHORT TERM GOALS: Target date: 06/22/2023  Pt will be independent with initial HEP for improved strength, ROM, transfers and gait and management of  her pain symptoms. Baseline: Goal status: MET   NEW LONG TERM GOALS: Target date: 07/13/2023   Pt will be independent with final HEP for improved strength, ROM, transfers and gait and management of her pain symptoms. Baseline:  Goal status: INITIAL  2.  Pt will improve her score on the Oswestry to </= 16/50 to demonstrate decreased disability level. Baseline: 21/50 (12/30) Goal status: INITIAL  3.  Pt will improve her score on the NDI to </= 23/50 to demonstrate decreased disability level. Baseline: 28/50 (12/30) Goal status: INITIAL  4.  KOOS score to  improve by at least 10 points to show improved QOL/positive subjective improvement  Baseline: not assessed at eval?, 21% of normal function/79% disability (1/13) Goal status: IN PROGRESS   PLAN:  PT FREQUENCY: 1-2x/week (pending patient's availability)  PT DURATION: 6 weeks  PLANNED INTERVENTIONS: 97164- PT Re-evaluation, 97110-Therapeutic exercises, 97530- Therapeutic activity, 97112- Neuromuscular re-education, 97535- Self Care, 81191- Manual therapy, [id number]- Gait training, [phone]- Aquatic Therapy, [phone]- Electrical stimulation (manual), Patient/Family education, Balance training, Stair training, Taping, Dry Needling, Joint mobilization, Spinal mobilization, DME instructions, Cryotherapy, and Moist heat.  PLAN FOR NEXT SESSION: 10th PN, continue to alternate treating neck pain, back pain, and B knee pain; DOES NOT WANT DRY NEEDLING, SciFit,Cmeasure cervical ROM and assess LTG - possible d/c?   Peter Congo, PT Peter Congo, PT, DPT, CSRS  07/05/2023, 12:33 PM    For all possible CPT codes, reference the Planned Interventions line above.     Check all conditions that are expected to impact treatment: {Conditions expected to impact treatment:Presence of Medical Equipment   If treatment provided at initial evaluation, no treatment charged due to lack of authorization.

## 2023-07-12 ENCOUNTER — Ambulatory Visit: Payer: Medicaid Other | Admitting: Physical Therapy

## 2023-07-12 DIAGNOSIS — M6281 Muscle weakness (generalized): Secondary | ICD-10-CM

## 2023-07-12 DIAGNOSIS — M25561 Pain in right knee: Secondary | ICD-10-CM | POA: Diagnosis not present

## 2023-07-12 DIAGNOSIS — G8929 Other chronic pain: Secondary | ICD-10-CM

## 2023-07-12 DIAGNOSIS — R262 Difficulty in walking, not elsewhere classified: Secondary | ICD-10-CM

## 2023-07-12 DIAGNOSIS — M5459 Other low back pain: Secondary | ICD-10-CM

## 2023-07-12 DIAGNOSIS — M542 Cervicalgia: Secondary | ICD-10-CM

## 2023-07-12 DIAGNOSIS — R2681 Unsteadiness on feet: Secondary | ICD-10-CM

## 2023-07-12 NOTE — Therapy (Signed)
OUTPATIENT PHYSICAL THERAPY THORACOLUMBAR + CERVICAL + LOWER EXTREMITY TREATMENT - 10th VISIT PROGRESS NOTE and RECERTIFICATION   Patient Name: Sonya Smith MRN: [id number] DOB:09-29-1960, 63 y.o., female Today's Date: 07/13/2023    Physical Therapy Progress Note   Dates of Reporting Period: 05/03/2023 - 07/12/2023  See Note below for Objective Data and Assessment of Progress/Goals.  Thank you for the referral of this patient. Peter Congo, PT, DPT, CSRS    END OF SESSION:  PT End of Session - 07/12/23 1323     Visit Number 10    Number of Visits 17    Date for PT Re-Evaluation 08/10/23   recert/eval   Authorization Type [employer] MCD    Authorization Time Period 05/03/23 to 07/11/22    Progress Note Due on Visit 10    PT Start Time 1323    PT Stop Time 1402    PT Time Calculation (min) 39 min    Equipment Utilized During Treatment Gait belt    Activity Tolerance Patient limited by pain    Behavior During Therapy WFL for tasks assessed/performed                   Past Medical History:  Diagnosis Date   Anxiety 10/09/2010   Arthritis    of cervical and lumbar spine   Asthma    undiagnosed, was told by a physician in the ED, patient does not take any inhalers at home.    Chest pain    multiple ED visit, no objective evidence of cardiac or pulmonary causes  of chest pain in past   Hypertension 04/06/2019   Mixed connective tissue disease (HCC)    Restless leg syndrome 10/14/2017   Past Surgical History:  Procedure Laterality Date   drainage of peri tonsillar abscess     peri tonsillar abscess  10/11   drainage   Patient Active Problem List   Diagnosis Date Noted   Neck pain 05/18/2023   Bilateral knee pain 03/28/2023   Dental infection 08/17/2022   Obesity 08/17/2022   Pre-diabetes 08/17/2022   Chronic back pain 10/01/2020   Venous stasis of both lower extremities 01/19/2020   Hypertension 04/06/2019   Pleuritic chest pain 04/06/2019    Healthcare maintenance 10/14/2017   Chronic use of opiate drug for therapeutic purpose 06/10/2016   Mixed connective tissue disease (HCC) 01/07/2016    PCP: Morrie Sheldon, MD  REFERRING PROVIDER: Mercie Eon, MD  REFERRING DIAG: M54.89,G89.29 (ICD-10-CM) - Other chronic back pain  Rationale for Evaluation and Treatment: Rehabilitation  THERAPY DIAG:  Chronic pain of right knee  Chronic pain of left knee  Difficulty in walking, not elsewhere classified  Unsteadiness on feet  Muscle weakness (generalized)  Cervicalgia  Other low back pain  ONSET DATE: 05/18/2023  SUBJECTIVE:  SUBJECTIVE STATEMENT:  "Chaunta" or "[name]"  Pt reports ongoing chronic pain (see below), worse today due to colder weather. Pt reports her B knees are more swollen today.  Today's pain: Neck pain: 7/10 (worse on L side) Back pain: 5/10 Knee pain: 9/10, swollen   PERTINENT HISTORY:  past medical history of hypertension, prediabetes, mixed connective tissue disease, chronic back pain  PAIN:  Are you having pain? Yes: NPRS scale: 7 Pain location: back of neck and up back of her head Pain description: chronic, continuous ache Aggravating factors: turning head side to side Relieving factors: heating pad   Are you having pain? Yes: NPRS scale: 5/10 Pain location: mid-low back Pain description: achy Aggravating factors: trying to exercise, walk, do stuff Relieving factors: heating pad  Are you having pain? Yes: NPRS scale: 9/10 Pain location: B knees  PRECAUTIONS: Fall  RED FLAGS: None   WEIGHT BEARING RESTRICTIONS: No  FALLS:  Has patient fallen in last 6 months? No  LIVING ENVIRONMENT: Lives with:  lives with a  friend who disabled  Lives in: Other trailer Stairs:  3 STE no rails but  uses a dolly for support when using steps  Has following equipment at home: Single point cane  OCCUPATION: on [employer]  PLOF: Independent with gait, Independent with transfers, and Requires assistive device for independence  PATIENT GOALS: "to decrease pain"  NEXT MD VISIT: not sure  OBJECTIVE:  Note: Objective measures were completed at Evaluation unless otherwise noted.  DIAGNOSTIC FINDINGS:  No recent imaging regarding back or neck  PATIENT SURVEYS:  Modified Oswestry 21/50 (moderate disability)  NDI 28/50 (severe disability)  COGNITION: Overall cognitive status: Within functional limits for tasks assessed     POSTURE: rounded shoulders, forward head, and posterior pelvic tilt  PALPATION: TTP in L cervical paraspinals, suboccipitals, levator, and UT; R side is mildly tender as compared to L side No TTP in central lumbar area but that is where she experiences pain  LUMBAR ROM:   AROM eval  Flexion WFL  Extension Limited to 25%, pain in mid-back  Right lateral flexion 50%  Left lateral flexion 50%  Right rotation 75%  Left rotation 75%   (Blank rows = not tested)   CERVICAL ROM:   Active ROM A/PROM (deg) eval  Flexion 12* (limited by pain)  Extension 9* (limited by pain)  Right lateral flexion 20* (limited by pain)  Left lateral flexion 20* (limited by pain)  Right rotation 27* (limited by pain)  Left rotation 30* (limited by pain)   (Blank rows = not tested)     TREATMENT:  TherAct For LTG assessment: CERVICAL ROM:   Active ROM A/PROM (deg) eval AROM (deg) 07/12/23  Flexion 12* (limited by pain) 30*  Extension 9* (limited by pain) 20* (painful down back R side of neck)  Right lateral flexion 20* (limited by pain) 24* (limited by pain)  Left lateral flexion 20* (limited by pain) 20* (limited by pain)  Right rotation 27* (limited by pain) 37* (painful)  Left rotation 30* (limited by pain) 45* (painful)   (Blank rows = not tested)    Oswestry:  22/50 NDI: 23/50 KOOS: 32% of normal function  Manual Therapy STM, TPR, and MFR to L cervical paraspinals and suboccipital region. Trigger points detected in L-side cervical muscles, relief of pain with manual therapy. Encouraged continued use of heating pad at home for pain relief as well as continuing with HEP.   PATIENT EDUCATION:  Education details: continue neck, back, and  knee HEP even when in pain as ROM can lubricate the joints; results of OM and functional implications, PT POC with plan to add 2 visits to POC Education method: Explanation Education comprehension: verbalized understanding and needs further education  HOME EXERCISE PROGRAM: Access Code: [email or web address] ([email or web address]) URL: [email or web address] Date: 05/03/2023 Prepared by: Nedra Hai   Exercises - Seated March with Resistance  - 1 x daily - 7 x weekly - 1 sets - 5 reps - 1 second  hold - Seated Small Alternating Straight Leg Lifts with Heel Touch  - 1 x daily - 7 x weekly - 1 sets - 5-10 reps - Supine Heel Slide  - 1 x daily - 7 x weekly - 1-2 sets - 10 reps - Supine Quad Set  - 1 x daily - 7 x weekly - 1-2 sets - 10 reps - 5 sec hold   Access Code: [email or web address] ([email or web address]) URL: [email or web address] Date: 05/31/2023 Prepared by: Peter Congo  Exercises - Seated Cervical Flexion AROM  - 1 x daily - 7 x weekly - 1 sets - 5-7 reps - 10-15 sec hold - Gentle Levator Scapulae Stretch  - 1 x daily - 7 x weekly - 1 sets - 5-7 reps - 10-15sec hold - Seated Gentle Upper Trapezius Stretch  - 1 x daily - 7 x weekly - 1 sets - 5-7 reps - 10-15 sec hold - Supine Cervical Sidebending  - 1 x daily - 7 x weekly - 1 sets - 3-5 reps - 15-30 sec hold - Supine Cervical Rotation AROM on Pillow  - 1 x daily - 7 x weekly - 1 sets - 3-5 reps - 15-30 sec hold - Supine Suboccipital Release with Tennis Balls  - 1 x daily - 7 x weekly - 1 sets - 1 reps - 5-10 min hold - Seated Cervical Traction  - 1 x daily - 7 x weekly -  1 sets - 3-5 reps - 30 sec hold   Access Code: [email or web address] (back) URL: [email or web address] Date: 06/07/2023 Prepared by: Peter Congo  Exercises - Supine Posterior Pelvic Tilt  - 1 x daily - 7 x weekly - 1 sets - 10 reps - 5 sec hold - Supine March  - 1 x daily - 7 x weekly - 3 sets - 10 reps - Supine Lower Trunk Rotation  - 1 x daily - 7 x weekly - 2-3 sets - 10 reps - Modified Thomas Stretch  - 1 x daily - 7 x weekly - 1 sets - 3 reps - 30 sec hold   ASSESSMENT:  CLINICAL IMPRESSION: Emphasis of skilled PT session on assessing LTG for recertification of PT services this date. Pt has met 2/4 LTG due to demonstrating improved function and decreased disability level based on her improved score on the NDI from 28/50 initially to 23/50 this date as well as improving her score on the KOOS to 32% function from 21% function upon initial assessment. She showed no improvement on the ODI, however sessions were mostly focused on addressing her cervical/neck pain and her knee pain as these have impacted her function more than her back pain has. Additionally, reassessed patient's cervical AROM and she has shown an increase in ROM and a decrease in pain in all directions except for lateral cervical flexion. Overall patient has shown an improvement in her cervical ROM and ability to manage her pain more independently. Patient would benefit from 2 more skilled PT sessions to finalize her HEP to ensure compliance with  management of her symptoms and improved function. Continue POC.   OBJECTIVE IMPAIRMENTS: Abnormal gait, decreased activity tolerance, decreased mobility, difficulty walking, decreased ROM, decreased strength, impaired perceived functional ability, improper body mechanics, postural dysfunction, and pain.   ACTIVITY LIMITATIONS: carrying, lifting, bending, sitting, standing, squatting, stairs, and transfers  PARTICIPATION LIMITATIONS: meal prep, cleaning, laundry, driving, and  community activity  PERSONAL FACTORS: Sex, Time since onset of injury/illness/exacerbation, and 1-2 comorbidities:    hypertension, prediabetes, mixed connective tissue disease, chronic back painare also affecting patient's functional outcome.   REHAB POTENTIAL: Fair time since onset, chronicity of pain, mixed connective tissue disorder  CLINICAL DECISION MAKING: Stable/uncomplicated  EVALUATION COMPLEXITY: Low   GOALS: Goals reviewed with patient? Yes   NEW SHORT TERM GOALS: Target date: 06/22/2023  Pt will be independent with initial HEP for improved strength, ROM, transfers and gait and management of her pain symptoms. Baseline: Goal status: MET   NEW LONG TERM GOALS: Target date: 07/13/2023  Pt will be independent with final HEP for improved strength, ROM, transfers and gait and management of her pain symptoms. Baseline:  Goal status: IN PROGRESS  2.  Pt will improve her score on the Oswestry to </= 16/50 to demonstrate decreased disability level. Baseline: 21/50 (12/30), 22/50 (2/10) Goal status: IN PROGRESS  3.  Pt will improve her score on the NDI to </= 23/50 to demonstrate decreased disability level. Baseline: 28/50 (12/30), 23/50 (2/10) Goal status: MET  4.  KOOS score to improve by at least 10 points to show improved QOL/positive subjective improvement  Baseline: not assessed at eval?, 21% of normal function/79% disability (1/13), 32% of normal function (2/10) Goal status: MET   NEW SHORT TERM GOALS=LONG TERM GOALS due to length of POC  NEW LONG TERM GOALS: Target date: 08/10/2023   Pt will be independent with final HEP for improved strength, ROM, transfers and gait and management of her pain symptoms. Baseline:  Goal status: IN PROGRESS  2.  Pt will improve her B lateral cervical flexion by >/= 10 degrees from initial assessment to demonstrate improved function Baseline: R: 20* (eval), 24* (2/10); L: 20* (eval), 20* (2/10) Goal status: IN  PROGRESS    PLAN:  PT FREQUENCY: 1-2x/week (pending patient's availability)  PT DURATION: 6 weeks+ 4 weeks (recert)  PLANNED INTERVENTIONS: 38756- PT Re-evaluation, 97110-Therapeutic exercises, 97530- Therapeutic activity, 97112- Neuromuscular re-education, 97535- Self Care, [id number]- Manual therapy, [id number]- Gait training, [phone]- Aquatic Therapy, [phone]- Electrical stimulation (manual), Patient/Family education, Balance training, Stair training, Taping, Dry Needling, Joint mobilization, Spinal mobilization, DME instructions, Cryotherapy, and Moist heat.  PLAN FOR NEXT SESSION: continue to alternate treating neck pain, back pain, and B knee pain; DOES NOT WANT DRY NEEDLING, SciFit, chair yoga   Peter Congo, PT Peter Congo, PT, DPT, CSRS  07/13/2023, 9:55 AM    For all possible CPT codes, reference the Planned Interventions line above.     Check all conditions that are expected to impact treatment: {Conditions expected to impact treatment:Presence of Medical Equipment   If treatment provided at initial evaluation, no treatment charged due to lack of authorization.

## 2023-07-20 ENCOUNTER — Ambulatory Visit: Payer: Medicaid Other | Admitting: Physical Therapy

## 2023-07-20 DIAGNOSIS — R262 Difficulty in walking, not elsewhere classified: Secondary | ICD-10-CM

## 2023-07-20 DIAGNOSIS — M5459 Other low back pain: Secondary | ICD-10-CM

## 2023-07-20 DIAGNOSIS — G8929 Other chronic pain: Secondary | ICD-10-CM

## 2023-07-20 DIAGNOSIS — M542 Cervicalgia: Secondary | ICD-10-CM

## 2023-07-20 DIAGNOSIS — M6281 Muscle weakness (generalized): Secondary | ICD-10-CM

## 2023-07-20 DIAGNOSIS — M25561 Pain in right knee: Secondary | ICD-10-CM | POA: Diagnosis not present

## 2023-07-20 DIAGNOSIS — R2681 Unsteadiness on feet: Secondary | ICD-10-CM
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
## 2023-07-23 ENCOUNTER — Other Ambulatory Visit: Payer: Self-pay

## 2023-07-23 DIAGNOSIS — M351 Other overlap syndromes: Secondary | ICD-10-CM

## 2023-07-23 DIAGNOSIS — Z79891 Long term (current) use of opiate analgesic: Secondary | ICD-10-CM

## 2023-07-25 ENCOUNTER — Other Ambulatory Visit (HOSPITAL_COMMUNITY): Payer: Self-pay

## 2023-07-25 MED ORDER — OXYCODONE-ACETAMINOPHEN 5-325 MG PO TABS
1.0000 | ORAL_TABLET | Freq: Two times a day (BID) | ORAL | 0 refills | Status: DC | PRN
  Filled 2023-07-25: qty 60, 30d supply, fill #0

## 2023-07-26 ENCOUNTER — Other Ambulatory Visit (HOSPITAL_COMMUNITY): Payer: Self-pay

## 2023-07-26 ENCOUNTER — Ambulatory Visit: Payer: Self-pay | Admitting: Physical Therapy

## 2023-07-26 ENCOUNTER — Other Ambulatory Visit: Payer: Self-pay

## 2023-07-26 NOTE — Therapy (Incomplete)
OUTPATIENT PHYSICAL THERAPY THORACOLUMBAR + CERVICAL + LOWER EXTREMITY TREATMENT - DISCHARGE NOTE***   Patient Name: Sonya Smith MRN: [id number] DOB:[birth year], 63 y.o., female Today's Date: 07/26/2023    PHYSICAL THERAPY DISCHARGE SUMMARY  Visits from Start of Care: ***  Current functional level related to goals / functional outcomes: ***   Remaining deficits: ***   Education / Equipment: ***   Patient agrees to discharge. Patient goals were {OP Goals:25702::"met"}. Patient is being discharged due to {OP Discharge Reasons:25703::"meeting the stated rehab goals."}    END OF SESSION:           Past Medical History:  Diagnosis Date   Anxiety 10/09/2010   Arthritis    of cervical and lumbar spine   Asthma    undiagnosed, was told by a physician in the ED, patient does not take any inhalers at home.    Chest pain    multiple ED visit, no objective evidence of cardiac or pulmonary causes  of chest pain in past   Hypertension 04/06/2019   Mixed connective tissue disease (HCC)    Restless leg syndrome 10/14/2017   Past Surgical History:  Procedure Laterality Date   drainage of peri tonsillar abscess     peri tonsillar abscess  10/11   drainage   Patient Active Problem List   Diagnosis Date Noted   Neck pain 05/18/2023   Bilateral knee pain 03/28/2023   Dental infection 08/17/2022   Obesity 08/17/2022   Pre-diabetes 08/17/2022   Chronic back pain 10/01/2020   Venous stasis of both lower extremities 01/19/2020   Hypertension 04/06/2019   Pleuritic chest pain 04/06/2019   Healthcare maintenance 10/14/2017   Chronic use of opiate drug for therapeutic purpose 06/10/2016   Mixed connective tissue disease (HCC) 01/07/2016    PCP: Morrie Sheldon, MD  REFERRING PROVIDER: Mercie Eon, MD  REFERRING DIAG: M54.89,G89.29 (ICD-10-CM) - Other chronic back pain  Rationale for Evaluation and Treatment: Rehabilitation  THERAPY DIAG:  No diagnosis  found.  ONSET DATE: 05/18/2023  SUBJECTIVE:                                                                                                                                                                                           SUBJECTIVE STATEMENT:  "Sonya Smith" or "Sonya Smith/Sonya Smith"  ***  Today's pain: Neck pain: 6/10 (worse on L side) Back pain: 5/10 Knee pain: 7-8/10, swollen   PERTINENT HISTORY:  past medical history of hypertension, prediabetes, mixed connective tissue disease, chronic back pain  PAIN:  Are you having pain? Yes: NPRS scale: 6 Pain location: back of neck and up back of her head Pain  description: chronic, continuous ache Aggravating factors: turning head side to side Relieving factors: heating pad   Are you having pain? Yes: NPRS scale: 5/10 Pain location: mid-low back Pain description: achy Aggravating factors: trying to exercise, walk, do stuff Relieving factors: heating pad  Are you having pain? Yes: NPRS scale: 7-8/10 Pain location: B knees  PRECAUTIONS: Fall  RED FLAGS: None   WEIGHT BEARING RESTRICTIONS: No  FALLS:  Has patient fallen in last 6 months? No  LIVING ENVIRONMENT: Lives with:  lives with a  friend who disabled  Lives in: Other trailer Stairs:  3 STE no rails but uses a dolly for support when using steps  Has following equipment at home: Single point cane  OCCUPATION: on [employer]  PLOF: Independent with gait, Independent with transfers, and Requires assistive device for independence  PATIENT GOALS: "to decrease pain"  NEXT MD VISIT: not sure  OBJECTIVE:  Note: Objective measures were completed at Evaluation unless otherwise noted.  DIAGNOSTIC FINDINGS:  No recent imaging regarding back or neck  PATIENT SURVEYS:  Modified Oswestry 21/50 (moderate disability)  NDI 28/50 (severe disability)  COGNITION: Overall cognitive status: Within functional limits for tasks assessed     POSTURE: rounded shoulders, forward head,  and posterior pelvic tilt  PALPATION: TTP in L cervical paraspinals, suboccipitals, levator, and UT; R side is mildly tender as compared to L side No TTP in central lumbar area but that is where she experiences pain  LUMBAR ROM:   AROM eval  Flexion WFL  Extension Limited to 25%, pain in mid-back  Right lateral flexion 50%  Left lateral flexion 50%  Right rotation 75%  Left rotation 75%   (Blank rows = not tested)   CERVICAL ROM:   Active ROM A/PROM (deg) eval  Flexion 12* (limited by pain)  Extension 9* (limited by pain)  Right lateral flexion 20* (limited by pain)  Left lateral flexion 20* (limited by pain)  Right rotation 27* (limited by pain)  Left rotation 30* (limited by pain)   (Blank rows = not tested)     TREATMENT:  TherAct ***  TherEx SciFit level 3 for 5 minutes using BUE/BLEs for neural priming for reciprocal movement, dynamic cardiovascular warmup and increased amplitude of stepping. ***    PATIENT EDUCATION:  Education details: continue neck, back, and knee HEP even when in pain as ROM can lubricate the joints; added to HEP; PT POC with plan to d/c next session*** Education method: Explanation, Demonstration, and Handouts Education comprehension: verbalized understanding, returned demonstration, and needs further education  HOME EXERCISE PROGRAM: Access Code: [email or web address] ([email or web address]) URL: [email or web address] Date: 05/03/2023 Prepared by: Nedra Hai   Exercises - Seated March with Resistance  - 1 x daily - 7 x weekly - 1 sets - 5 reps - 1 second  hold - Seated Small Alternating Straight Leg Lifts with Heel Touch  - 1 x daily - 7 x weekly - 1 sets - 5-10 reps - Supine Heel Slide  - 1 x daily - 7 x weekly - 1-2 sets - 10 reps - Supine Quad Set  - 1 x daily - 7 x weekly - 1-2 sets - 10 reps - 5 sec hold   Access Code: [email or web address] (neck) URL: [email or web address] Date: 05/31/2023 Prepared by: Peter Congo  Exercises - Seated Cervical Flexion AROM  - 1 x daily - 7 x weekly - 1 sets - 5-7 reps - 10-15 sec hold - Gentle Levator Scapulae Stretch  - 1 x daily -  7 x weekly - 1 sets - 5-7 reps - 10-15sec hold - Seated Gentle Upper Trapezius Stretch  - 1 x daily - 7 x weekly - 1 sets - 5-7 reps - 10-15 sec hold - Supine Cervical Sidebending  - 1 x daily - 7 x weekly - 1 sets - 3-5 reps - 15-30 sec hold - Supine Cervical Rotation AROM on Pillow  - 1 x daily - 7 x weekly - 1 sets - 3-5 reps - 15-30 sec hold - Supine Suboccipital Release with Tennis Balls  - 1 x daily - 7 x weekly - 1 sets - 1 reps - 5-10 min hold - Seated Cervical Traction  - 1 x daily - 7 x weekly - 1 sets - 3-5 reps - 30 sec hold   Access Code: [email or web address] (back) URL: [email or web address] Date: 06/07/2023 Prepared by: Peter Congo  Exercises - Supine Posterior Pelvic Tilt  - 1 x daily - 7 x weekly - 1 sets - 10 reps - 5 sec hold - Supine March  - 1 x daily - 7 x weekly - 3 sets - 10 reps - Supine Lower Trunk Rotation  - 1 x daily - 7 x weekly - 2-3 sets - 10 reps - Modified Thomas Stretch  - 1 x daily - 7 x weekly - 1 sets - 3 reps - 30 sec hold - Plank with Thoracic Rotation on Counter  - 1 x daily - 7 x weekly - 1 sets - 10 reps - 15 sec hold  Chair Yoga: -seated cat/cow -sun salutation with trunk rotation -high altar side leans -forward fold   ASSESSMENT:  CLINICAL IMPRESSION: Emphasis of skilled PT session***     OBJECTIVE IMPAIRMENTS: Abnormal gait, decreased activity tolerance, decreased mobility, difficulty walking, decreased ROM, decreased strength, impaired perceived functional ability, improper body mechanics, postural dysfunction, and pain.   ACTIVITY LIMITATIONS: carrying, lifting, bending, sitting, standing, squatting, stairs, and transfers  PARTICIPATION LIMITATIONS: meal prep, cleaning, laundry, driving, and community activity  PERSONAL FACTORS: Sex, Time since onset of  injury/illness/exacerbation, and 1-2 comorbidities:    hypertension, prediabetes, mixed connective tissue disease, chronic back painare also affecting patient's functional outcome.   REHAB POTENTIAL: Fair time since onset, chronicity of pain, mixed connective tissue disorder  CLINICAL DECISION MAKING: Stable/uncomplicated  EVALUATION COMPLEXITY: Low   GOALS: Goals reviewed with patient? Yes   NEW SHORT TERM GOALS: Target date: 06/22/2023  Pt will be independent with initial HEP for improved strength, ROM, transfers and gait and management of her pain symptoms. Baseline: Goal status: MET   NEW LONG TERM GOALS: Target date: 07/13/2023  Pt will be independent with final HEP for improved strength, ROM, transfers and gait and management of her pain symptoms. Baseline:  Goal status: IN PROGRESS  2.  Pt will improve her score on the Oswestry to </= 16/50 to demonstrate decreased disability level. Baseline: 21/50 (12/30), 22/50 (2/10) Goal status: IN PROGRESS  3.  Pt will improve her score on the NDI to </= 23/50 to demonstrate decreased disability level. Baseline: 28/50 (12/30), 23/50 (2/10) Goal status: MET  4.  KOOS score to improve by at least 10 points to show improved QOL/positive subjective improvement  Baseline: not assessed at eval?, 21% of normal function/79% disability (1/13), 32% of normal function (2/10) Goal status: MET   NEW SHORT TERM GOALS=LONG TERM GOALS due to length of POC  NEW LONG TERM GOALS: Target date: 08/10/2023***   Pt will be independent with final HEP for improved  strength, ROM, transfers and gait and management of her pain symptoms. Baseline:  Goal status: IN PROGRESS  2.  Pt will improve her B lateral cervical flexion by >/= 10 degrees from initial assessment to demonstrate improved function Baseline: R: 20* (eval), 24* (2/10); L: 20* (eval), 20* (2/10) Goal status: IN PROGRESS    PLAN:  PT FREQUENCY: 1-2x/week (pending patient's  availability)  PT DURATION: 6 weeks+ 4 weeks (recert)  PLANNED INTERVENTIONS: 86578- PT Re-evaluation, 97110-Therapeutic exercises, 97530- Therapeutic activity, 97112- Neuromuscular re-education, 97535- Self Care, [id number]- Manual therapy, [id number]- Gait training, [phone]- Aquatic Therapy, [phone]- Electrical stimulation (manual), Patient/Family education, Balance training, Stair training, Taping, Dry Needling, Joint mobilization, Spinal mobilization, DME instructions, Cryotherapy, and Moist heat.  PLAN FOR NEXT SESSION: assess LTG and d/c***   Peter Congo, PT Peter Congo, PT, DPT, CSRS  07/26/2023, 7:58 AM    For all possible CPT codes, reference the Planned Interventions line above.     Check all conditions that are expected to impact treatment: {Conditions expected to impact treatment:Presence of Medical Equipment   If treatment provided at initial evaluation, no treatment charged due to lack of authorization.

## 2023-08-02 ENCOUNTER — Encounter: Payer: Self-pay | Admitting: Internal Medicine

## 2023-08-02 ENCOUNTER — Ambulatory Visit: Payer: Medicaid Other | Attending: Internal Medicine | Admitting: Internal Medicine

## 2023-08-02 VITALS — BP 110/70 | HR 65 | Resp 16 | Ht 62.0 in | Wt 233.0 lb

## 2023-08-02 DIAGNOSIS — M25561 Pain in right knee: Secondary | ICD-10-CM | POA: Diagnosis not present

## 2023-08-02 DIAGNOSIS — G8929 Other chronic pain: Secondary | ICD-10-CM | POA: Diagnosis not present

## 2023-08-02 DIAGNOSIS — M180 Bilateral primary osteoarthritis of first carpometacarpal joints: Secondary | ICD-10-CM | POA: Diagnosis not present

## 2023-08-02 DIAGNOSIS — M25562 Pain in left knee: Secondary | ICD-10-CM | POA: Diagnosis not present

## 2023-08-02 DIAGNOSIS — M351 Other overlap syndromes: Secondary | ICD-10-CM

## 2023-08-02 NOTE — Patient Instructions (Signed)
 For osteoarthritis several treatments may be beneficial:  - Topical antiinflammatory medicine such as diclofenac or Voltaren can be applied to  affected area as needed. Topical analgesics containing CBD, menthol, or lidocaine can be tried.  - Oral nonsteroidal antiinflammatory drugs (NSAIDs) such as ibuprofen, aleve, celebrex, or mobic are usually helpful for osteoarthritis. These should be taken intermittently or as needed if possible.  - Turmeric has some antiinflammatory effect similar to NSAIDs and may help, if taken as a supplement should not be taken above recommended doses.   - Compressive gloves or sleeve can be helpful to support the joint especially if hurting or swelling with certain activities.  - Physical therapy referral can discuss exercises or activity modification to improve symptoms or strength if needed.  - Local steroid injection is an option if symptoms become worse and not controlled by the above options.

## 2023-08-23 ENCOUNTER — Other Ambulatory Visit (HOSPITAL_COMMUNITY): Payer: Self-pay

## 2023-08-23 ENCOUNTER — Other Ambulatory Visit: Payer: Self-pay | Admitting: Student

## 2023-08-23 DIAGNOSIS — M351 Other overlap syndromes: Secondary | ICD-10-CM

## 2023-08-23 DIAGNOSIS — Z79891 Long term (current) use of opiate analgesic: Secondary | ICD-10-CM

## 2023-08-24 ENCOUNTER — Other Ambulatory Visit (HOSPITAL_COMMUNITY): Payer: Self-pay

## 2023-08-24 MED ORDER — OXYCODONE-ACETAMINOPHEN 5-325 MG PO TABS
1.0000 | ORAL_TABLET | Freq: Two times a day (BID) | ORAL | 0 refills | Status: DC | PRN
  Filled 2023-08-24: qty 60, 30d supply, fill #0

## 2023-08-25 ENCOUNTER — Other Ambulatory Visit (HOSPITAL_COMMUNITY): Payer: Self-pay

## 2023-08-25 ENCOUNTER — Ambulatory Visit (INDEPENDENT_AMBULATORY_CARE_PROVIDER_SITE_OTHER): Admitting: Student

## 2023-08-25 VITALS — BP 130/74 | HR 57 | Temp 98.0°F | Ht 62.0 in | Wt 235.9 lb

## 2023-08-25 DIAGNOSIS — M549 Dorsalgia, unspecified: Secondary | ICD-10-CM | POA: Diagnosis present

## 2023-08-25 DIAGNOSIS — M351 Other overlap syndromes: Secondary | ICD-10-CM | POA: Diagnosis not present

## 2023-08-25 DIAGNOSIS — Z0000 Encounter for general adult medical examination without abnormal findings: Secondary | ICD-10-CM

## 2023-08-25 DIAGNOSIS — G8929 Other chronic pain: Secondary | ICD-10-CM | POA: Diagnosis not present

## 2023-08-25 DIAGNOSIS — R7303 Prediabetes: Secondary | ICD-10-CM | POA: Diagnosis not present

## 2023-08-25 MED ORDER — LIDOCAINE 4 % EX PTCH
1.0000 | MEDICATED_PATCH | CUTANEOUS | 3 refills | Status: DC
  Filled 2023-08-25: qty 30, 30d supply, fill #0

## 2023-08-25 NOTE — Progress Notes (Signed)
 CC: Mixed connective tissue disease  HPI: [name] is a 63 y.o. female living with a history stated below and presents today for mixed connective tissue disease. Please see problem based assessment and plan for additional details.  Past Medical History:  Diagnosis Date   Anxiety 10/09/2010   Arthritis    of cervical and lumbar spine   Asthma    undiagnosed, was told by a physician in the ED, patient does not take any inhalers at home.    Chest pain    multiple ED visit, no objective evidence of cardiac or pulmonary causes  of chest pain in past   Hypertension 04/06/2019   Mixed connective tissue disease (HCC)    Restless leg syndrome 10/14/2017    Current Outpatient Medications on File Prior to Visit  Medication Sig Dispense Refill   amLODipine (NORVASC) 5 MG tablet Take 1 tablet (5 mg total) by mouth daily. 30 tablet 11   aspirin EC 81 MG tablet Take 81 mg by mouth daily as needed for mild pain.     ibuprofen (ADVIL) 200 MG tablet Take 400 mg by mouth every 6 (six) hours as needed for moderate pain.     Multiple Vitamins-Minerals (MULTIVITAMIN WITH MINERALS) tablet Take 1 tablet by mouth daily.     olmesartan (BENICAR) 5 MG tablet Take 1 tablet (5 mg total) by mouth daily. 90 tablet 3   oxyCODONE-acetaminophen (PERCOCET/ROXICET) 5-325 MG tablet Take 1 tablet by mouth 2 (two) times daily as needed for severe pain (pain score 7-10). 60 tablet 0   No current facility-administered medications on file prior to visit.    Family History  Problem Relation Age of Onset   Diabetes Mother    Arthritis Mother    Hypertension Sister    Cancer Sister        Kidney   Other Brother        Burchetts   Stomach cancer Maternal Grandmother    Stroke Maternal Aunt        69s   Cancer Maternal Uncle        recurrent   Cancer Cousin    Stroke Cousin     Social History   Socioeconomic History   Marital status: Divorced    Spouse name: Not on file   Number of children: Not  on file   Years of education: Not on file   Highest education level: Not on file  Occupational History   Not on file  Tobacco Use   Smoking status: Former    Current packs/day: 0.00    Average packs/day: 1 pack/day for 30.0 years (30.0 ttl pk-yrs)    Types: Cigarettes    Start date: 10/08/1973    Quit date: 10/09/2003    Years since quitting: 19.8    Passive exposure: Never   Smokeless tobacco: Never  Vaping Use   Vaping status: Never Used  Substance and Sexual Activity   Alcohol use: No    Alcohol/week: 0.0 standard drinks of alcohol   Drug use: Yes    Types: Marijuana    Comment: occassional marijuana   Sexual activity: Not Currently  Other Topics Concern   Not on file  Social History Narrative   Lives in [place], [place] with her boy friend who is disabled. 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 She has a daughter who is independent and provides her money for medicines. 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  No health insurance.    Social Drivers of Corporate investment banker Strain: Low Risk  (08/17/2022)   Overall Financial Resource Strain (CARDIA)    Difficulty of Paying Living Expenses: Not hard at all  Food Insecurity: No Food Insecurity (08/17/2022)   Hunger Vital Sign    Worried About Running Out of Food in the Last Year: Never true    Ran Out of Food in the Last Year: Never true  Transportation Needs: No Transportation Needs (08/17/2022)   PRAPARE - Administrator, Civil Service (Medical): No    Lack of Transportation (Non-Medical): No  Physical Activity: Insufficiently Active (08/17/2022)   Exercise Vital Sign    Days of Exercise per Week: 7 days    Minutes of Exercise per Session: 10 min  Stress: No Stress Concern Present (08/17/2022)   [employer] of Occupational Health - Occupational Stress Questionnaire    Feeling of Stress : Not at all  Social Connections: Socially Isolated  (08/17/2022)   Social Connection and Isolation Panel [NHANES]    Frequency of Communication with Friends and Family: More than three times a week    Frequency of Social Gatherings with Friends and Family: Twice a week    Attends Religious Services: Never    Database administrator or Organizations: No    Attends Banker Meetings: Never    Marital Status: Widowed  Intimate Partner Violence: Not At Risk (08/17/2022)   Humiliation, Afraid, Rape, and Kick questionnaire    Fear of Current or Ex-Partner: No    Emotionally Abused: No    Physically Abused: No    Sexually Abused: No   Review of Systems: ROS negative except for what is noted on the assessment and plan.  Vitals:   08/25/23 1502  BP: 130/74  Pulse: (!) 57  Temp: 98 F (36.7 C)  TempSrc: Oral  SpO2: 95%  Weight: 235 lb 14.4 oz (107 kg)  Height: 5\' 2"  (1.575 m)    Physical Exam: Constitutional: well-appearing in no acute distress HENT: normocephalic atraumatic, mucous membranes moist Neck: supple Cardiovascular: regular rate and rhythm, no m/r/g Pulmonary/Chest: normal work of breathing on room air, lungs clear to auscultation bilaterally Abdominal: soft, non-tender, non-distended MSK: normal bulk and tone Neurological: alert & oriented x 3 Skin: warm and dry  Assessment & Plan:   Mixed connective tissue disease (HCC) Last seen in the clinic on 05/2023 for concomitant back pain and neck pain.  Initially thought to have been secondary to mixed connective tissue disease but may also have some component of osteoarthritis.  She is on chronic Percocet.  At that visit, she was started on Cymbalta and Celebrex, but this was continued from adverse effects making the patient feel dizzy.  She was also started on physical therapy which was highly effective.  She was seen by Dr. Dimple Casey and rheumatology recently who recommends continued conservative management with over-the-counter analgesics.  Will supplement current  medication regimen today with lidocaine patch. - Continue Percocet 5 mg BID PRN - Initiate lidocaine patch - Continue conservative treatment for breakthrough pain  Healthcare maintenance Patient notes that she does not currently have a gynecologist.  Discussed possibility receiving Pap smear at next visit in 3 months.  Pre-diabetes Last A1c was done 1 year ago was 5.7.  Will repeat today and also obtain lipid panel. - Follow-up A1c - Follow-up lipid panel  Patient discussed with Dr. Mariea Stable, MD  [facility] Internal Medicine, PGY-1 Date 08/25/2023 Time 3:46 PM

## 2023-08-25 NOTE — Patient Instructions (Signed)
 Thank you so much for coming to the clinic today!   I will follow-up regarding your labs.  Please start using the lidocaine patch on your back. You can change these out every 24 hours.   If you have any questions please feel free to the call the clinic at anytime at [phone]. It was a pleasure seeing you!  Best, Dr. Rayvon Char

## 2023-08-25 NOTE — Assessment & Plan Note (Signed)
 Last A1c was done 1 year ago was 5.7.  Will repeat today and also obtain lipid panel. - Follow-up A1c - Follow-up lipid panel

## 2023-08-25 NOTE — Assessment & Plan Note (Signed)
 Patient notes that she does not currently have a gynecologist.  Discussed possibility receiving Pap smear at next visit in 3 months.

## 2023-08-25 NOTE — Assessment & Plan Note (Addendum)
 Last seen in the clinic on 05/2023 for concomitant back pain and neck pain.  Initially thought to have been secondary to mixed connective tissue disease but may also have some component of osteoarthritis.  She is on chronic Percocet.  At that visit, she was started on Cymbalta and Celebrex, but this was continued from adverse effects making the patient feel dizzy.  She was also started on physical therapy which was highly effective.  She was seen by Dr. Dimple Casey and rheumatology recently who recommends continued conservative management with over-the-counter analgesics.  Will supplement current medication regimen today with lidocaine patch. - Continue Percocet 5 mg BID PRN - Initiate lidocaine patch - Continue conservative treatment for breakthrough pain

## 2023-08-26 LAB — LIPID PANEL
Chol/HDL Ratio: 5 ratio — ABNORMAL HIGH (ref 0.0–4.4)
Cholesterol, Total: 205 mg/dL — ABNORMAL HIGH (ref 100–199)
HDL: 41 mg/dL (ref >39)
LDL Chol Calc (NIH): 121 mg/dL — ABNORMAL HIGH (ref 0–99)
Triglycerides: 246 mg/dL — ABNORMAL HIGH (ref 0–149)
VLDL Cholesterol Cal: 43 mg/dL — ABNORMAL HIGH (ref 5–40)

## 2023-08-26 LAB — HEMOGLOBIN A1C
Est. average glucose Bld gHb Est-mCnc: 134 mg/dL
Hgb A1c MFr Bld: 6.3 % — ABNORMAL HIGH (ref 4.8–5.6)

## 2023-08-26 NOTE — Progress Notes (Signed)
 Internal Medicine Clinic Attending  Case discussed with the resident at the time of the visit.  We reviewed the resident's history and exam and pertinent patient test results.  I agree with the assessment, diagnosis, and plan of care documented in the resident's note.

## 2023-09-23 ENCOUNTER — Other Ambulatory Visit (HOSPITAL_COMMUNITY): Payer: Self-pay

## 2023-09-23 ENCOUNTER — Other Ambulatory Visit: Payer: Self-pay | Admitting: Student

## 2023-09-23 DIAGNOSIS — Z79891 Long term (current) use of opiate analgesic: Secondary | ICD-10-CM

## 2023-09-23 DIAGNOSIS — M351 Other overlap syndromes: Secondary | ICD-10-CM

## 2023-09-23 MED ORDER — OXYCODONE-ACETAMINOPHEN 5-325 MG PO TABS
1.0000 | ORAL_TABLET | Freq: Two times a day (BID) | ORAL | 0 refills | Status: DC | PRN
  Filled 2023-09-23: qty 60, 30d supply, fill #0

## 2023-09-23 NOTE — Telephone Encounter (Signed)
 Copied from CRM [id number]. Topic: Clinical - Medication Refill >> Sep 23, 2023 10:16 AM Arlie Benedict B wrote: Most Recent Primary Care Visit:  Provider: Maxie Spaniel  Department: [facility]  Visit Type: OPEN ESTABLISHED  Date: 08/25/2023  Medication: xyCODONE-acetaminophen  (PERCOCET/ROXICET) 5-325 MG tablet  Has the patient contacted their pharmacy? Yes (Agent: If yes, when and what did the pharmacy advise?)Patient called and said they were sending request for refill via fax   Is this the correct pharmacy for this prescription? Yes If no, delete pharmacy and type the correct one.  This is the patient's preferred pharmacy:  [facility] [place] [facility] [address] [place] [address] Phone: [phone] Fax: [phone]   Has the prescription been filled recently? Yes 08/24/23  Is the patient out of the medication? Yes  Has the patient been seen for an appointment in the last year OR does the patient have an upcoming appointment? Yes  Can we respond through MyChart? No  Agent: Please be advised that Rx refills may take up to 3 business days. We ask that you follow-up with your pharmacy.

## 2023-09-24 ENCOUNTER — Other Ambulatory Visit (HOSPITAL_COMMUNITY): Payer: Self-pay

## 2023-10-21 ENCOUNTER — Other Ambulatory Visit: Payer: Self-pay | Admitting: Student

## 2023-10-21 ENCOUNTER — Other Ambulatory Visit (HOSPITAL_COMMUNITY): Payer: Self-pay

## 2023-10-21 DIAGNOSIS — Z79891 Long term (current) use of opiate analgesic: Secondary | ICD-10-CM

## 2023-10-21 DIAGNOSIS — M351 Other overlap syndromes: Secondary | ICD-10-CM

## 2023-10-21 NOTE — Telephone Encounter (Signed)
 Copied from CRM [id number]. Topic: Clinical - Medication Refill >> Oct 21, 2023  9:21 AM Retta Caster wrote: Medication: oxyCODONE -acetaminophen  (PERCOCET/ROXICET) 5-325 MG tablet   Has the patient contacted their pharmacy? Yes (Agent: If no, request that the patient contact the pharmacy for the refill. If patient does not wish to contact the pharmacy document the reason why and proceed with request.) (Agent: If yes, when and what did the pharmacy advise?)  This is the patient's preferred pharmacy:  [place] [facility] [address] [place] [address] Phone: [phone] Fax: [phone]  Is this the correct pharmacy for this prescription? Yes If no, delete pharmacy and type the correct one.   Has the prescription been filled recently? Yes  Is the patient out of the medication? No 23 pills left  Has the patient been seen for an appointment in the last year OR does the patient have an upcoming appointment? Yes  Can we respond through MyChart? Yes  Agent: Please be advised that Rx refills may take up to 3 business days. We ask that you follow-up with your pharmacy.

## 2023-10-21 NOTE — Telephone Encounter (Signed)
 Last office visit: 08/24/2024 Last UDS:08/17/2022 Last Refill: recently dispensed 09/24/23 #60 Next appt: none scheduled

## 2023-10-22 ENCOUNTER — Other Ambulatory Visit: Payer: Self-pay | Admitting: Student

## 2023-10-22 ENCOUNTER — Other Ambulatory Visit (HOSPITAL_COMMUNITY): Payer: Self-pay

## 2023-10-22 DIAGNOSIS — Z79891 Long term (current) use of opiate analgesic: Secondary | ICD-10-CM

## 2023-10-22 DIAGNOSIS — M351 Other overlap syndromes: Secondary | ICD-10-CM

## 2023-10-23 ENCOUNTER — Other Ambulatory Visit (HOSPITAL_COMMUNITY): Payer: Self-pay

## 2023-10-23 ENCOUNTER — Other Ambulatory Visit: Payer: Self-pay | Admitting: Student

## 2023-10-23 DIAGNOSIS — M351 Other overlap syndromes: Secondary | ICD-10-CM

## 2023-10-23 DIAGNOSIS — Z79891 Long term (current) use of opiate analgesic: Secondary | ICD-10-CM

## 2023-10-23 MED ORDER — OXYCODONE-ACETAMINOPHEN 5-325 MG PO TABS
1.0000 | ORAL_TABLET | Freq: Two times a day (BID) | ORAL | 0 refills | Status: DC | PRN
  Filled 2023-10-23: qty 60, 30d supply, fill #0

## 2023-10-23 NOTE — Progress Notes (Signed)
 Received after hours page. Returned call to patient and confirmed DOB. Patient states request for Percocet refill and concern due to holiday weekend and offices/pharmacies closed. Reviewed PDMP with last fill on 09/23/23 and appears appropriate.  Refilled Percocet 5-325 mg BID PRN for 60 tablets, 0 refills to pharmacy Pharmacist, hospital at [facility]). Patient verbalizes understanding of plan and all questions addressed.

## 2023-10-26 ENCOUNTER — Telehealth: Payer: Self-pay | Admitting: *Deleted

## 2023-10-26 NOTE — Telephone Encounter (Signed)
 Copied from CRM [id number]. Topic: Clinical - Medication Refill >> Oct 21, 2023  9:21 AM Retta Caster wrote: Medication: oxyCODONE -acetaminophen  (PERCOCET/ROXICET) 5-325 MG tablet   Has the patient contacted their pharmacy? Yes (Agent: If no, request that the patient contact the pharmacy for the refill. If patient does not wish to contact the pharmacy document the reason why and proceed with request.) (Agent: If yes, when and what did the pharmacy advise?)  This is the patient's preferred pharmacy:  [place] [facility] [address] [place] [address] Phone: [phone] Fax: [phone]  Is this the correct pharmacy for this prescription? Yes If no, delete pharmacy and type the correct one.   Has the prescription been filled recently? Yes  Is the patient out of the medication? No 23 pills left  Has the patient been seen for an appointment in the last year OR does the patient have an upcoming appointment? Yes  Can we respond through MyChart? Yes  Agent: Please be advised that Rx refills may take up to 3 business days. We ask that you follow-up with your pharmacy. >> Oct 22, 2023 12:48 PM Danelle Dunning F wrote: Patient has called in to check for an update on her prescription refill as she cannot wait the allocated 3 business days due to being out of medication after today. Due to the extended weekend patient is hoping someone on the clinical staff can assist her with getting a refill placed with her preferred pharmacy (listed above) today. Please call patient back with any clarification needed.   Callback Number: [phone]

## 2023-10-26 NOTE — Telephone Encounter (Signed)
 Oxycodone  was refilled on 5/24by Dr Jari Merles.

## 2023-11-09 ENCOUNTER — Encounter: Payer: Self-pay | Admitting: *Deleted

## 2023-11-19 ENCOUNTER — Other Ambulatory Visit: Payer: Self-pay | Admitting: Student

## 2023-11-19 DIAGNOSIS — M351 Other overlap syndromes: Secondary | ICD-10-CM

## 2023-11-19 DIAGNOSIS — Z79891 Long term (current) use of opiate analgesic: Secondary | ICD-10-CM

## 2023-11-19 NOTE — Telephone Encounter (Signed)
 Copied from CRM [id number]. Topic: Clinical - Medication Refill >> Nov 19, 2023 11:03 AM Carrielelia G wrote: Medication: oxyCODONE -acetaminophen  (PERCOCET/ROXICET) 5-325 MG tablet  Has the patient contacted their pharmacy? No (Agent: If no, request that the patient contact the pharmacy for the refill. If patient does not wish to contact the pharmacy document the reason why and proceed with request.) (Agent: If yes, when and what did the pharmacy advise?)  This is the patient's preferred pharmacy:  [place] [facility] [address] [place] [address] Phone: [phone] Fax: [phone]  Is this the correct pharmacy for this prescription? Yes If no, delete pharmacy and type the correct one.    Is the patient out of the medication? No ( 4 days left)   Has the patient been seen for an appointment in the last year OR does the patient have an upcoming appointment? Yes  Can we respond through MyChart? No  Agent: Please be advised that Rx refills may take up to 3 business days. We ask that you follow-up with your pharmacy.

## 2023-11-22 ENCOUNTER — Other Ambulatory Visit: Payer: Self-pay | Admitting: Student

## 2023-11-22 ENCOUNTER — Other Ambulatory Visit (HOSPITAL_COMMUNITY): Payer: Self-pay

## 2023-11-22 DIAGNOSIS — Z79891 Long term (current) use of opiate analgesic: Secondary | ICD-10-CM

## 2023-11-22 DIAGNOSIS — M351 Other overlap syndromes: Secondary | ICD-10-CM

## 2023-11-23 ENCOUNTER — Telehealth: Payer: Self-pay | Admitting: *Deleted

## 2023-11-23 ENCOUNTER — Other Ambulatory Visit (HOSPITAL_COMMUNITY): Payer: Self-pay

## 2023-11-23 ENCOUNTER — Other Ambulatory Visit: Payer: Self-pay | Admitting: Student

## 2023-11-23 DIAGNOSIS — Z79891 Long term (current) use of opiate analgesic: Secondary | ICD-10-CM

## 2023-11-23 DIAGNOSIS — M351 Other overlap syndromes: Secondary | ICD-10-CM

## 2023-11-23 MED ORDER — OXYCODONE-ACETAMINOPHEN 5-325 MG PO TABS
1.0000 | ORAL_TABLET | Freq: Two times a day (BID) | ORAL | 0 refills | Status: DC | PRN
  Filled 2023-11-23: qty 60, 30d supply, fill #0

## 2023-11-23 NOTE — Telephone Encounter (Signed)
 Call to patient informed her that the prescription refill has been sent to the [facility] as she had requested. Copied from CRM [id number]. Topic: Clinical - Medication Refill >> Nov 19, 2023 11:03 AM Carrielelia G wrote: Medication: oxyCODONE -acetaminophen  (PERCOCET/ROXICET) 5-325 MG tablet  Has the patient contacted their pharmacy? No (Agent: If no, request that the patient contact the pharmacy for the refill. If patient does not wish to contact the pharmacy document the reason why and proceed with request.) (Agent: If yes, when and what did the pharmacy advise?)  This is the patient's preferred pharmacy:  [place] [facility] [address] [place] [address] Phone: [phone] Fax: [phone]  Is this the correct pharmacy for this prescription? Yes If no, delete pharmacy and type the correct one.    Is the patient out of the medication? No ( 4 days left)   Has the patient been seen for an appointment in the last year OR does the patient have an upcoming appointment? Yes  Can we respond through MyChart? No  Agent: Please be advised that Rx refills may take up to 3 business days. We ask that you follow-up with your pharmacy. >> Nov 23, 2023 11:23 AM Zane F wrote: Patient called in for an update on the listed prescription refill; Patient was informed of the 3 business day waiting period; Patient informed agent that she is completely out of the medication and wants the office to be aware that the prescription is needed as soon as possible.  Callback Number: [phone]

## 2023-11-24 ENCOUNTER — Other Ambulatory Visit (HOSPITAL_COMMUNITY): Payer: Self-pay

## 2023-12-01 ENCOUNTER — Other Ambulatory Visit (HOSPITAL_COMMUNITY): Payer: Self-pay

## 2023-12-19 NOTE — Progress Notes (Unsigned)
 CC: Cough  HPI:  [name] is a 63 y.o. female living with a history stated below and presents today for cough that started about 3 weeks ago after flulike symptoms. Please see problem based assessment and plan for additional details.  Past Medical History:  Diagnosis Date   Anxiety 10/09/2010   Arthritis    of cervical and lumbar spine   Asthma    undiagnosed, was told by a physician in the ED, patient does not take any inhalers at home.    Chest pain    multiple ED visit, no objective evidence of cardiac or pulmonary causes  of chest pain in past   Hypertension 04/06/2019   Mixed connective tissue disease (HCC)    Restless leg syndrome 10/14/2017    Current Outpatient Medications on File Prior to Visit  Medication Sig Dispense Refill   aspirin  EC 81 MG tablet Take 81 mg by mouth daily as needed for mild pain.     ibuprofen  (ADVIL ) 200 MG tablet Take 400 mg by mouth every 6 (six) hours as needed for moderate pain.     Multiple Vitamins-Minerals (MULTIVITAMIN WITH MINERALS) tablet Take 1 tablet by mouth daily.     oxyCODONE -acetaminophen  (PERCOCET/ROXICET) 5-325 MG tablet Take 1 tablet by mouth 2 (two) times daily as needed for severe pain (pain score 7-10). 60 tablet 0   No current facility-administered medications on file prior to visit.    Family History  Problem Relation Age of Onset   Diabetes Mother    Arthritis Mother    Hypertension Sister    Cancer Sister        Kidney   Other Brother        Burchetts   Stomach cancer Maternal Grandmother    Stroke Maternal Aunt        8s   Cancer Maternal Uncle        recurrent   Cancer Cousin    Stroke Cousin     Social History   Socioeconomic History   Marital status: Divorced    Spouse name: Not on file   Number of children: Not on file   Years of education: Not on file   Highest education level: Not on file  Occupational History   Not on file  Tobacco Use   Smoking status: Former    Current  packs/day: 0.00    Average packs/day: 1 pack/day for 30.0 years (30.0 ttl pk-yrs)    Types: Cigarettes    Start date: 10/08/1973    Quit date: 10/09/2003    Years since quitting: 20.2    Passive exposure: Never   Smokeless tobacco: Never  Vaping Use   Vaping status: Never Used  Substance and Sexual Activity   Alcohol use: No    Alcohol/week: 0.0 standard drinks of alcohol   Drug use: Yes    Types: Marijuana    Comment: occassional marijuana   Sexual activity: Not Currently  Other Topics Concern   Not on file  Social History Narrative   Lives in [place], [place] with her boy friend who is disabled. 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 She has a daughter who is independent and provides her money for medicines. 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  No health insurance.    Social Drivers of Health   Financial Resource Strain: Low Risk  (08/17/2022)   Overall Financial Resource Strain (CARDIA)  Difficulty of Paying Living Expenses: Not hard at all  Food Insecurity: No Food Insecurity (08/17/2022)   Hunger Vital Sign    Worried About Running Out of Food in the Last Year: Never true    Ran Out of Food in the Last Year: Never true  Transportation Needs: No Transportation Needs (08/17/2022)   PRAPARE - Administrator, Civil Service (Medical): No    Lack of Transportation (Non-Medical): No  Physical Activity: Insufficiently Active (08/17/2022)   Exercise Vital Sign    Days of Exercise per Week: 7 days    Minutes of Exercise per Session: 10 min  Stress: No Stress Concern Present (08/17/2022)   [employer] of Occupational Health - Occupational Stress Questionnaire    Feeling of Stress : Not at all  Social Connections: Socially Isolated (08/17/2022)   Social Connection and Isolation Panel    Frequency of Communication with Friends and Family: More than three times a week    Frequency of Social Gatherings  with Friends and Family: Twice a week    Attends Religious Services: Never    Database administrator or Organizations: No    Attends Banker Meetings: Never    Marital Status: Widowed  Intimate Partner Violence: Not At Risk (08/17/2022)   Humiliation, Afraid, Rape, and Kick questionnaire    Fear of Current or Ex-Partner: No    Emotionally Abused: No    Physically Abused: No    Sexually Abused: No    Review of Systems: ROS negative except for what is noted on the assessment and plan.  Vitals:   12/20/23 1413  BP: 115/60  Pulse: 65  Temp: 97.9 F (36.6 C)  TempSrc: Oral  SpO2: 97%  Weight: 227 lb 3.2 oz (103.1 kg)  Height: 5' 5 (1.651 m)    Physical Exam  Physical Exam: Constitutional: well-appearing in no acute distress HENT: normocephalic atraumatic, mucous membranes moist Eyes: conjunctiva non-erythematous Neck: supple Cardiovascular: regular rate and rhythm, no m/r/g Pulmonary/Chest: normal work of breathing on room air, lungs clear to auscultation bilaterally Abdominal: soft, non-tender, non-distended Neurological: alert & oriented x 3  Assessment & Plan:   Hypertension BP Readings from Last 3 Encounters:  12/20/23 115/60  08/25/23 130/74  08/02/23 110/70  Blood pressure remains well-controlled. The patient denies chest pain, headache, or vision changes. Continue current medications: amlodipine  5 mg and olmesartan  5 mg daily. Check BMP and Magnesium   Post-viral cough syndrome The patient presents with a cough that began approximately 3 weeks ago following a viral upper respiratory infection. She denies fever, shortness of breath, or other systemic symptoms. The cough is productive but has gradually improved. No fevers reported during the illness course.  On physical exam, lungs are clear to auscultation bilaterally, and there is no sinus tenderness.  Assessment: post-viral cough with upper airway involvement and possible postnasal  drip.  Plan:  Start Flonase (fluticasone) nasal spray daily Mucinex  twice a day Recommend saline nasal rinse Return if symptoms worsen or persist beyond 2 more weeks    Patient seen with Dr. Lovie Armando Rossetti M.D [facility] Internal Medicine, PGY-1 Phone: [phone] Date 12/20/2023 Time 4:17 PM

## 2023-12-20 ENCOUNTER — Other Ambulatory Visit: Payer: Self-pay

## 2023-12-20 ENCOUNTER — Ambulatory Visit: Payer: Self-pay

## 2023-12-20 ENCOUNTER — Other Ambulatory Visit (HOSPITAL_COMMUNITY): Payer: Self-pay

## 2023-12-20 VITALS — BP 115/60 | HR 65 | Temp 97.9°F | Ht 65.0 in | Wt 227.2 lb

## 2023-12-20 DIAGNOSIS — I10 Essential (primary) hypertension: Secondary | ICD-10-CM | POA: Diagnosis present

## 2023-12-20 DIAGNOSIS — R058 Other specified cough: Secondary | ICD-10-CM

## 2023-12-20 MED ORDER — OLMESARTAN MEDOXOMIL 5 MG PO TABS
5.0000 mg | ORAL_TABLET | Freq: Every day | ORAL | 3 refills | Status: AC
  Filled 2023-12-20: qty 90, 90d supply, fill #0
  Filled 2024-03-24: qty 90, 90d supply, fill #1
  Filled 2024-06-29: qty 30, 30d supply, fill #2

## 2023-12-20 MED ORDER — AMLODIPINE BESYLATE 5 MG PO TABS
5.0000 mg | ORAL_TABLET | Freq: Every day | ORAL | 11 refills | Status: AC
  Filled 2023-12-20: qty 30, 30d supply, fill #0
  Filled 2024-01-22: qty 30, 30d supply, fill #1
  Filled 2024-02-24: qty 30, 30d supply, fill #2
  Filled 2024-03-24: qty 30, 30d supply, fill #3
  Filled 2024-04-24: qty 30, 30d supply, fill #4
  Filled 2024-05-26: qty 30, 30d supply, fill #5
  Filled 2024-06-29: qty 30, 30d supply, fill #6

## 2023-12-20 MED ORDER — LIDOCAINE 5 % EX PTCH
1.0000 | MEDICATED_PATCH | CUTANEOUS | 0 refills | Status: AC
  Filled 2023-12-20: qty 10, 10d supply, fill #0

## 2023-12-20 MED ORDER — GUAIFENESIN ER 600 MG PO TB12
600.0000 mg | ORAL_TABLET | Freq: Two times a day (BID) | ORAL | 1 refills | Status: DC
  Filled 2023-12-20: qty 40, 20d supply, fill #0

## 2023-12-20 NOTE — Patient Instructions (Addendum)
   Thank you, Ms. Levora, for allowing us  to provide your care today.  Today we addressed your persistent cough, which appears to be residual from your recent cold, consistent with post-viral (or post-flu) symptoms. We have sent a prescription tablet for you. In addition, please continue using your Flonase and perform saline nasal rinses at home to help relieve your symptoms.  Regarding your hypertension, we have ordered lab work to monitor your condition appropriately.  It was a pleasure seeing you today. Please don't hesitate to reach out if you have any questions or concerns.  I have ordered the following labs for you:   Lab Orders         Basic metabolic panel with GFR         Magnesium        Referrals ordered today:   Referral Orders  No referral(s) requested today     I have ordered the following medication/changed the following medications:   Stop the following medications: Medications Discontinued During This Encounter  Medication Reason   lidocaine  (HM LIDOCAINE  PATCH) 4 % Duplicate     Start the following medications: Meds ordered this encounter  Medications   guaiFENesin  (MUCINEX ) 600 MG 12 hr tablet    Sig: Take 1 tablet (600 mg total) by mouth 2 (two) times daily.    Dispense:  30 tablet    Refill:  1   lidocaine  (LIDODERM ) 5 %    Sig: Place 1 patch onto the skin daily for 10 days. Remove & Discard patch within 12 hours or as directed by MD    Dispense:  10 patch    Refill:  0     Follow up: PRN   Remember:   Should you have any questions or concerns please call the internal medicine clinic at [phone].    Armando Rossetti, M.D [facility]

## 2023-12-20 NOTE — Assessment & Plan Note (Signed)
 The patient presents with a cough that began approximately 3 weeks ago following a viral upper respiratory infection. She denies fever, shortness of breath, or other systemic symptoms. The cough is productive but has gradually improved. No fevers reported during the illness course.  On physical exam, lungs are clear to auscultation bilaterally, and there is no sinus tenderness.  Assessment: post-viral cough with upper airway involvement and possible postnasal drip.  Plan:  Start Flonase (fluticasone) nasal spray daily Mucinex  twice a day Recommend saline nasal rinse Return if symptoms worsen or persist beyond 2 more weeks

## 2023-12-20 NOTE — Assessment & Plan Note (Addendum)
 BP Readings from Last 3 Encounters:  12/20/23 115/60  08/25/23 130/74  08/02/23 110/70  Blood pressure remains well-controlled. The patient denies chest pain, headache, or vision changes. Continue current medications: amlodipine  5 mg and olmesartan  5 mg daily. Check BMP and Magnesium

## 2023-12-21 LAB — BASIC METABOLIC PANEL WITH GFR
BUN/Creatinine Ratio: 22 (ref 12–28)
BUN: 16 mg/dL (ref 8–27)
CO2: 22 mmol/L (ref 20–29)
Calcium: 9.4 mg/dL (ref 8.7–10.3)
Chloride: 104 mmol/L (ref 96–106)
Creatinine, Ser: 0.72 mg/dL (ref 0.57–1.00)
Glucose: 97 mg/dL (ref 70–99)
Potassium: 4.5 mmol/L (ref 3.5–5.2)
Sodium: 140 mmol/L (ref 134–144)
eGFR: 96 mL/min/1.73 (ref >59)

## 2023-12-21 LAB — MAGNESIUM: Magnesium: 2.1 mg/dL (ref 1.6–2.3)

## 2023-12-21 NOTE — Progress Notes (Signed)
 Internal Medicine Clinic Attending  I was physically present during the key portions of the resident provided service and participated in the medical decision making of patient's management care. I reviewed pertinent patient test results.  The assessment, diagnosis, and plan were formulated together and I agree with the documentation in the resident's note.  Lovie Clarity, MD   I agree with starting OTC guaifenesin  & Flonase for post-nasal drip and post-URI cough

## 2023-12-22 ENCOUNTER — Ambulatory Visit: Payer: Self-pay

## 2023-12-23 ENCOUNTER — Other Ambulatory Visit (HOSPITAL_COMMUNITY): Payer: Self-pay

## 2023-12-23 ENCOUNTER — Other Ambulatory Visit: Payer: Self-pay | Admitting: Student

## 2023-12-23 DIAGNOSIS — Z79891 Long term (current) use of opiate analgesic: Secondary | ICD-10-CM

## 2023-12-23 DIAGNOSIS — M351 Other overlap syndromes: Secondary | ICD-10-CM

## 2023-12-23 NOTE — Telephone Encounter (Unsigned)
 Copied from CRM #[id number]. Topic: Clinical - Prescription Issue >> Dec 23, 2023 11:02 AM Carrielelia G wrote: Reason for CRM: patient called and stated she has received all her other medication but not her oxyCODONE -acetaminophen  (PERCOCET/ROXICET) 5-325 MG tablet.  Please advise.

## 2023-12-24 ENCOUNTER — Telehealth: Payer: Self-pay

## 2023-12-24 ENCOUNTER — Other Ambulatory Visit: Payer: Self-pay

## 2023-12-24 ENCOUNTER — Telehealth: Payer: Self-pay | Admitting: *Deleted

## 2023-12-24 ENCOUNTER — Other Ambulatory Visit (HOSPITAL_COMMUNITY): Payer: Self-pay

## 2023-12-24 DIAGNOSIS — Z79891 Long term (current) use of opiate analgesic: Secondary | ICD-10-CM

## 2023-12-24 DIAGNOSIS — M351 Other overlap syndromes: Secondary | ICD-10-CM

## 2023-12-24 MED ORDER — OXYCODONE-ACETAMINOPHEN 5-325 MG PO TABS
1.0000 | ORAL_TABLET | Freq: Two times a day (BID) | ORAL | 0 refills | Status: DC | PRN
  Filled 2023-12-24: qty 60, 30d supply, fill #0

## 2023-12-24 NOTE — Telephone Encounter (Signed)
 Refill request has been sent to the [facility].   Copied from CRM [id number]. Topic: Clinical - Prescription Issue >> Dec 24, 2023 10:33 AM Sonya Smith wrote: Reason for CRM: Patient called about oxyCODONE -acetaminophen  (PERCOCET/ROXICET) 5-325 MG tablet it  is still pending and she is pain, she was hoping to pick it up today

## 2023-12-24 NOTE — Telephone Encounter (Signed)
 Copied from CRM #[id number]. Topic: Clinical - Medication Refill >> Dec 24, 2023 10:31 AM Adrianna P wrote: Medication: oxyCODONE -acetaminophen  (PERCOCET/ROXICET) 5-325 MG tablet   Has the patient contacted their pharmacy? Yes (Agent: If no, request that the patient contact the pharmacy for the refill. If patient does not wish to contact the pharmacy document the reason why and proceed with request.) (Agent: If yes, when and what did the pharmacy advise?)  This is the patient's preferred pharmacy:  [place] - [facility] [address] [place] [address] Phone: [phone] Fax: [phone]  Is this the correct pharmacy for this prescription? Yes If no, delete pharmacy and type the correct one.   Has the prescription been filled recently? No  Is the patient out of the medication? Yes  Has the patient been seen for an appointment in the last year OR does the patient have an upcoming appointment? Yes  Can we respond through MyChart? No  Agent: Please be advised that Rx refills may take up to 3 business days. We ask that you follow-up with your pharmacy.

## 2023-12-25 ENCOUNTER — Other Ambulatory Visit (HOSPITAL_COMMUNITY): Payer: Self-pay

## 2023-12-31 ENCOUNTER — Other Ambulatory Visit: Payer: Self-pay

## 2024-01-18 ENCOUNTER — Other Ambulatory Visit: Payer: Self-pay

## 2024-01-18 ENCOUNTER — Emergency Department (HOSPITAL_COMMUNITY)
Admission: EM | Admit: 2024-01-18 | Discharge: 2024-01-18 | Disposition: A | Discharge status: Home / Self Care | Attending: Emergency Medicine | Admitting: Emergency Medicine

## 2024-01-18 ENCOUNTER — Emergency Department (HOSPITAL_COMMUNITY)

## 2024-01-18 ENCOUNTER — Encounter (HOSPITAL_COMMUNITY): Payer: Self-pay

## 2024-01-18 DIAGNOSIS — M545 Low back pain, unspecified: Secondary | ICD-10-CM | POA: Insufficient documentation

## 2024-01-18 DIAGNOSIS — Z7982 Long term (current) use of aspirin: Secondary | ICD-10-CM | POA: Diagnosis not present

## 2024-01-18 DIAGNOSIS — G8929 Other chronic pain: Secondary | ICD-10-CM

## 2024-01-18 LAB — TROPONIN I (HIGH SENSITIVITY): Troponin I (High Sensitivity): 2 ng/L (ref <18)

## 2024-01-18 MED ORDER — LIDOCAINE 5 % EX PTCH
1.0000 | MEDICATED_PATCH | CUTANEOUS | Status: DC
  Administered 2024-01-18: 1 via TRANSDERMAL
  Filled 2024-01-18: qty 1

## 2024-01-18 MED ORDER — DIAZEPAM 2 MG PO TABS
2.0000 mg | ORAL_TABLET | Freq: Four times a day (QID) | ORAL | 0 refills | Status: AC | PRN
  Filled 2024-01-18: qty 15, 4d supply, fill #0

## 2024-01-18 MED ORDER — DIAZEPAM 5 MG PO TABS
10.0000 mg | ORAL_TABLET | Freq: Once | ORAL | Status: AC
  Administered 2024-01-18: 10 mg via ORAL
  Filled 2024-01-18: qty 2

## 2024-01-18 MED ORDER — OXYCODONE-ACETAMINOPHEN 5-325 MG PO TABS
2.0000 | ORAL_TABLET | Freq: Once | ORAL | Status: AC
  Administered 2024-01-18: 2 via ORAL
  Filled 2024-01-18: qty 2

## 2024-01-18 NOTE — ED Triage Notes (Signed)
 Pt reports with lower back pain x 3 days that is not getting better. Pt states that it make it hard for her to walk.

## 2024-01-18 NOTE — ED Provider Notes (Signed)
 [place] EMERGENCY DEPARTMENT AT [facility] Provider Note   CSN: [id number] Arrival date & time: 01/18/24  1454     Patient presents with: Back Pain   Sonya Smith is a 63 y.o. female.   63 year old female presents with lower back pain x 3 days.  History of chronic back pain.  Denies any history of trauma recently.  No bowel or bladder dysfunction.  Pain starts in low back goes down her legs at times.  States that has trouble getting out of her chair.  Is in pain management currently at this time.  No foot drop when walking.  Does use a cane at times at baseline.  Also notes intermittent left-sided chest discomfort which she says is been on for several weeks.  It waxes and wanes and last from seconds to minutes.  No associated diaphoresis or dyspnea.       Prior to Admission medications   Medication Sig Start Date End Date Taking? Authorizing Provider  amLODipine  (NORVASC ) 5 MG tablet Take 1 tablet (5 mg total) by mouth daily. 12/20/23 12/19/24  Bernadine Manos, MD  aspirin  EC 81 MG tablet Take 81 mg by mouth daily as needed for mild pain.    [provider]  guaiFENesin  (MUCINEX ) 600 MG 12 hr tablet Take 1 tablet (600 mg total) by mouth 2 (two) times daily. 12/20/23   Azadegan, Maryam, MD  ibuprofen  (ADVIL ) 200 MG tablet Take 400 mg by mouth every 6 (six) hours as needed for moderate pain.    [provider]  Multiple Vitamins-Minerals (MULTIVITAMIN WITH MINERALS) tablet Take 1 tablet by mouth daily.    [provider]  olmesartan  (BENICAR ) 5 MG tablet Take 1 tablet (5 mg total) by mouth daily. 12/20/23   Azadegan, Maryam, MD  oxyCODONE -acetaminophen  (PERCOCET/ROXICET) 5-325 MG tablet Take 1 tablet by mouth 2 (two) times daily as needed for severe pain (pain score 7-10). 12/24/23   Renne Homans, MD    Allergies: Meloxicam , Codeine, and Darvocet [propoxyphene n-acetaminophen ]    Review of Systems  All other systems reviewed and are  negative.   Updated Vital Signs BP (!) 141/58   Pulse 68   Temp 97.6 F (36.4 C) (Oral)   Resp 18   LMP 07/07/2015   SpO2 94%   Physical Exam Vitals and nursing note reviewed.  Constitutional:      General: She is not in acute distress.    Appearance: Normal appearance. She is well-developed. She is not toxic-appearing.  HENT:     Head: Normocephalic and atraumatic.  Eyes:     General: Lids are normal.     Conjunctiva/sclera: Conjunctivae normal.     Pupils: Pupils are equal, round, and reactive to light.  Neck:     Thyroid : No thyroid  mass.     Trachea: No tracheal deviation.  Cardiovascular:     Rate and Rhythm: Normal rate and regular rhythm.     Heart sounds: Normal heart sounds. No murmur heard.    No gallop.  Pulmonary:     Effort: Pulmonary effort is normal. No respiratory distress.     Breath sounds: Normal breath sounds. No stridor. No decreased breath sounds, wheezing, rhonchi or rales.  Abdominal:     General: There is no distension.     Palpations: Abdomen is soft.     Tenderness: There is no abdominal tenderness. There is no rebound.  Musculoskeletal:        General: No tenderness. Normal range of motion.  Cervical back: Normal range of motion and neck supple.       Back:  Skin:    General: Skin is warm and dry.     Findings: No abrasion or rash.  Neurological:     Mental Status: She is alert and oriented to person, place, and time. Mental status is at baseline.     GCS: GCS eye subscore is 4. GCS verbal subscore is 5. GCS motor subscore is 6.     Cranial Nerves: No cranial nerve deficit.     Sensory: No sensory deficit.     Motor: Motor function is intact.     Gait: Gait is intact.     Comments: Strength is 5 of 5 bilateral lower extremities  Psychiatric:        Attention and Perception: Attention normal.        Speech: Speech normal.        Behavior: Behavior normal.     (all labs ordered are listed, but only abnormal results are  displayed) Labs Reviewed - No data to display  EKG: EKG Interpretation Date/Time:  Tuesday January 18 2024 17:50:45 EDT Ventricular Rate:  55 PR Interval:  150 QRS Duration:  104 QT Interval:  436 QTC Calculation: 417 R Axis:   78  Text Interpretation: Sinus rhythm No significant change since last tracing Confirmed by Dasie Faden ([id number]) on 01/18/2024 7:11:32 PM  Radiology: No results found.   Procedures   Medications Ordered in the ED  lidocaine  (LIDODERM ) 5 % 1 patch (has no administration in time range)  diazepam  (VALIUM ) tablet 10 mg (has no administration in time range)  oxyCODONE -acetaminophen  (PERCOCET/ROXICET) 5-325 MG per tablet 2 tablet (has no administration in time range)                                    Medical Decision Making Amount and/or Complexity of Data Reviewed Radiology: ordered. ECG/medicine tests: ordered.  Risk Prescription drug management.   Patient is EKG shows normal sinus rhythm.  Chest x-ray without acute findings.  Lumbar spine series negative.  Troponin negative.  Patient treated here and feels better.  Will discharge home and she will follow with her pain management.  Will add a muscle relaxants     Final diagnoses:  None    ED Discharge Orders     None          Dasie Faden, MD 01/18/24 1946

## 2024-01-19 ENCOUNTER — Other Ambulatory Visit: Payer: Self-pay

## 2024-01-19 ENCOUNTER — Other Ambulatory Visit (HOSPITAL_COMMUNITY): Payer: Self-pay

## 2024-01-20 ENCOUNTER — Telehealth: Payer: Self-pay | Admitting: *Deleted

## 2024-01-20 ENCOUNTER — Other Ambulatory Visit: Payer: Self-pay

## 2024-01-20 DIAGNOSIS — M351 Other overlap syndromes: Secondary | ICD-10-CM

## 2024-01-20 DIAGNOSIS — Z79891 Long term (current) use of opiate analgesic: Secondary | ICD-10-CM

## 2024-01-20 NOTE — Telephone Encounter (Signed)
 Copied from CRM #[id number]. Topic: Clinical - Medication Refill >> Jan 20, 2024  1:07 PM Mercer PEDLAR wrote: Medication: oxyCODONE -acetaminophen  (PERCOCET/ROXICET) 5-325 MG tablet    Has the patient contacted their pharmacy? Yes (Agent: If no, request that the patient contact the pharmacy for the refill. If patient does not wish to contact the pharmacy document the reason why and proceed with request.) (Agent: If yes, when and what did the pharmacy advise?)  This is the patient's preferred pharmacy:  [place] - [facility] [address] [place] [address] Phone: [phone] Fax: [phone]  Is this the correct pharmacy for this prescription? Yes If no, delete pharmacy and type the correct one.   Has the prescription been filled recently? No  Is the patient out of the medication? No  Has the patient been seen for an appointment in the last year OR does the patient have an upcoming appointment? Yes  Can we respond through MyChart? No  Agent: Please be advised that Rx refills may take up to 3 business days. We ask that you follow-up with your pharmacy.   ----------------------------------------------------------------------- From previous Reason for Contact - Scheduling: Patient/patient representative is calling to schedule an appointment. Refer to attachments for appointment information.

## 2024-01-20 NOTE — Telephone Encounter (Signed)
 Copied from CRM #[id number]. Topic: General - Other >> Jan 20, 2024  1:09 PM Mercer PEDLAR wrote: Reason for CRM: Patient wanted to notify PCP that she went to hospital on Tuesday 01/18/24 for back pain and was started on diazepam  (VALIUM ) 2 MG tablet. She stated that she will callback to schedule a hospital follow up appointment when her back is feeling better.

## 2024-01-22 ENCOUNTER — Other Ambulatory Visit (HOSPITAL_COMMUNITY): Payer: Self-pay

## 2024-01-24 ENCOUNTER — Other Ambulatory Visit (HOSPITAL_COMMUNITY): Payer: Self-pay

## 2024-01-24 ENCOUNTER — Telehealth: Payer: Self-pay | Admitting: Student

## 2024-01-24 DIAGNOSIS — M351 Other overlap syndromes: Secondary | ICD-10-CM

## 2024-01-24 DIAGNOSIS — Z79891 Long term (current) use of opiate analgesic: Secondary | ICD-10-CM

## 2024-01-24 MED ORDER — OXYCODONE-ACETAMINOPHEN 5-325 MG PO TABS
1.0000 | ORAL_TABLET | Freq: Two times a day (BID) | ORAL | 0 refills | Status: DC | PRN
  Filled 2024-01-24: qty 60, 30d supply, fill #0

## 2024-01-24 NOTE — Telephone Encounter (Signed)
 Oxycodone  was refilled today 8/25.

## 2024-01-24 NOTE — Telephone Encounter (Signed)
 Copied from CRM [id number]. Topic: Clinical - Medication Refill >> Jan 24, 2024  8:25 AM Cherylann RAMAN wrote: Medication: oxyCODONE -acetaminophen  (PERCOCET/ROXICET) 5-325 MG tablet   Has the patient contacted their pharmacy? Yes (Agent: If no, request that the patient contact the pharmacy for the refill. If patient does not wish to contact the pharmacy document the reason why and proceed with request.) (Agent: If yes, when and what did the pharmacy advise?)  This is the patient's preferred pharmacy:  [place] - [facility] [address] [place] [address] Phone: [phone] Fax: [phone]  Is this the correct pharmacy for this prescription? Yes If no, delete pharmacy and type the correct one.   Has the prescription been filled recently? No  Is the patient out of the medication? Yes  Has the patient been seen for an appointment in the last year OR does the patient have an upcoming appointment? Yes  Can we respond through MyChart? Yes  Agent: Please be advised that Rx refills may take up to 3 business days. We ask that you follow-up with your pharmacy.

## 2024-02-22 ENCOUNTER — Other Ambulatory Visit: Payer: Self-pay

## 2024-02-22 DIAGNOSIS — M351 Other overlap syndromes: Secondary | ICD-10-CM

## 2024-02-22 DIAGNOSIS — Z79891 Long term (current) use of opiate analgesic: Secondary | ICD-10-CM

## 2024-02-22 NOTE — Telephone Encounter (Signed)
 Copied from CRM #[id number]. Topic: Clinical - Medication Refill >> Feb 22, 2024 11:48 AM Merlynn A wrote: Medication: oxyCODONE -acetaminophen  (PERCOCET/ROXICET) 5-325 MG tablet  Has the patient contacted their pharmacy? No (Agent: If no, request that the patient contact the pharmacy for the refill. If patient does not wish to contact the pharmacy document the reason why and proceed with request.) (Agent: If yes, when and what did the pharmacy advise?)  This is the patient's preferred pharmacy:  [place] [facility] [address] [place] [address] Phone: [phone] Fax: [phone]  Is this the correct pharmacy for this prescription? Yes If no, delete pharmacy and type the correct one.   Has the prescription been filled recently? No  Is the patient out of the medication? NO  Has the patient been seen for an appointment in the last year OR does the patient have an upcoming appointment? Yes  Can we respond through MyChart? Yes  Agent: Please be advised that Rx refills may take up to 3 business days. We ask that you follow-up with your pharmacy.

## 2024-02-23 ENCOUNTER — Other Ambulatory Visit (HOSPITAL_COMMUNITY): Payer: Self-pay

## 2024-02-23 MED ORDER — OXYCODONE-ACETAMINOPHEN 5-325 MG PO TABS
1.0000 | ORAL_TABLET | Freq: Two times a day (BID) | ORAL | 0 refills | Status: DC | PRN
  Filled 2024-02-23: qty 60, 30d supply, fill #0

## 2024-02-24 ENCOUNTER — Other Ambulatory Visit (HOSPITAL_COMMUNITY): Payer: Self-pay

## 2024-03-21 NOTE — Telephone Encounter (Signed)
 Copied from CRM #[id number]. Topic: Clinical - Medication Refill >> Mar 21, 2024 11:22 AM Merlynn A wrote: Medication: oxyCODONE -acetaminophen  (PERCOCET/ROXICET) 5-325 MG tablet   Has the patient contacted their pharmacy? No (Agent: If no, request that the patient contact the pharmacy for the refill. If patient does not wish to contact the pharmacy document the reason why and proceed with request.) (Agent: If yes, when and what did the pharmacy advise?)  This is the patient's preferred pharmacy:  [place] - [facility] [address] [place] [address] Phone: [phone] Fax: [phone]  Is this the correct pharmacy for this prescription? Yes If no, delete pharmacy and type the correct one.   Has the prescription been filled recently? No  Is the patient out of the medication? No  Has the patient been seen for an appointment in the last year OR does the patient have an upcoming appointment? Yes  Can we respond through MyChart? Yes  Agent: Please be advised that Rx refills may take up to 3 business days. We ask that you follow-up with your pharmacy.

## 2024-03-24 ENCOUNTER — Other Ambulatory Visit: Payer: Self-pay | Admitting: Student

## 2024-03-24 ENCOUNTER — Ambulatory Visit: Payer: Self-pay

## 2024-03-24 ENCOUNTER — Other Ambulatory Visit (HOSPITAL_COMMUNITY): Payer: Self-pay

## 2024-03-24 DIAGNOSIS — Z79891 Long term (current) use of opiate analgesic: Secondary | ICD-10-CM

## 2024-03-24 DIAGNOSIS — M351 Other overlap syndromes: Secondary | ICD-10-CM

## 2024-03-24 NOTE — Telephone Encounter (Signed)
 FYI Only or Action Required?: Action required by provider: Med Request.  Patient was last seen in primary care on 12/20/2023 by Bernadine Manos, MD.  Called Nurse Triage reporting Medication Refill.  Symptoms began Chronic.  Interventions attempted: Prescription medications: Oxycodone -acetaminophen .  Symptoms are: stable.  Triage Disposition: Home Care  Patient/caregiver understands and will follow disposition?: Yes  Reason for Disposition  [1] Prescription prescribed recently is not at pharmacy AND [2] triager has access to patient's EMR AND [3] prescription is recorded in the EMR  Health information question, no triage required and triager able to answer question  Answer Assessment - Initial Assessment Questions 1. REASON FOR CALL: What is the main reason for your call? or How can I best help you?     Patient calling in for oxyCODONE -acetaminophen  (PERCOCET/ROXICET) 5-325 MG tablet , stated she called at beginning of week so it'd be ready   2. SYMPTOMS : Do you have any symptoms?      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 DRUG NAME: What medicine do you need to have refilled?     OxyCODONE -aceptaminphen 5-325 MG tablet  2. PRESCRIBER: Who prescribed it? Note: The prescribing doctor or group is responsible for refill approvals.SABRA     PCP  3. PHARMACY: Have you contacted your pharmacy (drugstore)? Note: Some pharmacies will contact the doctor (or NP/PA).      [place]  4. SYMPTOMS: Do you have any symptoms?     Chronic pain, auto immune disease  Protocols used: Information Only Call - No Triage-A-AH, Medication Refill and Renewal Call-A-AH  Copied from CRM [id number]. Topic: Clinical - Prescription Issue >> Mar 24, 2024 12:41 PM Debby BROCKS wrote: Reason for CRM: Patient has run out of oxyCODONE -acetaminophen  (PERCOCET/ROXICET) 5-325 MG tablet and requested a refill  on 03/21/2024 (CRM # [id number]) it has yet to be authorized and she would like to know what she can do for the weekend to get more

## 2024-03-25 ENCOUNTER — Other Ambulatory Visit (HOSPITAL_COMMUNITY): Payer: Self-pay

## 2024-03-27 ENCOUNTER — Ambulatory Visit: Payer: Self-pay

## 2024-03-27 ENCOUNTER — Other Ambulatory Visit (HOSPITAL_COMMUNITY): Payer: Self-pay

## 2024-03-27 MED ORDER — OXYCODONE-ACETAMINOPHEN 5-325 MG PO TABS
1.0000 | ORAL_TABLET | Freq: Two times a day (BID) | ORAL | 0 refills | Status: DC | PRN
  Filled 2024-03-27: qty 60, 30d supply, fill #0

## 2024-03-27 NOTE — Telephone Encounter (Signed)
 FYI Only or Action Required?: Action required by provider: medication refill request.  Patient was last seen in primary care on 12/20/2023 by Bernadine Manos, MD.  Called Nurse Triage reporting No chief complaint on file.. Symptoms began yesterday.  Interventions attempted: Nothing.  Symptoms are: unchanged.  Triage Disposition: Call PCP Now  Patient/caregiver understands and will follow disposition?: Yes  Copied from CRM [id number]. Topic: Clinical - Red Word Triage >> Mar 27, 2024  9:33 AM Mercer PEDLAR wrote: Red Word that prompted transfer to Nurse Triage: In a lot of pain due to being out of oxyCODONE -acetaminophen  (PERCOCET/ROXICET) 5-325 MG tablet. She stated that she has been waiting for refill since 03/21/24. Reason for Disposition  [1] Prescription refill request for ESSENTIAL medicine (i.e., likelihood of harm to patient if not taken) AND [2] triager unable to refill per department policy  Answer Assessment - Initial Assessment Questions 1. DRUG NAME: What medicine do you need to have refilled?     Oxycodone   2. REFILLS REMAINING: How many refills are remaining? Notes: The label on the medicine or pill bottle will show how many refills are remaining. If there are no refills remaining, then a renewal may be needed.     None  3. EXPIRATION DATE: What is the expiration date? Note: The label states when the prescription will expire, and thus can no longer be refilled.)     Unsure  4. PRESCRIBER: Who prescribed it? Note: The prescribing doctor or group is responsible for refill approvals.SABRA     Unsure, [facility]  5. PHARMACY: Have you contacted your pharmacy (drugstore)? Note: Some pharmacies will contact the doctor (or NP/PA).      Yes  6. SYMPTOMS: Do you have any symptoms?     Yes, Pain  7. PREGNANCY: Is there any chance that you are pregnant? When was your last menstrual period?     No and No  Protocols used: Medication Refill and Renewal  Call-A-AH

## 2024-03-27 NOTE — Telephone Encounter (Signed)
 Call to patient informed her that a prescription has been sent to her Pharmacy.

## 2024-04-24 ENCOUNTER — Other Ambulatory Visit (HOSPITAL_COMMUNITY): Payer: Self-pay

## 2024-04-24 ENCOUNTER — Telehealth: Payer: Self-pay

## 2024-04-24 ENCOUNTER — Other Ambulatory Visit: Payer: Self-pay | Admitting: Student

## 2024-04-24 DIAGNOSIS — M351 Other overlap syndromes: Secondary | ICD-10-CM

## 2024-04-24 DIAGNOSIS — Z79891 Long term (current) use of opiate analgesic: Secondary | ICD-10-CM

## 2024-04-24 NOTE — Telephone Encounter (Signed)
 Copied from CRM [id number]. Topic: Clinical - Medication Refill >> Apr 24, 2024  8:48 AM Carrielelia G wrote: Medication: oxyCODONE -acetaminophen  (PERCOCET/ROXICET) 5-325 MG tablet  Has the patient contacted their pharmacy? No (Agent: If no, request that the patient contact the pharmacy for the refill. If patient does not wish to contact the pharmacy document the reason why and proceed with request.) (Agent: If yes, when and what did the pharmacy advise?)  This is the patient's preferred pharmacy:  [place] [facility] [address] [place] [address] Phone: [phone] Fax: [phone]  Is this the correct pharmacy for this prescription? Yes If no, delete pharmacy and type the correct one.    Is the patient out of the medication? No  Has the patient been seen for an appointment in the last year OR does the patient have an upcoming appointment? Yes  Can we respond through MyChart? No  Agent: Please be advised that Rx refills may take up to 3 business days. We ask that you follow-up with your pharmacy.

## 2024-04-24 NOTE — Telephone Encounter (Signed)
 Duplicate request

## 2024-04-25 ENCOUNTER — Other Ambulatory Visit (HOSPITAL_COMMUNITY): Payer: Self-pay

## 2024-04-28 ENCOUNTER — Other Ambulatory Visit: Payer: Self-pay | Admitting: Student

## 2024-04-28 ENCOUNTER — Other Ambulatory Visit (HOSPITAL_COMMUNITY): Payer: Self-pay

## 2024-04-28 DIAGNOSIS — Z79891 Long term (current) use of opiate analgesic: Secondary | ICD-10-CM

## 2024-04-28 DIAGNOSIS — M351 Other overlap syndromes: Secondary | ICD-10-CM

## 2024-04-28 MED ORDER — OXYCODONE-ACETAMINOPHEN 5-325 MG PO TABS
1.0000 | ORAL_TABLET | Freq: Two times a day (BID) | ORAL | 0 refills | Status: DC | PRN
  Filled 2024-04-28: qty 60, 30d supply, fill #0

## 2024-04-28 NOTE — Progress Notes (Signed)
 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 PDMP review last refill was late October, so a refill is appropriate. Sending 60 tabs with 0 refills to [facility], plan discussed with Ms Noland.

## 2024-05-11 ENCOUNTER — Encounter (HOSPITAL_COMMUNITY): Payer: Self-pay

## 2024-05-11 ENCOUNTER — Ambulatory Visit (HOSPITAL_COMMUNITY)
Admission: EM | Admit: 2024-05-11 | Discharge: 2024-05-11 | Disposition: A | Discharge status: Home / Self Care | Attending: Nurse Practitioner | Admitting: Nurse Practitioner

## 2024-05-11 ENCOUNTER — Other Ambulatory Visit (HOSPITAL_COMMUNITY): Payer: Self-pay

## 2024-05-11 ENCOUNTER — Ambulatory Visit (HOSPITAL_COMMUNITY)

## 2024-05-11 ENCOUNTER — Telehealth: Payer: Self-pay | Admitting: *Deleted

## 2024-05-11 DIAGNOSIS — R051 Acute cough: Secondary | ICD-10-CM

## 2024-05-11 DIAGNOSIS — J209 Acute bronchitis, unspecified: Secondary | ICD-10-CM | POA: Diagnosis not present

## 2024-05-11 MED ORDER — BENZONATATE 100 MG PO CAPS
100.0000 mg | ORAL_CAPSULE | Freq: Three times a day (TID) | ORAL | 0 refills | Status: AC | PRN
  Filled 2024-05-11: qty 21, 7d supply, fill #0

## 2024-05-11 MED ORDER — ALBUTEROL SULFATE (2.5 MG/3ML) 0.083% IN NEBU
2.5000 mg | INHALATION_SOLUTION | Freq: Once | RESPIRATORY_TRACT | Status: AC
  Administered 2024-05-11: 2.5 mg via RESPIRATORY_TRACT

## 2024-05-11 MED ORDER — ALBUTEROL SULFATE (2.5 MG/3ML) 0.083% IN NEBU
INHALATION_SOLUTION | RESPIRATORY_TRACT | Status: AC
  Filled 2024-05-11: qty 3

## 2024-05-11 MED ORDER — GUAIFENESIN ER 600 MG PO TB12
600.0000 mg | ORAL_TABLET | Freq: Two times a day (BID) | ORAL | 0 refills | Status: AC
  Filled 2024-05-11: qty 30, 15d supply, fill #0

## 2024-05-11 MED ORDER — ALBUTEROL SULFATE HFA 108 (90 BASE) MCG/ACT IN AERS
1.0000 | INHALATION_SPRAY | Freq: Four times a day (QID) | RESPIRATORY_TRACT | 0 refills | Status: AC | PRN
  Filled 2024-05-11: qty 6.7, 30d supply, fill #0

## 2024-05-11 MED ORDER — AZITHROMYCIN 250 MG PO TABS
ORAL_TABLET | ORAL | 0 refills | Status: AC
  Filled 2024-05-11: qty 6, 5d supply, fill #0

## 2024-05-11 MED FILL — Prednisone Tab 20 MG: 40.0000 mg | ORAL | 5 days supply | Qty: 10 | Fill #0 | Status: AC

## 2024-05-11 NOTE — Telephone Encounter (Signed)
 Walk-in. Requesting to be seen, c/o sore throat, productive cough (green to white),sneezing,running nose and sob. Requesting an abx. I talked to Dr Lanie Chimera, - no available appts unless she's willing to wait for any no-shows (1545 PM appts) or go to UC/ED. Pt  was informed ; decided not to wait. Per chart, pt went to UC.

## 2024-05-11 NOTE — ED Provider Notes (Signed)
 [facility]    CSN: [id number] Arrival date & time: 05/11/24  1526      History   Chief Complaint Chief Complaint  Patient presents with   Shortness of Breath    HPI Sonya Smith is a 63 y.o. female.   Patient presents today with 8-day history of congested cough, shortness of breath, chest pain and tightness, runny and stuffy nose, decreased appetite, and fatigue.  She also endorses fever and sore throat that have now improved.  No abdominal pain, nausea/vomiting, or diarrhea.  Has not had any improvement at home.  Reports history of smoking, quit smoking 20 years ago.  Reports she has needed an albuterol inhaler in the past when she was sick years ago.    Past Medical History:  Diagnosis Date   Anxiety 10/09/2010   Arthritis    of cervical and lumbar spine   Asthma    undiagnosed, was told by a physician in the ED, patient does not take any inhalers at home.    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 08/02/2023   Neck pain 05/18/2023   Bilateral knee pain 03/28/2023   Dental infection 08/17/2022   Obesity 08/17/2022   Pre-diabetes 08/17/2022   Chronic back pain 10/01/2020   Venous stasis of both lower extremities 01/19/2020   Hypertension 04/06/2019   Pleuritic chest pain 04/06/2019   Healthcare maintenance 10/14/2017   Chronic use of opiate drug for therapeutic purpose 06/10/2016   Mixed connective tissue disease 01/07/2016    Past Surgical History:  Procedure Laterality Date   drainage of peri tonsillar abscess     peri tonsillar abscess  10/11   drainage    OB History   No obstetric history on file.      Home  Medications    Prior to Admission medications  Medication Sig Start Date End Date Taking? Authorizing Provider  albuterol (VENTOLIN HFA) 108 (90 Base) MCG/ACT inhaler Inhale 1-2 puffs into the lungs every 6 (six) hours as needed for wheezing or shortness of breath. 05/11/24  Yes Chandra Raisin A, NP  amLODipine  (NORVASC ) 5 MG tablet Take 1 tablet (5 mg total) by mouth daily. 12/20/23 12/19/24 Yes Azadegan, Maryam, MD  aspirin  EC 81 MG tablet Take 81 mg by mouth daily as needed for mild pain.   Yes [provider]  azithromycin  (ZITHROMAX ) 250 MG tablet Take 2 tablets by mouth on day 1, then take 1 tablet by mouth on days 2-5. 05/11/24  Yes Chandra Raisin LABOR, NP  benzonatate (TESSALON) 100 MG capsule Take 1 capsule (100 mg total) by mouth 3 (three) times daily as needed for cough. Do not take with alcohol or while operating or driving heavy machinery 87/88/74  Yes Chandra Raisin A, NP  diazepam  (VALIUM ) 2 MG tablet Take 1 tablet (2 mg total) by mouth every 6 (six) hours as needed for muscle spasms. 01/18/24  Yes Dasie Faden, MD  ibuprofen  (ADVIL ) 200 MG tablet Take 400 mg by mouth every 6 (six) hours as needed for moderate pain.   Yes [provider]  Multiple Vitamins-Minerals (MULTIVITAMIN WITH MINERALS) tablet Take 1 tablet by mouth daily.  Yes [provider]  olmesartan  (BENICAR ) 5 MG tablet Take 1 tablet (5 mg total) by mouth daily. 12/20/23  Yes Azadegan, Maryam, MD  oxyCODONE -acetaminophen  (PERCOCET/ROXICET) 5-325 MG tablet Take 1 tablet by mouth 2 (two) times daily as needed for severe pain (pain score 7-10). 04/28/24  Yes Juberg, Lonni, DO  predniSONE (DELTASONE) 20 MG tablet Take 2 tablets (40 mg total) by mouth daily with breakfast for 5 days. 05/11/24 05/16/24 Yes Chandra Harlene LABOR, NP  guaiFENesin  (MUCINEX ) 600 MG 12 hr tablet Take 1 tablet (600 mg total) by mouth 2 (two) times daily. 05/11/24   Chandra Harlene LABOR, NP    Family  History Family History  Problem Relation Age of Onset   Diabetes Mother    Arthritis Mother    Hypertension Sister    Cancer Sister        Kidney   Other Brother        [place]   Stomach cancer Maternal Grandmother    Stroke Maternal Aunt        70s   Cancer Maternal Uncle        recurrent   Cancer Cousin    Stroke Cousin     Social History Social History[1]   Allergies   Meloxicam , Codeine, and Darvocet [propoxyphene n-acetaminophen ]   Review of Systems Review of Systems Per HPI  Physical Exam Triage Vital Signs ED Triage Vitals  Encounter Vitals Group     BP 05/11/24 1536 (!) 140/73     Girls Systolic BP Percentile --      Girls Diastolic BP Percentile --      Boys Systolic BP Percentile --      Boys Diastolic BP Percentile --      Pulse Rate 05/11/24 1536 76     Resp 05/11/24 1536 (!) 28     Temp 05/11/24 1536 98.1 F (36.7 C)     Temp Source 05/11/24 1536 Oral     SpO2 05/11/24 1536 92 %     Weight --      Height 05/11/24 1542 5' 5 (1.651 m)     Head Circumference --      Peak Flow --      Pain Score 05/11/24 1540 7     Pain Loc --      Pain Education --      Exclude from Growth Chart --    No data found.  Updated Vital Signs BP (!) 140/73 (BP Location: Right Arm)   Pulse 78   Temp 98.1 F (36.7 C) (Oral)   Resp 20   Ht 5' 5 (1.651 m)   LMP 07/07/2015   SpO2 98%   BMI 37.81 kg/m   Visual Acuity Right Eye Distance:   Left Eye Distance:   Bilateral Distance:    Right Eye Near:   Left Eye Near:    Bilateral Near:     Physical Exam Vitals and nursing note reviewed.  Constitutional:      General: She is not in acute distress.    Appearance: Normal appearance. She is not ill-appearing or toxic-appearing.  HENT:     Head: Normocephalic and atraumatic.     Right Ear: Tympanic membrane, ear canal and external ear normal.     Left Ear: Tympanic membrane, ear canal and external ear normal.     Nose: Congestion present. No rhinorrhea.      Mouth/Throat:     Mouth: Mucous membranes are moist.     Pharynx: Oropharynx is  clear. Posterior oropharyngeal erythema present. No oropharyngeal exudate.  Eyes:     General: No scleral icterus.    Extraocular Movements: Extraocular movements intact.  Cardiovascular:     Rate and Rhythm: Normal rate and regular rhythm.  Pulmonary:     Effort: Pulmonary effort is normal. No respiratory distress.     Breath sounds: Wheezing present. No rhonchi or rales.  Musculoskeletal:     Cervical back: Normal range of motion and neck supple.  Lymphadenopathy:     Cervical: No cervical adenopathy.  Skin:    General: Skin is warm and dry.     Coloration: Skin is not jaundiced or pale.     Findings: No erythema or rash.  Neurological:     Mental Status: She is alert and oriented to person, place, and time.  Psychiatric:        Behavior: Behavior is cooperative.      UC Treatments / Results  Labs (all labs ordered are listed, but only abnormal results are displayed) Labs Reviewed - No data to display  EKG   Radiology DG Chest 2 View Result Date: 05/11/2024 CLINICAL DATA:  Cough 8 days with shortness of breath and congestion. EXAM: CHEST - 2 VIEW COMPARISON:  None Available. FINDINGS: Lungs are adequately inflated without focal airspace consolidation or effusion. There is minimal prominence of the central bronchovascular markings which may be seen with minimal vascular congestion versus viral bronchitic process. Cardiomediastinal silhouette and remainder of the exam is unchanged. IMPRESSION: Minimal prominence of the central bronchovascular markings which may be seen with minimal vascular congestion versus viral bronchitic process. Electronically Signed   By: Toribio Agreste M.D.   On: 05/11/2024 16:53    Procedures Procedures (including critical care time)  Medications Ordered in UC Medications  albuterol (PROVENTIL) (2.5 MG/3ML) 0.083% nebulizer solution 2.5 mg (2.5 mg Nebulization  Given 05/11/24 1608)    Initial Impression / Assessment and Plan / UC Course  I have reviewed the triage vital signs and the nursing notes.  Pertinent labs & imaging results that were available during my care of the patient were reviewed by me and considered in my medical decision making (see chart for details).   Patient is a very pleasant, well-appearing 62 year old Caucasian female presenting today for shortness of breath, productive cough, and chest pain.  Vital signs are stable, patient is overall nontoxic-appearing.  EKG shows no acute ST segment or T wave changes when compared with previous; no STEMI chest x-ray obtained and per my independent review no pneumonia.  There does appear to be some bronchial thickening which is a component of smoking history.  Albuterol breathing treatment was given in urgent care today with improvement in  air movement bilaterally and patient reported subjective improvement.  Start albuterol inhaler at home scheduled every 6 hours, then use as needed.  Also recommend oral prednisone, guaifenesin , cough suppressant medication.  ER and return precautions discussed with patient.  The patient was given the opportunity to ask questions.  All questions answered to their satisfaction.  The patient is in agreement to this plan.   Final Clinical Impressions(s) / UC Diagnoses   Final diagnoses:  Acute cough  Bronchitis with bronchospasm     Discharge Instructions      We will contact you if the chest xray shows pneumonia.  In the meantime, start using albuterol inhaler scheduled every 6 hours for the next 2 days.  Then, use it as needed.  Start taking the Mucinex  twice daily for  congestion, tessalon perles for cough, and azithromycin  to treat for atypical lung infection.  Start taking the oral prednisone to help with lung inflammation.  Seek care if symptoms worsen in the ER.     ED Prescriptions     Medication Sig Dispense Auth. Provider   guaiFENesin   (MUCINEX ) 600 MG 12 hr tablet Take 1 tablet (600 mg total) by mouth 2 (two) times daily. 30 tablet Chandra Raisin A, NP   albuterol (VENTOLIN HFA) 108 (90 Base) MCG/ACT inhaler Inhale 1-2 puffs into the lungs every 6 (six) hours as needed for wheezing or shortness of breath. 6.7 g Chandra Raisin A, NP   azithromycin  (ZITHROMAX ) 250 MG tablet Take 2 tablets by mouth on day 1, then take 1 tablet by mouth on days 2-5. 6 tablet Chandra Raisin A, NP   predniSONE (DELTASONE) 20 MG tablet Take 2 tablets (40 mg total) by mouth daily with breakfast for 5 days. 10 tablet Chandra Raisin A, NP   benzonatate (TESSALON) 100 MG capsule Take 1 capsule (100 mg total) by mouth 3 (three) times daily as needed for cough. Do not take with alcohol or while operating or driving heavy machinery 21 capsule Chandra Raisin LABOR, NP      PDMP not reviewed this encounter.     [1]  Social History Tobacco Use   Smoking status: Former    Current packs/day: 0.00    Average packs/day: 1 pack/day for 30.0 years (30.0 ttl pk-yrs)    Types: Cigarettes    Start date: 10/08/1973    Quit date: 10/09/2003    Years since quitting: 20.6    Passive exposure: Never   Smokeless tobacco: Never  Vaping Use   Vaping status: Never Used  Substance Use Topics   Alcohol use: No    Alcohol/week: 0.0 standard drinks of alcohol   Drug use: Yes    Types: Marijuana    Comment: occassional marijuana     Chandra Raisin LABOR, NP 05/11/24 1725

## 2024-05-11 NOTE — ED Triage Notes (Signed)
 Presenting with productive cough, SOB, labored breathing, and congestion for the last 8 days and getting worse. States no official history of any breathing conditions.

## 2024-05-11 NOTE — Discharge Instructions (Signed)
 We will contact you if the chest xray shows pneumonia.  In the meantime, start using albuterol inhaler scheduled every 6 hours for the next 2 days.  Then, use it as needed.  Start taking the Mucinex  twice daily for congestion, tessalon perles for cough, and azithromycin  to treat for atypical lung infection.  Start taking the oral prednisone to help with lung inflammation.  Seek care if symptoms worsen in the ER.

## 2024-05-26 ENCOUNTER — Other Ambulatory Visit (HOSPITAL_COMMUNITY): Payer: Self-pay

## 2024-05-26 ENCOUNTER — Other Ambulatory Visit: Payer: Self-pay | Admitting: Student

## 2024-05-26 DIAGNOSIS — Z79891 Long term (current) use of opiate analgesic: Secondary | ICD-10-CM

## 2024-05-26 DIAGNOSIS — M351 Other overlap syndromes: Secondary | ICD-10-CM

## 2024-05-26 NOTE — Telephone Encounter (Signed)
 Copied from CRM #[id number]. Topic: Clinical - Medication Refill >> May 26, 2024  9:28 AM [name] wrote: Medication: oxyCODONE -acetaminophen  (PERCOCET/ROXICET) 5-325 MG tablet   Has the patient contacted their pharmacy? Yes (Agent: If no, request that the patient contact the pharmacy for the refill. If patient does not wish to contact the pharmacy document the reason why and proceed with request.) (Agent: If yes, when and what did the pharmacy advise?)  This is the patient's preferred pharmacy:  [facility] [address] [place] [address] Phone: [phone] Fax: [phone]  Is this the correct pharmacy for this prescription? Yes If no, delete pharmacy and type the correct one.   Has the prescription been filled recently? Yes  Is the patient out of the medication? Yes  Has the patient been seen for an appointment in the last year OR does the patient have an upcoming appointment? Yes  Can we respond through MyChart? Yes  Agent: Please be advised that Rx refills may take up to 3 business days. We ask that you follow-up with your pharmacy.

## 2024-05-29 ENCOUNTER — Other Ambulatory Visit (HOSPITAL_COMMUNITY): Payer: Self-pay

## 2024-05-29 ENCOUNTER — Other Ambulatory Visit: Payer: Self-pay

## 2024-05-29 DIAGNOSIS — Z79891 Long term (current) use of opiate analgesic: Secondary | ICD-10-CM

## 2024-05-29 DIAGNOSIS — M351 Other overlap syndromes: Secondary | ICD-10-CM

## 2024-05-29 MED ORDER — OXYCODONE-ACETAMINOPHEN 5-325 MG PO TABS
1.0000 | ORAL_TABLET | Freq: Two times a day (BID) | ORAL | 0 refills | Status: DC | PRN
  Filled 2024-05-29: qty 60, 30d supply, fill #0

## 2024-05-30 ENCOUNTER — Other Ambulatory Visit (HOSPITAL_COMMUNITY): Payer: Self-pay

## 2024-06-26 ENCOUNTER — Ambulatory Visit: Payer: Self-pay

## 2024-06-26 NOTE — Telephone Encounter (Signed)
 FYI Only or Action Required?: FYI only for provider: appointment scheduled on 06/27/24.  Patient was last seen in primary care on 12/20/2023 by Sonya Manos, MD.  Called Nurse Triage reporting Neck Pain.  Symptoms began several weeks ago.  Interventions attempted: OTC medications: Percocet and Prescription medications: tylenol ; ASA.  Symptoms are: gradually worsening.  Triage Disposition: See PCP Within 2 Weeks  Patient/caregiver understands and will follow disposition?: Yes    Message from Sonya Smith sent at 06/26/2024  4:26 PM EST  Reason for Triage: PT has Severe neck pain for 2 weeks/Hard to sleep due to pain     Reason for Disposition  Neck pain is a chronic symptom (recurrent or ongoing AND present > 4 weeks)  Answer Assessment - Initial Assessment Questions Pt called to f/u on letter received to schedule appt with 2 weeks. Pt also is needing refill of Percocet. Discussed neck pain has been constant for the past several weeks causing intermittent h/a. Denies any radiation of pain to back or arms. Pt reports muscular pain throughout body, especially in her neck and knees. Pt also reports she takes tylenol  and ASA with Percocet to increase pain relief. Pt agreeable to see alternative provider. Appointment scheduled for evaluation. Patient agrees with plan of care, and will call back if anything changes, or if symptoms worsen.   Confirmed pharmacy for refills if approved.       1. ONSET: When did the pain begin?      Pt reports having chronic pain, worsening neck pain x several weeks   2. LOCATION: Where does it hurt?      Primarily neck, some radiating causing h/a but no radiation down back or into arms   3. PATTERN Does the pain come and go, or has it been constant since it started?      Constant at this time   4. SEVERITY: How bad is the pain?  (Scale 0-10; or none or slight stiffness, mild, moderate, severe)     Severe   5. RADIATION: Does the pain go  anywhere else, shoot into your arms?     No   6. CORD SYMPTOMS: Any weakness or numbness of the arms or legs?     No   7. CAUSE: What do you think is causing the neck pain?     Chronic pain  8. NECK OVERUSE: Any recent activities that involved turning or twisting the neck?     No   9. OTHER SYMPTOMS: Do you have any other symptoms? (e.g., headache, fever, chest pain, difficulty breathing, neck swelling)     Intermittent h/a  Protocols used: Neck Pain or Stiffness-A-AH

## 2024-06-27 ENCOUNTER — Encounter: Payer: Self-pay | Admitting: Student

## 2024-06-27 ENCOUNTER — Other Ambulatory Visit: Payer: Self-pay | Admitting: *Deleted

## 2024-06-27 ENCOUNTER — Ambulatory Visit: Payer: Self-pay | Admitting: Student

## 2024-06-27 ENCOUNTER — Other Ambulatory Visit (HOSPITAL_COMMUNITY): Payer: Self-pay

## 2024-06-27 VITALS — BP 129/77 | HR 63 | Ht 65.0 in | Wt 239.0 lb

## 2024-06-27 DIAGNOSIS — Z79891 Long term (current) use of opiate analgesic: Secondary | ICD-10-CM

## 2024-06-27 DIAGNOSIS — M542 Cervicalgia: Secondary | ICD-10-CM | POA: Diagnosis present

## 2024-06-27 DIAGNOSIS — I10 Essential (primary) hypertension: Secondary | ICD-10-CM

## 2024-06-27 DIAGNOSIS — M351 Other overlap syndromes: Secondary | ICD-10-CM

## 2024-06-27 MED ORDER — CYCLOBENZAPRINE HCL 5 MG PO TABS
5.0000 mg | ORAL_TABLET | Freq: Three times a day (TID) | ORAL | 0 refills | Status: AC | PRN
  Filled 2024-06-27: qty 30, 10d supply, fill #0

## 2024-06-27 NOTE — Patient Instructions (Signed)
 Sonya Smith, Thank you for allowing me to take part in your care today.  Here are your instructions.  1. I have referred you to PT, they will call you to schedule this   2. Please take the flexeril  at night first to see how it makes you feel before starting to take it in the day. If you start feeling better stop taking the flexeril  and save it for a bad day.    PLEASE BRING YOUR MEDICATIONS TO EVERY APPOINTMENT  Thank you, Dr. Tobie  If you have any other questions please contact the internal medicine clinic at [phone] If it is after hours, please call the  hospital at [phone] and then ask the person who picks up for the resident on call.

## 2024-06-27 NOTE — Assessment & Plan Note (Signed)
 Patient has a past medical history of hypertension.  Medications include olmesartan  5 mg daily, and amlodipine  5 mg daily.  Blood pressure today at goal at 129/77.  No acute concerns at this time.  Plan: - Continue olmesartan  5 mg daily - Continue amlodipine  5 mg daily

## 2024-06-27 NOTE — Progress Notes (Deleted)
" ° °  Established Patient Office Visit  Subjective   Patient ID: Sonya Smith, female    DOB: [birth year]  Age: 64 y.o. MRN: [id number]  No chief complaint on file.   Sonya Smith is a 64 y.o. who presents to the clinic for ***. Please see problem based assessment and plan for additional details.   Acute visit: Neck pain -when -side -Neuropathy sx?  -Prior? -Meds -Ice/heat  {History (Optional):23778}  ROS    Objective:     LMP 07/07/2015  {Vitals History (Optional):23777}  Physical Exam   No results found for any visits on 06/27/24.  {Labs (Optional):23779}  The 10-year ASCVD risk score (Arnett DK, et al., 2019) is: 7.1%    Assessment & Plan:   Problem List Items Addressed This Visit   None   No follow-ups on file.    Damien Lease, DO  "

## 2024-06-27 NOTE — Telephone Encounter (Signed)
 Last rx written - 05/29/25. Next OV- Today 06/27/24. TOX - 08/16/21.

## 2024-06-27 NOTE — Progress Notes (Signed)
 "  CC: Neck pain  HPI:  Sonya Smith is a 64 y.o. female with a history of hypertension, chronic pain on chronic Percocet who presents to the clinic with concerns of neck pain.  Please see assessment and plan for full HPI.  Past Medical History:  Diagnosis Date   Anxiety 10/09/2010   Arthritis    of cervical and lumbar spine   Asthma    undiagnosed, was told by a physician in the ED, patient does not take any inhalers at home.    Chest pain    multiple ED visit, no objective evidence of cardiac or pulmonary causes  of chest pain in past   Hypertension 04/06/2019   Mixed connective tissue disease    Restless leg syndrome 10/14/2017    Current Medications[1]  Review of Systems:    MSK: Patient endorses neck pain  Physical Exam:  Vitals:   06/27/24 1432  BP: 129/77  Pulse: 63  TempSrc: Oral  SpO2: 96%  Weight: 239 lb (108.4 kg)  Height: 5' 5 (1.651 m)   General: Patient is sitting comfortably in the room  Head: Normocephalic, atraumatic  Neck: Decreased active and passive range of motion secondary to pain.  Negative Spurling's test.  No obvious deformities  Cardio: Regular rate and rhythm, no murmurs, rubs or gallops Pulmonary: Clear to ausculation bilaterally with no rales, rhonchi, and crackles   Assessment & Plan:   Assessment & Plan Neck pain Patient has concerns of subacute neck pain.  She has a history of chronic neck pain.  A few weeks ago patient had a tree fall in her driveway.  She was picking up the logs and noticed that she had strained her neck.  She states she has been having pain since.  She describes it to be soreness in nature.  She has spasms.  She has trouble sleeping at night.  She tried Tylenol  with little improvement.  She takes Percocet chronically, which does help helped.  However pain is worsened.  She states decreased range of motion of her neck now.  She denies any numbness or tingling.  No sensation losses.  No fevers or chills.  On my  exam, patient has negative Spurling's test.  Patient also has decreased range of motion secondary to pain actively and passively.  Likely these are cervical paraspinal muscle spasms.  Will try Flexeril  and send to physical therapy.  No imaging required at this time.  If patient does develop neurological symptoms, would recommend obtaining CT or MRI.  Plan: - Refer to physical therapy - Start Flexeril  5 mg 3 times daily as needed  Primary hypertension Patient has a past medical history of hypertension.  Medications include olmesartan  5 mg daily, and amlodipine  5 mg daily.  Blood pressure today at goal at 129/77.  No acute concerns at this time.  Plan: - Continue olmesartan  5 mg daily - Continue amlodipine  5 mg daily  Patient discussed with Dr. Rosan Libby Blanch, DO Internal Medicine Resident PGY-3     [1]  Current Outpatient Medications:    cyclobenzaprine  (FLEXERIL ) 5 MG tablet, Take 1 tablet (5 mg total) by mouth 3 (three) times daily as needed for muscle spasms., Disp: 30 tablet, Rfl: 0   albuterol  (VENTOLIN  HFA) 108 (90 Base) MCG/ACT inhaler, Inhale 1-2 puffs into the lungs every 6 (six) hours as needed for wheezing or shortness of breath., Disp: 6.7 g, Rfl: 0   amLODipine  (NORVASC ) 5 MG tablet, Take 1 tablet (5 mg total)  by mouth daily., Disp: 30 tablet, Rfl: 11   aspirin  EC 81 MG tablet, Take 81 mg by mouth daily as needed for mild pain., Disp: , Rfl:    azithromycin  (ZITHROMAX ) 250 MG tablet, Take 2 tablets by mouth on day 1, then take 1 tablet by mouth on days 2-5., Disp: 6 tablet, Rfl: 0   benzonatate  (TESSALON ) 100 MG capsule, Take 1 capsule (100 mg total) by mouth 3 (three) times daily as needed for cough. Do not take with alcohol or while operating or driving heavy machinery, Disp: 21 capsule, Rfl: 0   diazepam  (VALIUM ) 2 MG tablet, Take 1 tablet (2 mg total) by mouth every 6 (six) hours as needed for muscle spasms., Disp: 15 tablet, Rfl: 0   guaiFENesin  (MUCINEX ) 600 MG 12  hr tablet, Take 1 tablet (600 mg total) by mouth 2 (two) times daily., Disp: 30 tablet, Rfl: 0   ibuprofen  (ADVIL ) 200 MG tablet, Take 400 mg by mouth every 6 (six) hours as needed for moderate pain., Disp: , Rfl:    Multiple Vitamins-Minerals (MULTIVITAMIN WITH MINERALS) tablet, Take 1 tablet by mouth daily., Disp: , Rfl:    olmesartan  (BENICAR ) 5 MG tablet, Take 1 tablet (5 mg total) by mouth daily., Disp: 90 tablet, Rfl: 3   oxyCODONE -acetaminophen  (PERCOCET/ROXICET) 5-325 MG tablet, Take 1 tablet by mouth 2 (two) times daily as needed for severe pain (pain score 7-10)., Disp: 60 tablet, Rfl: 0  "

## 2024-06-27 NOTE — Assessment & Plan Note (Signed)
 Patient has concerns of subacute neck pain.  She has a history of chronic neck pain.  A few weeks ago patient had a tree fall in her driveway.  She was picking up the logs and noticed that she had strained her neck.  She states she has been having pain since.  She describes it to be soreness in nature.  She has spasms.  She has trouble sleeping at night.  She tried Tylenol  with little improvement.  She takes Percocet chronically, which does help helped.  However pain is worsened.  She states decreased range of motion of her neck now.  She denies any numbness or tingling.  No sensation losses.  No fevers or chills.  On my exam, patient has negative Spurling's test.  Patient also has decreased range of motion secondary to pain actively and passively.  Likely these are cervical paraspinal muscle spasms.  Will try Flexeril  and send to physical therapy.  No imaging required at this time.  If patient does develop neurological symptoms, would recommend obtaining CT or MRI.  Plan: - Refer to physical therapy - Start Flexeril  5 mg 3 times daily as needed

## 2024-06-27 NOTE — Telephone Encounter (Signed)
 Call to patient CMA attempted to confirm appt scheduled for today at 2:45pm with Dr Kandis to follow up neck pain  No answer-message with appt time/date on left on recorder

## 2024-06-27 NOTE — Telephone Encounter (Signed)
 Copied from CRM #[id number]. Topic: Clinical - Medication Refill >> Jun 26, 2024  4:21 PM [name] wrote: Medication: oxyCODONE -acetaminophen  (PERCOCET/ROXICET) 5-325 MG tablet olmesartan  (BENICAR ) 5 MG tablet amLODipine  (NORVASC ) 5 MG tablet   Has the patient contacted their pharmacy? Yes (Agent: If no, request that the patient contact the pharmacy for the refill. If patient does not wish to contact the pharmacy document the reason why and proceed with request.) (Agent: If yes, when and what did the pharmacy advise?)  This is the patient's preferred pharmacy:  [facility] [address] [place] [address] Phone: [phone] Fax: [phone]  Is this the correct pharmacy for this prescription? Yes If no, delete pharmacy and type the correct one.   Has the prescription been filled recently? Yes  Is the patient out of the medication? no  Has the patient been seen for an appointment in the last year OR does the patient have an upcoming appointment? Yes  Can we respond through MyChart? Yes  Agent: Please be advised that Rx refills may take up to 3 business days. We ask that you follow-up with your pharmacy.

## 2024-06-28 ENCOUNTER — Other Ambulatory Visit (HOSPITAL_COMMUNITY): Payer: Self-pay

## 2024-06-28 ENCOUNTER — Other Ambulatory Visit: Payer: Self-pay | Admitting: Student

## 2024-06-28 DIAGNOSIS — M351 Other overlap syndromes: Secondary | ICD-10-CM

## 2024-06-28 DIAGNOSIS — Z79891 Long term (current) use of opiate analgesic: Secondary | ICD-10-CM

## 2024-06-28 MED ORDER — OXYCODONE-ACETAMINOPHEN 5-325 MG PO TABS
1.0000 | ORAL_TABLET | Freq: Two times a day (BID) | ORAL | 0 refills | Status: AC | PRN
  Filled 2024-06-28: qty 60, 30d supply, fill #0

## 2024-06-29 ENCOUNTER — Other Ambulatory Visit (HOSPITAL_COMMUNITY): Payer: Self-pay

## 2024-06-29 ENCOUNTER — Other Ambulatory Visit: Payer: Self-pay

## 2024-07-01 NOTE — Progress Notes (Signed)
 Internal Medicine Clinic Attending  Case discussed with the resident at the time of the visit.  We reviewed the resident's history and exam and pertinent patient test results.  I agree with the assessment, diagnosis, and plan of care documented in the resident's note.

## 2024-07-05 NOTE — Telephone Encounter (Signed)
 Oxycodone  refill has been done.
